# Patient Record
Sex: Male | Born: 1935
Health system: Southern US, Community
[De-identification: ages and names within clinical notes are randomized; demographics above are authoritative.]

## PROBLEM LIST (undated history)

## (undated) DIAGNOSIS — L309 Dermatitis, unspecified: Secondary | ICD-10-CM

## (undated) DIAGNOSIS — Z9289 Personal history of other medical treatment: Secondary | ICD-10-CM

## (undated) DIAGNOSIS — R32 Unspecified urinary incontinence: Secondary | ICD-10-CM

## (undated) DIAGNOSIS — C61 Malignant neoplasm of prostate: Secondary | ICD-10-CM

## (undated) DIAGNOSIS — C189 Malignant neoplasm of colon, unspecified: Secondary | ICD-10-CM

## (undated) DIAGNOSIS — K5792 Diverticulitis of intestine, part unspecified, without perforation or abscess without bleeding: Secondary | ICD-10-CM

## (undated) DIAGNOSIS — N183 Chronic kidney disease, stage 3 unspecified: Secondary | ICD-10-CM

## (undated) DIAGNOSIS — L719 Rosacea, unspecified: Secondary | ICD-10-CM

## (undated) DIAGNOSIS — R339 Retention of urine, unspecified: Secondary | ICD-10-CM

## (undated) DIAGNOSIS — M1990 Unspecified osteoarthritis, unspecified site: Secondary | ICD-10-CM

## (undated) DIAGNOSIS — M109 Gout, unspecified: Secondary | ICD-10-CM

## (undated) DIAGNOSIS — M5430 Sciatica, unspecified side: Secondary | ICD-10-CM

## (undated) DIAGNOSIS — I2699 Other pulmonary embolism without acute cor pulmonale: Secondary | ICD-10-CM

## (undated) DIAGNOSIS — I739 Peripheral vascular disease, unspecified: Secondary | ICD-10-CM

## (undated) DIAGNOSIS — Z972 Presence of dental prosthetic device (complete) (partial): Secondary | ICD-10-CM

## (undated) DIAGNOSIS — K649 Unspecified hemorrhoids: Secondary | ICD-10-CM

## (undated) DIAGNOSIS — I10 Essential (primary) hypertension: Secondary | ICD-10-CM

## (undated) DIAGNOSIS — Z973 Presence of spectacles and contact lenses: Secondary | ICD-10-CM

## (undated) HISTORY — DX: Other pulmonary embolism without acute cor pulmonale: I26.99

## (undated) HISTORY — DX: Sciatica, unspecified side: M54.30

## (undated) HISTORY — DX: Essential (primary) hypertension: I10

## (undated) HISTORY — DX: Unspecified urinary incontinence: R32

## (undated) HISTORY — DX: Dermatitis, unspecified: L30.9

## (undated) HISTORY — DX: Retention of urine, unspecified: R33.9

## (undated) HISTORY — PX: SHOULDER SURGERY: SHX246

## (undated) HISTORY — DX: Chronic kidney disease, stage 3 unspecified: N18.30

## (undated) HISTORY — DX: Malignant neoplasm of colon, unspecified: C18.9

## (undated) HISTORY — DX: Rosacea, unspecified: L71.9

## (undated) HISTORY — PX: ILEOSTOMY: SHX1783

## (undated) HISTORY — PX: OTHER SURGICAL HISTORY: SHX169

## (undated) HISTORY — PX: TONSILLECTOMY: SUR1361

## (undated) HISTORY — PX: COLON SURGERY: SHX602

## (undated) HISTORY — PX: EYE SURGERY: SHX253

---

## 1990-10-23 HISTORY — PX: OTHER SURGICAL HISTORY: SHX169

## 1997-10-23 HISTORY — PX: HERNIA REPAIR: SHX51

## 1999-07-12 ENCOUNTER — Other Ambulatory Visit: Admission: RE | Admit: 1999-07-12 | Discharge: 1999-07-12 | Payer: Self-pay

## 2000-12-31 ENCOUNTER — Encounter (INDEPENDENT_AMBULATORY_CARE_PROVIDER_SITE_OTHER): Payer: Self-pay | Admitting: *Deleted

## 2000-12-31 ENCOUNTER — Ambulatory Visit (HOSPITAL_COMMUNITY): Admission: RE | Admit: 2000-12-31 | Discharge: 2000-12-31 | Payer: Self-pay | Admitting: Gastroenterology

## 2001-08-15 ENCOUNTER — Ambulatory Visit: Admission: RE | Admit: 2001-08-15 | Discharge: 2001-11-13 | Payer: Self-pay | Admitting: Urology

## 2001-11-14 ENCOUNTER — Ambulatory Visit: Admission: RE | Admit: 2001-11-14 | Discharge: 2002-02-12 | Payer: Self-pay | Admitting: Radiation Oncology

## 2002-11-10 ENCOUNTER — Ambulatory Visit: Admission: RE | Admit: 2002-11-10 | Discharge: 2002-11-10 | Payer: Self-pay

## 2003-10-26 ENCOUNTER — Encounter: Admission: RE | Admit: 2003-10-26 | Discharge: 2003-10-26 | Payer: Self-pay

## 2003-11-09 ENCOUNTER — Encounter: Admission: RE | Admit: 2003-11-09 | Discharge: 2003-11-09 | Payer: Self-pay

## 2003-11-17 ENCOUNTER — Encounter (INDEPENDENT_AMBULATORY_CARE_PROVIDER_SITE_OTHER): Payer: Self-pay | Admitting: *Deleted

## 2003-11-17 ENCOUNTER — Ambulatory Visit (HOSPITAL_COMMUNITY): Admission: RE | Admit: 2003-11-17 | Discharge: 2003-11-17 | Payer: Self-pay | Admitting: Gastroenterology

## 2003-12-08 ENCOUNTER — Encounter: Admission: RE | Admit: 2003-12-08 | Discharge: 2003-12-08 | Payer: Self-pay

## 2006-08-13 ENCOUNTER — Ambulatory Visit: Payer: Self-pay | Admitting: Internal Medicine

## 2006-08-13 LAB — CONVERTED CEMR LAB
CO2: 30 meq/L (ref 19–32)
Chol/HDL Ratio, serum: 4.5
Cholesterol: 151 mg/dL (ref 0–200)
Glomerular Filtration Rate, Af Am: 59 mL/min/{1.73_m2}
Glucose, Bld: 97 mg/dL (ref 70–99)
Potassium: 3.8 meq/L (ref 3.5–5.1)
Triglyceride fasting, serum: 99 mg/dL (ref 0–149)

## 2006-11-16 ENCOUNTER — Ambulatory Visit: Payer: Self-pay | Admitting: Internal Medicine

## 2006-11-16 LAB — CONVERTED CEMR LAB
ALT: 26 units/L (ref 0–40)
AST: 23 units/L (ref 0–37)
BUN: 26 mg/dL — ABNORMAL HIGH (ref 6–23)
GFR calc Af Amer: 55 mL/min
LDL Cholesterol: 92 mg/dL (ref 0–99)
Potassium: 3.9 meq/L (ref 3.5–5.1)
Total CHOL/HDL Ratio: 4.2
Triglycerides: 107 mg/dL (ref 0–149)
VLDL: 21 mg/dL (ref 0–40)

## 2007-01-23 ENCOUNTER — Encounter: Payer: Self-pay | Admitting: Internal Medicine

## 2007-01-23 ENCOUNTER — Ambulatory Visit: Payer: Self-pay | Admitting: Internal Medicine

## 2007-01-23 DIAGNOSIS — K222 Esophageal obstruction: Secondary | ICD-10-CM

## 2007-01-23 DIAGNOSIS — Z8546 Personal history of malignant neoplasm of prostate: Secondary | ICD-10-CM

## 2007-04-17 ENCOUNTER — Ambulatory Visit: Payer: Self-pay | Admitting: Internal Medicine

## 2007-04-17 ENCOUNTER — Encounter: Admission: RE | Admit: 2007-04-17 | Discharge: 2007-04-17 | Payer: Self-pay | Admitting: Internal Medicine

## 2007-04-17 DIAGNOSIS — R05 Cough: Secondary | ICD-10-CM | POA: Insufficient documentation

## 2007-04-19 LAB — CONVERTED CEMR LAB
Chloride: 102 meq/L (ref 96–112)
GFR calc Af Amer: 55 mL/min
GFR calc non Af Amer: 45 mL/min
Glucose, Bld: 81 mg/dL (ref 70–99)
Sodium: 141 meq/L (ref 135–145)

## 2007-07-25 ENCOUNTER — Encounter: Payer: Self-pay | Admitting: Internal Medicine

## 2008-05-11 ENCOUNTER — Ambulatory Visit: Payer: Self-pay | Admitting: Internal Medicine

## 2008-05-11 DIAGNOSIS — N189 Chronic kidney disease, unspecified: Secondary | ICD-10-CM

## 2008-05-11 DIAGNOSIS — I10 Essential (primary) hypertension: Secondary | ICD-10-CM | POA: Insufficient documentation

## 2008-05-11 DIAGNOSIS — M109 Gout, unspecified: Secondary | ICD-10-CM

## 2008-05-12 ENCOUNTER — Ambulatory Visit: Payer: Self-pay | Admitting: Internal Medicine

## 2008-05-18 LAB — CONVERTED CEMR LAB
BUN: 19 mg/dL (ref 6–23)
Basophils Relative: 0.3 % (ref 0.0–3.0)
CO2: 28 meq/L (ref 19–32)
Chloride: 105 meq/L (ref 96–112)
Creatinine, Ser: 1.4 mg/dL (ref 0.4–1.5)
Eosinophils Absolute: 0.2 10*3/uL (ref 0.0–0.7)
Eosinophils Relative: 4.6 % (ref 0.0–5.0)
Glucose, Bld: 98 mg/dL (ref 70–99)
Lymphocytes Relative: 22.9 % (ref 12.0–46.0)
MCV: 98.9 fL (ref 78.0–100.0)
Monocytes Relative: 7 % (ref 3.0–12.0)
Neutrophils Relative %: 65.2 % (ref 43.0–77.0)
RBC: 4.42 M/uL (ref 4.22–5.81)
VLDL: 13 mg/dL (ref 0–40)
WBC: 5.4 10*3/uL (ref 4.5–10.5)

## 2008-06-25 ENCOUNTER — Ambulatory Visit: Payer: Self-pay | Admitting: Internal Medicine

## 2008-11-06 ENCOUNTER — Ambulatory Visit: Payer: Self-pay | Admitting: Family Medicine

## 2008-11-18 ENCOUNTER — Encounter: Payer: Self-pay | Admitting: Internal Medicine

## 2008-12-01 ENCOUNTER — Encounter: Payer: Self-pay | Admitting: Internal Medicine

## 2008-12-02 ENCOUNTER — Encounter: Payer: Self-pay | Admitting: Internal Medicine

## 2008-12-04 ENCOUNTER — Ambulatory Visit (HOSPITAL_COMMUNITY): Admission: RE | Admit: 2008-12-04 | Discharge: 2008-12-04 | Payer: Self-pay | Admitting: Urology

## 2008-12-04 ENCOUNTER — Encounter: Payer: Self-pay | Admitting: Internal Medicine

## 2008-12-21 ENCOUNTER — Ambulatory Visit (HOSPITAL_COMMUNITY): Admission: RE | Admit: 2008-12-21 | Discharge: 2008-12-21 | Payer: Self-pay | Admitting: Gastroenterology

## 2008-12-21 ENCOUNTER — Encounter (INDEPENDENT_AMBULATORY_CARE_PROVIDER_SITE_OTHER): Payer: Self-pay | Admitting: Gastroenterology

## 2009-01-20 ENCOUNTER — Telehealth (INDEPENDENT_AMBULATORY_CARE_PROVIDER_SITE_OTHER): Payer: Self-pay | Admitting: *Deleted

## 2009-03-30 ENCOUNTER — Emergency Department (HOSPITAL_COMMUNITY): Admission: EM | Admit: 2009-03-30 | Discharge: 2009-03-30 | Payer: Self-pay | Admitting: Emergency Medicine

## 2009-04-07 ENCOUNTER — Encounter: Admission: RE | Admit: 2009-04-07 | Discharge: 2009-04-07 | Payer: Self-pay | Admitting: Gastroenterology

## 2009-04-16 ENCOUNTER — Encounter: Payer: Self-pay | Admitting: Internal Medicine

## 2009-05-12 ENCOUNTER — Ambulatory Visit: Payer: Self-pay | Admitting: Internal Medicine

## 2009-05-12 DIAGNOSIS — M2550 Pain in unspecified joint: Secondary | ICD-10-CM | POA: Insufficient documentation

## 2009-05-12 DIAGNOSIS — L719 Rosacea, unspecified: Secondary | ICD-10-CM

## 2009-05-13 ENCOUNTER — Ambulatory Visit: Payer: Self-pay | Admitting: Internal Medicine

## 2009-05-14 LAB — CONVERTED CEMR LAB
Rhuematoid fact SerPl-aCnc: <20 intl units/mL (ref 0.0–20.0)
Sed Rate: 7 mm/hr (ref 0–22)
Uric Acid, Serum: 5.7 mg/dL (ref 4.0–7.8)

## 2009-05-17 ENCOUNTER — Encounter (INDEPENDENT_AMBULATORY_CARE_PROVIDER_SITE_OTHER): Payer: Self-pay | Admitting: *Deleted

## 2009-05-19 ENCOUNTER — Telehealth: Payer: Self-pay | Admitting: Internal Medicine

## 2009-06-21 ENCOUNTER — Ambulatory Visit: Payer: Self-pay | Admitting: Internal Medicine

## 2009-06-21 DIAGNOSIS — K5730 Diverticulosis of large intestine without perforation or abscess without bleeding: Secondary | ICD-10-CM | POA: Insufficient documentation

## 2009-06-22 ENCOUNTER — Ambulatory Visit: Payer: Self-pay | Admitting: Internal Medicine

## 2009-06-29 ENCOUNTER — Encounter: Payer: Self-pay | Admitting: Internal Medicine

## 2009-06-30 ENCOUNTER — Encounter (INDEPENDENT_AMBULATORY_CARE_PROVIDER_SITE_OTHER): Payer: Self-pay | Admitting: *Deleted

## 2009-06-30 LAB — CONVERTED CEMR LAB
ALT: 18 units/L (ref 0–53)
AST: 20 units/L (ref 0–37)
BUN: 20 mg/dL (ref 6–23)
Basophils Absolute: 0 10*3/uL (ref 0.0–0.1)
Basophils Relative: 0.1 % (ref 0.0–3.0)
CO2: 26 meq/L (ref 19–32)
Calcium: 9.7 mg/dL (ref 8.4–10.5)
Cholesterol: 123 mg/dL (ref 0–200)
Creatinine, Ser: 1.3 mg/dL (ref 0.4–1.5)
Eosinophils Absolute: 0.2 10*3/uL (ref 0.0–0.7)
HCT: 43.4 % (ref 39.0–52.0)
Hemoglobin: 14.3 g/dL (ref 13.0–17.0)
Lymphs Abs: 1.2 10*3/uL (ref 0.7–4.0)
MCHC: 33 g/dL (ref 30.0–36.0)
Neutro Abs: 6.2 10*3/uL (ref 1.4–7.7)
RBC: 4.45 M/uL (ref 4.22–5.81)
RDW: 13.7 % (ref 11.5–14.6)
Triglycerides: 74 mg/dL (ref 0.0–149.0)

## 2009-07-01 ENCOUNTER — Encounter (INDEPENDENT_AMBULATORY_CARE_PROVIDER_SITE_OTHER): Payer: Self-pay | Admitting: *Deleted

## 2009-07-13 ENCOUNTER — Encounter: Payer: Self-pay | Admitting: Internal Medicine

## 2009-08-06 ENCOUNTER — Encounter: Payer: Self-pay | Admitting: Internal Medicine

## 2009-08-23 ENCOUNTER — Encounter: Admission: RE | Admit: 2009-08-23 | Discharge: 2009-08-23 | Payer: Self-pay | Admitting: General Surgery

## 2009-09-03 ENCOUNTER — Encounter: Payer: Self-pay | Admitting: Internal Medicine

## 2009-09-24 ENCOUNTER — Encounter: Payer: Self-pay | Admitting: Internal Medicine

## 2009-09-29 ENCOUNTER — Encounter (HOSPITAL_COMMUNITY): Admission: RE | Admit: 2009-09-29 | Discharge: 2009-10-22 | Payer: Self-pay | Admitting: Urology

## 2009-10-11 ENCOUNTER — Encounter: Payer: Self-pay | Admitting: Internal Medicine

## 2009-10-12 ENCOUNTER — Telehealth (INDEPENDENT_AMBULATORY_CARE_PROVIDER_SITE_OTHER): Payer: Self-pay | Admitting: *Deleted

## 2009-10-23 DIAGNOSIS — I739 Peripheral vascular disease, unspecified: Secondary | ICD-10-CM

## 2009-10-23 HISTORY — DX: Peripheral vascular disease, unspecified: I73.9

## 2009-10-27 ENCOUNTER — Encounter: Payer: Self-pay | Admitting: Internal Medicine

## 2009-10-28 ENCOUNTER — Encounter: Admission: RE | Admit: 2009-10-28 | Discharge: 2009-10-28 | Payer: Self-pay | Admitting: General Surgery

## 2009-11-02 ENCOUNTER — Encounter (INDEPENDENT_AMBULATORY_CARE_PROVIDER_SITE_OTHER): Payer: Self-pay | Admitting: General Surgery

## 2009-11-02 ENCOUNTER — Inpatient Hospital Stay (HOSPITAL_COMMUNITY): Admission: RE | Admit: 2009-11-02 | Discharge: 2009-11-08 | Payer: Self-pay | Admitting: General Surgery

## 2009-11-18 ENCOUNTER — Encounter: Payer: Self-pay | Admitting: Internal Medicine

## 2009-12-06 ENCOUNTER — Encounter: Admission: RE | Admit: 2009-12-06 | Discharge: 2009-12-06 | Payer: Self-pay | Admitting: General Surgery

## 2009-12-20 ENCOUNTER — Encounter: Admission: RE | Admit: 2009-12-20 | Discharge: 2009-12-20 | Payer: Self-pay | Admitting: General Surgery

## 2009-12-21 ENCOUNTER — Encounter: Payer: Self-pay | Admitting: Internal Medicine

## 2010-01-26 ENCOUNTER — Encounter: Payer: Self-pay | Admitting: Internal Medicine

## 2010-02-04 ENCOUNTER — Encounter: Admission: RE | Admit: 2010-02-04 | Discharge: 2010-02-04 | Payer: Self-pay | Admitting: General Surgery

## 2010-02-11 ENCOUNTER — Encounter: Payer: Self-pay | Admitting: Internal Medicine

## 2010-03-24 ENCOUNTER — Encounter: Admission: RE | Admit: 2010-03-24 | Discharge: 2010-03-24 | Payer: Self-pay | Admitting: General Surgery

## 2010-04-07 ENCOUNTER — Encounter: Payer: Self-pay | Admitting: Internal Medicine

## 2010-04-13 ENCOUNTER — Ambulatory Visit (HOSPITAL_COMMUNITY): Admission: RE | Admit: 2010-04-13 | Discharge: 2010-04-13 | Payer: Self-pay | Admitting: General Surgery

## 2010-04-20 ENCOUNTER — Ambulatory Visit (HOSPITAL_COMMUNITY): Admission: RE | Admit: 2010-04-20 | Discharge: 2010-04-20 | Payer: Self-pay | Admitting: General Surgery

## 2010-05-10 ENCOUNTER — Telehealth (INDEPENDENT_AMBULATORY_CARE_PROVIDER_SITE_OTHER): Payer: Self-pay | Admitting: *Deleted

## 2010-05-10 ENCOUNTER — Encounter: Admission: RE | Admit: 2010-05-10 | Discharge: 2010-05-10 | Payer: Self-pay | Admitting: General Surgery

## 2010-05-18 ENCOUNTER — Ambulatory Visit (HOSPITAL_COMMUNITY): Admission: RE | Admit: 2010-05-18 | Discharge: 2010-05-18 | Payer: Self-pay | Admitting: General Surgery

## 2010-05-18 ENCOUNTER — Encounter: Payer: Self-pay | Admitting: Internal Medicine

## 2010-05-24 ENCOUNTER — Encounter: Payer: Self-pay | Admitting: Internal Medicine

## 2010-05-24 ENCOUNTER — Telehealth: Payer: Self-pay | Admitting: Internal Medicine

## 2010-05-25 ENCOUNTER — Encounter: Admission: RE | Admit: 2010-05-25 | Discharge: 2010-05-25 | Payer: Self-pay | Admitting: Internal Medicine

## 2010-05-26 ENCOUNTER — Encounter: Payer: Self-pay | Admitting: Internal Medicine

## 2010-05-26 LAB — CONVERTED CEMR LAB
ALT: 23 units/L
AST: 23 units/L
Alkaline Phosphatase: 80 units/L
BUN: 37 mg/dL
Creatinine, Ser: 2.07 mg/dL

## 2010-05-30 ENCOUNTER — Telehealth: Payer: Self-pay | Admitting: Internal Medicine

## 2010-06-03 ENCOUNTER — Ambulatory Visit: Payer: Self-pay | Admitting: Internal Medicine

## 2010-06-03 LAB — CONVERTED CEMR LAB
Basophils Absolute: 0 10*3/uL (ref 0.0–0.1)
Basophils Relative: 0 % (ref 0–1)
CO2: 25 meq/L (ref 19–32)
Chloride: 107 meq/L (ref 96–112)
Hemoglobin: 11.8 g/dL — ABNORMAL LOW (ref 13.0–17.0)
Lymphocytes Relative: 15 % (ref 12–46)
MCHC: 32.7 g/dL (ref 30.0–36.0)
Monocytes Absolute: 0.5 10*3/uL (ref 0.1–1.0)
Neutro Abs: 5.6 10*3/uL (ref 1.7–7.7)
Neutrophils Relative %: 74 % (ref 43–77)
Potassium: 3.9 meq/L (ref 3.5–5.3)
RDW: 14.9 % (ref 11.5–15.5)
Sodium: 138 meq/L (ref 135–145)

## 2010-06-06 ENCOUNTER — Telehealth: Payer: Self-pay | Admitting: Internal Medicine

## 2010-06-20 ENCOUNTER — Encounter: Payer: Self-pay | Admitting: Internal Medicine

## 2010-06-28 ENCOUNTER — Encounter (INDEPENDENT_AMBULATORY_CARE_PROVIDER_SITE_OTHER): Payer: Self-pay | Admitting: General Surgery

## 2010-06-28 ENCOUNTER — Inpatient Hospital Stay (HOSPITAL_COMMUNITY): Admission: RE | Admit: 2010-06-28 | Discharge: 2010-07-02 | Payer: Self-pay | Admitting: General Surgery

## 2010-07-12 ENCOUNTER — Encounter: Payer: Self-pay | Admitting: Internal Medicine

## 2010-08-25 ENCOUNTER — Encounter: Payer: Self-pay | Admitting: Internal Medicine

## 2010-09-09 ENCOUNTER — Encounter: Payer: Self-pay | Admitting: Internal Medicine

## 2010-09-30 ENCOUNTER — Ambulatory Visit: Payer: Self-pay | Admitting: Internal Medicine

## 2010-09-30 DIAGNOSIS — R229 Localized swelling, mass and lump, unspecified: Secondary | ICD-10-CM

## 2010-10-07 ENCOUNTER — Encounter: Payer: Self-pay | Admitting: Internal Medicine

## 2010-11-02 ENCOUNTER — Encounter: Payer: Self-pay | Admitting: Internal Medicine

## 2010-11-11 ENCOUNTER — Encounter
Admission: RE | Admit: 2010-11-11 | Discharge: 2010-11-11 | Payer: Self-pay | Source: Home / Self Care | Attending: General Surgery | Admitting: General Surgery

## 2010-11-13 ENCOUNTER — Encounter: Payer: Self-pay | Admitting: General Surgery

## 2010-11-24 NOTE — Progress Notes (Signed)
Summary: renal failure, needs workup  Phone Note Outgoing Call   Summary of Call: phone call fromDr. Derrell Lolling, surgery The patient is in need for reverse colostomy He obtained a BNP, his creatinine was 2.0 Compared to previous labs a year ago, the creatinine has increased from a baseline of 1.3. Plan: Renal ultrasound ASAP hold enalapril Schedule the patient to see me this week or next week we mainly to hold the elective reversion of his colostomy until we figure out his renal failure [name] E. Paz MD  May 24, 2010 9:27 AM     Follow-up for Phone Call        Pt is aware of all instructions. He is going to get his [place] and make an appt to see Dr. Drue Novel. will hold enalapril.    New/Updated Medications: ENALAPRIL MALEATE 10 MG  TABS (ENALAPRIL MALEATE) 1 by mouth once daily- HOLD

## 2010-11-24 NOTE — Progress Notes (Signed)
Summary: FOUR 90 DAY PRESCRIPTIONS  Phone Note Call from Patient Call back at Home Phone [phone]   Caller: Patient Summary of Call: PATIENT DROPPED OFF [facility] FORMS FOR 90 DAY PRESCRIPTIONS PLUS REFILLS FOR :  1) ALLOPURINOL 300 MG 2) ENALAPRIL MAL TABS 10MG  3) POT CHLOR ER TABS 20 MEQ 4) HYDROCHLOROTHIAZIDE TABS 50 MG  WILL TAKE PAPERWORK TO [facility] IN PLASTIC SLEEVE   Initial call taken by: Jerolyn Shin,  May 10, 2010 11:51 AM  Follow-up for Phone Call        Pt has not been seen since 05/2009. Army Fossa CMA  May 10, 2010 12:42 PM  okay to refill 90 days  x1. Needs a followup with me within 3 months Jose E. Paz MD  May 11, 2010 10:18 AM   Additional Follow-up for Phone Call Additional follow up Details #1::        lmtcb.Lavell Islam  May 11, 2010 10:36 AM    Additional Follow-up for Phone Call Additional follow up Details #2::    lmtcb. will mail letter.Lavell Islam  May 12, 2010 9:52 AM  Additional Follow-up for Phone Call Additional follow up Details #3:: Details for Additional Follow-up Action Taken: Spoke with patient, he is aware that rx's were sent in for the next 3 months and he made an appt on come in on 8.18.11.  Additional Follow-up by: Harold Barban,  May 12, 2010 11:04 AM  Prescriptions: ALLOPURINOL 300 MG TABS (ALLOPURINOL) Take 1 tablet by mouth once a day  #90 x 0   Entered by:   Army Fossa CMA   Authorized by:   Nolon Rod. Paz MD   Signed by:   Army Fossa CMA on 05/11/2010   Method used:   Faxed to ...       [facility] (mail-order)             , [place]         Ph: [phone]       Fax: [phone]   RxID:   [id number] KLOR-CON M20   TBCR (POTASSIUM CHLORIDE CRYS CR TBCR) 1 by mouth once daily  #90 x 0   Entered by:   Army Fossa CMA   Authorized by:   Nolon Rod. Paz MD   Signed by:   Army Fossa CMA on 05/11/2010   Method used:   Faxed to ...       [facility] (mail-order)             , [place]         Ph:  [phone]       Fax: [phone]   RxID:   [id number] HYDROCHLOROTHIAZIDE 50 MG TABS (HYDROCHLOROTHIAZIDE) 1 tablet by mouth once a day  #90 x 0   Entered by:   Army Fossa CMA   Authorized by:   Nolon Rod. Paz MD   Signed by:   Army Fossa CMA on 05/11/2010   Method used:   Faxed to ...       [facility] (mail-order)             , [place]         Ph: [phone]       Fax: [phone]   RxID:   [id number] ENALAPRIL MALEATE 10 MG  TABS (ENALAPRIL MALEATE) 1 by mouth qd  #90 x 0   Entered by:   Army Fossa CMA   Authorized by:   Nolon Rod. Paz MD   Signed by:   Duwayne Heck  Barmer CMA on 05/11/2010   Method used:   Faxed to ...       [facility] (mail-order)             , [place]         Ph: [phone]       Fax: [phone]   RxID:   [id number]

## 2010-11-24 NOTE — Consult Note (Signed)
Summary: ganglion cyst, aspiration, steroid injection- [facility]   Imported By: Lanelle Bal 10/20/2010 11:14:18  _____________________________________________________________________  External Attachment:    Type:   Image     Comment:   External Document

## 2010-11-24 NOTE — Progress Notes (Signed)
Summary: Results   Phone Note Call from Patient Call back at Home Phone [phone]   Caller: Patient Call For: Ahmeek E. Paz MD Reason for Call: Lab or Test Results Action Taken: Appt Scheduled Today Summary of Call: Please call with ultrasound results Initial call taken by: Barnie Mort,  May 30, 2010 8:14 AM  Follow-up for Phone Call        advice patient Ultrasound is okay He has an appointment with me in 10 days, we can move that appointment sooner ( like for Friday)  because he is in need to be cleared for surgery Follow-up by: [facility] E. Paz MD,  May 30, 2010 1:29 PM  Additional Follow-up for Phone Call Additional follow up Details #1::        left message for pt to call back. Army Fossa CMA  May 30, 2010 1:33 PM     Additional Follow-up for Phone Call Additional follow up Details #2::    spoke with pt, pt is coming in on friday. Army Fossa CMA  May 30, 2010 2:01 PM

## 2010-11-24 NOTE — Letter (Signed)
Summary: [facility]   Imported By: Lanelle Bal 12/01/2009 12:59:07  _____________________________________________________________________  External Attachment:    Type:   Image     Comment:   External Document

## 2010-11-24 NOTE — Letter (Signed)
Summary: prostate ca f/u----[facility]   Imported By: Sherian Rein 07/01/2010 07:20:20  _____________________________________________________________________  External Attachment:    Type:   Image     Comment:   External Document

## 2010-11-24 NOTE — Miscellaneous (Signed)
Summary: Labs  Clinical Lists Changes  Observations: Added new observation of ALK PHOS: 80 units/L (05/26/2010 10:20) Added new observation of SGPT (ALT): 23 units/L (05/26/2010 10:20) Added new observation of SGOT (AST): 23 units/L (05/26/2010 10:20) Added new observation of CALCIUM: 11.3 mg/dL (16/07/9603 54:09) Added new observation of CREATININE: 2.07 mg/dL (81/19/1478 29:56) Added new observation of BUN: 37 mg/dL (21/30/8657 84:69) Added new observation of POTASSIUM: 3.8 mmol/L (05/26/2010 10:20) Added new observation of SODIUM: 140 mmol/L (05/26/2010 10:20)  Done on July 26,2011 not 05/26/10. Army Fossa CMA  May 26, 2010 10:22 AM

## 2010-11-24 NOTE — Letter (Signed)
Summary: post-op f/u doing well----Surgery  [facility]   Imported By: Lanelle Bal 08/03/2010 10:58:57  _____________________________________________________________________  External Attachment:    Type:   Image     Comment:   External Document

## 2010-11-24 NOTE — Assessment & Plan Note (Signed)
Summary: follow up/drb   Vital Signs:  Patient profile:   77 year old male Height:      70.75 inches Weight:      190.13 pounds BMI:     26.80 Pulse rate:   99 / minute Pulse rhythm:   regular BP sitting:   126 / 82  (left arm) Cuff size:   large  Vitals Entered By: Army Fossa CMA (June 03, 2010 3:45 PM) CC: Follow up, Surgical Clearance   History of Present Illness: last office visit a year ago Here for surgical clearance several things have happened since the last office visit. Chart reviewed.  --due to recurrent  diverticulitis, was refered to  surgery for elective colectomy --on 11-02-09 he had the following surgeries: 1. Cystoscopy with dilatation of urethral stricture and insertion of     Foley catheter (Dr. Jethro Bolus). 2. Laparoscopic-assisted sigmoid colectomy, takedown of splenic     flexure, loop ileostomy.  -- postop-course was complicated by a contained leak at the anastomosis  --his creatinine on 05-2009 was 1.3,  at the time of her surgery on January, creatinine was 1.2. The creatinine was rechecked on July  , 2011  as preop for reversal of the colectomy and was found to be 2.0 Elective reversal of the colectomy was canceled Ultrasound done 05-25-10 showing no renal obstruction, possibly medical renal disease The patient was instructed to hold ACE inhibitors  Allergies: 1)  * Sulfa (Sulfonamides) Group  Past History:  Past Medical History: Reviewed history from 06/21/2009 and no changes required. Gout--sees Dr Phylliss Bob Osteoarthritis, mostly and the knees---sees Dr Phylliss Bob Anemia-NOS Diverticulitis, hx of HTN CHRONIC  KIDNEY DISEASE, CREAT 1.6 (2008) ESOPHAGEAL STRICTURE  ADENOCARCINOMA, PROSTATE, Dx 1992 Dr Page Spiro renal cysts (U/S and MRI  11-2008 per Dr Patsi Sears)  Review of Systems       in general the patient feels wells He desires to reverse the colectomy as  soon as possible denies fever, nausea, vomiting Is not taking  NSAIDs Denies dysuria or gross nocturia He keep himself hydrated very well reports that his BP was slightly low in the acute after surgery but then the BPs were normal. He is holding ACE inhibitor for several days, his BP today is within normal .  Physical Exam  General:  alert, well-developed, and well-nourished.   Lungs:  normal respiratory effort, no intercostal retractions, no accessory muscle use, and normal breath sounds.   Heart:  normal rate, regular rhythm, no murmur, and no gallop.   Abdomen:  soft, non-tender, normal bowel sounds, and no distention.  colectomy in place Extremities:  number lower extremity edema Neurologic:  alert & oriented X3.     Impression & Recommendations:  Problem # 1:  HYPERTENSION (ICD-401.9) BP well controlled, off ACE inhibitors for few days add a  low dose of carvedilol The following medications were removed from the medication list:    Enalapril Maleate 10 Mg Tabs (Enalapril maleate) .Marland Kitchen... 1 by mouth once daily- hold His updated medication list for this problem includes:    Hydrochlorothiazide 50 Mg Tabs (Hydrochlorothiazide) .Marland Kitchen... 1 tablet by mouth once a day    Carvedilol 6.25 Mg Tabs (Carvedilol) .Marland Kitchen... 1 by mouth two times a day  Problem # 2:  KIDNEY DISEASE, CHRONIC NOS (ICD-585.9) creatinine  previously  stable at around 1.2, found to be 2.0 a few days ago. since then, a renal ultrasound was negative, he is holding the ACE inhibitors Plan: BMP and CBC, his creatinine back to baseline,  he is clear for surgery. If renal insufficiency persists, will discuss with nephrology  Orders: Venipuncture (04540) Specimen Handling (98119) Specimen Handling (14782)  Problem # 3:  addendum labs results are back Creatinine is 1.6 which is close to his baseline Hemoglobin is a stable, slightly low, that is to be rechecked in a few months PATIENT CLEARED FOR SURGERY Will avoid ACE inhibitor- ARB is in the future avoid NSAIDs and iv  contrast Close followup  of  blood pressure during the peri-operative time called Dr Derrell Lolling, asked to  return my call (case informaly discussed w/ nephrology)  Complete Medication List: 1)  Hydrochlorothiazide 50 Mg Tabs (Hydrochlorothiazide) .Marland Kitchen.. 1 tablet by mouth once a day 2)  Klor-con M20 Tbcr (Potassium chloride crys cr tbcr) .Marland Kitchen.. 1 by mouth once daily 3)  Allopurinol 300 Mg Tabs (Allopurinol) .... Take 1 tablet by mouth once a day 4)  Sudafed Tabs (Pseudoephedrine hcl tabs) .... As needed bladder incontinence 5)  Multi-vitamin  6)  Carvedilol 6.25 Mg Tabs (Carvedilol) .Marland Kitchen.. 1 by mouth two times a day  Patient Instructions: 1)  Please schedule a follow-up appointment in 4 months .  Prescriptions: CARVEDILOL 6.25 MG TABS (CARVEDILOL) 1 by mouth two times a day  #60 x 1   Entered and Authorized by:   Elita Quick E. Nehal Shives MD   Signed by:   Nolon Rod. Venecia Mehl MD on 06/03/2010   Method used:   Electronically to        [id number] (retail)       [address]       [place], [place]  [address]       Ph: [phone] or [phone]       Fax: [phone]   RxID:   [id number]

## 2010-11-24 NOTE — Letter (Signed)
Summary: [facility]   Imported By: Lanelle Bal 01/05/2010 08:54:37  _____________________________________________________________________  External Attachment:    Type:   Image     Comment:   External Document

## 2010-11-24 NOTE — Letter (Signed)
Summary: planning surgery,[facility]   Imported By: Lanelle Bal 06/16/2010 13:54:44  _____________________________________________________________________  External Attachment:    Type:   Image     Comment:   External Document

## 2010-11-24 NOTE — Letter (Signed)
Summary: [facility]   Imported By: Lanelle Bal 02/10/2010 10:47:31  _____________________________________________________________________  External Attachment:    Type:   Image     Comment:   External Document

## 2010-11-24 NOTE — Miscellaneous (Signed)
Summary: Immunization Entry--Flu vacc   Immunization History:  Influenza Immunization History:    Influenza:  historical (09/08/2010) Patient received flu vacc at [facility] on 09-08-10. Lucious Groves CMA  September 09, 2010 9:10 AM

## 2010-11-24 NOTE — Letter (Signed)
Summary: s/p closure loop ileostomy--- [facility]   Imported By: Lanelle Bal 09/12/2010 11:25:32  _____________________________________________________________________  External Attachment:    Type:   Image     Comment:   External Document

## 2010-11-24 NOTE — Progress Notes (Signed)
Summary: cleared for surgery  Phone Note Outgoing Call   Summary of Call: notify patient -he is cleared for surgery -Followup with me in 2 months -Fax my note to surgery, attention Dr Spero Curb E. Paz MD  June 06, 2010 1:38 PM    Follow-up for Phone Call        Pt is aware. Army Fossa CMA  June 06, 2010 1:48 PM      Appended Document: cleared for surgery discussed with Dr. Derrell Lolling by phone

## 2010-11-24 NOTE — Assessment & Plan Note (Signed)
Summary: lump on shoulder/cbs   Vital Signs:  Patient profile:   77 year old male Weight:      193.25 pounds Pulse rate:   90 / minute Pulse rhythm:   regular BP sitting:   132 / 84  (left arm) Cuff size:   large  Vitals Entered By: Army Fossa CMA (September 30, 2010 9:23 AM) CC: Pt here c/o hard lump on shoulder. Comments Saw this am never noticied before [facility] [address] not fasting    History of Present Illness: woke up this morning with a lump in the left shoulder. The lump is not hurting Feels well, no other issues.  Review of systems No discharge or warmness in the area No fever   Current Medications (verified): 1)  Hydrochlorothiazide 50 Mg Tabs (Hydrochlorothiazide) .Marland Kitchen.. 1 Tablet By Mouth Once A Day 2)  Klor-Con M20   Tbcr (Potassium Chloride Crys Cr Tbcr) .Marland Kitchen.. 1 By Mouth Once Daily 3)  Allopurinol 300 Mg Tabs (Allopurinol) .... Take 1 Tablet By Mouth Once A Day 4)  Sudafed   Tabs (Pseudoephedrine Hcl Tabs) .... As Needed Bladder Incontinence 5)  Multi-Vitamin 6)  Carvedilol 6.25 Mg Tabs (Carvedilol) .Marland Kitchen.. 1 By Mouth Two Times A Day  Allergies (verified): 1)  * Sulfa (Sulfonamides) Group  Past History:  Past Medical History: Reviewed history from 06/21/2009 and no changes required. Gout--sees Dr Phylliss Bob Osteoarthritis, mostly and the knees---sees Dr Phylliss Bob Anemia-NOS Diverticulitis, hx of HTN CHRONIC  KIDNEY DISEASE, CREAT 1.6 (2008) ESOPHAGEAL STRICTURE  ADENOCARCINOMA, PROSTATE, Dx 1992 Dr Page Spiro renal cysts (U/S and MRI  11-2008 per Dr Patsi Sears)  Past Surgical History: Reviewed history from 05/11/2008 and no changes required. Hemorrhoidectomy   3 times Inguinal herniorrhaphy growth removed rt foot 10/2007  Social History: Reviewed history from 06/21/2009 and no changes required. Married 2 children, lost a son 2010 (Burkitt's lymphoma)  Tobacco-- quit 6 EOTH -- socially   Physical Exam  General:  alert, well-developed, and  well-nourished.   Neck:  no mass or lymphadenopathy in the neck or the supraclavicular area Msk:  he has a 3/4 inch mass on the left shoulder located over the a.c. joint. It seems  attached to the joint. No warm,no red, not tender, no fluctuant, has a soft consistency. Skin over the lump is  normal Extremities:  range of motion of the shoulders is normal Bicipital muscles seem intact B. No axillary lymphadenopathy on either side   Impression & Recommendations:  Problem # 1:  LOCALIZED SUPERFICIAL SWELLING MASS OR LUMP (ICD-782.2) lump in the L shoulder that apperared overnight ?ganglion cyst ?sebaceous cyst  I suspect this is a ganglion cyst given the location. I doubt there is an infectious process given the lack of fever, redness or warmness. Will refer him to orthopedic surgery for confirmation. see  instructions  Complete Medication List: 1)  Hydrochlorothiazide 50 Mg Tabs (Hydrochlorothiazide) .Marland Kitchen.. 1 tablet by mouth once a day 2)  Klor-con M20 Tbcr (Potassium chloride crys cr tbcr) .Marland Kitchen.. 1 by mouth once daily 3)  Allopurinol 300 Mg Tabs (Allopurinol) .... Take 1 tablet by mouth once a day 4)  Sudafed Tabs (Pseudoephedrine hcl tabs) .... As needed bladder incontinence 5)  Multi-vitamin  6)  Carvedilol 6.25 Mg Tabs (Carvedilol) .Marland Kitchen.. 1 by mouth two times a day 7)  Voltaren 1 % Gel (Diclofenac sodium) .... Apply every am.  Patient Instructions: 1)  will send you to see the orthopedic doctor 2)  call any time if the area  gets worse, red, warm or you have fever Prescriptions: VOLTAREN 1 % GEL (DICLOFENAC SODIUM) apply every am.  #1 tube x 1   Entered by:   Army Fossa CMA   Authorized by:   Nolon Rod. Paz MD   Signed by:   Army Fossa CMA on 09/30/2010   Method used:   Electronically to        [id number] (retail)       [address]       [place], [place]  [address]       Ph: [phone] or [phone]       Fax: [phone]    RxID:   [id number]    Orders Added: 1)  Est. Patient Level III [[address]]

## 2010-11-24 NOTE — Letter (Signed)
Summary: [facility]   Imported By: Lanelle Bal 05/02/2010 10:07:37  _____________________________________________________________________  External Attachment:    Type:   Image     Comment:   External Document

## 2010-11-24 NOTE — Letter (Signed)
Summary: Call a Nurse  Call a Nurse   Imported By: Lanelle Bal 02/21/2010 10:51:38  _____________________________________________________________________  External Attachment:    Type:   Image     Comment:   External Document

## 2010-11-25 NOTE — Letter (Signed)
Summary: [facility]   Imported By: Lanelle Bal 11/10/2009 13:03:44  _____________________________________________________________________  External Attachment:    Type:   Image     Comment:   External Document

## 2010-11-30 NOTE — Consult Note (Signed)
Summary: [facility]   Imported By: Lanelle Bal 11/21/2010 14:29:04  _____________________________________________________________________  External Attachment:    Type:   Image     Comment:   External Document

## 2010-12-08 ENCOUNTER — Telehealth (INDEPENDENT_AMBULATORY_CARE_PROVIDER_SITE_OTHER): Payer: Self-pay | Admitting: *Deleted

## 2010-12-09 ENCOUNTER — Ambulatory Visit: Payer: Self-pay | Admitting: Internal Medicine

## 2010-12-14 NOTE — Progress Notes (Signed)
Summary: New Primary Dr, Dr.Tisovec  Phone Note Call from Patient Call back at [facility] Phone [phone]   Caller: Patient Summary of Call: Patient called indicating he has a new primary Doctor, Dr.Tisovec and he no longer see's Dr.Paz and pending surgical clearance appointment for tomorrow at 11:00am needs to be canceled.  Noted and Canceled./Chrae [facility] CMA  December 08, 2010 4:26 PM      Appended Document: New Primary Dr, Dr.Tisovec Form from ortho was faxed to Dr. Wylene Simmer office.

## 2011-01-03 ENCOUNTER — Other Ambulatory Visit: Payer: Self-pay | Admitting: General Surgery

## 2011-01-03 ENCOUNTER — Encounter (HOSPITAL_COMMUNITY): Payer: Medicare Other

## 2011-01-03 ENCOUNTER — Ambulatory Visit (HOSPITAL_COMMUNITY)
Admission: RE | Admit: 2011-01-03 | Discharge: 2011-01-03 | Disposition: A | Discharge status: Home / Self Care | Payer: Medicare Other | Source: Ambulatory Visit | Attending: General Surgery | Admitting: General Surgery

## 2011-01-03 ENCOUNTER — Other Ambulatory Visit (HOSPITAL_COMMUNITY): Payer: Self-pay | Admitting: General Surgery

## 2011-01-03 DIAGNOSIS — M47814 Spondylosis without myelopathy or radiculopathy, thoracic region: Secondary | ICD-10-CM | POA: Insufficient documentation

## 2011-01-03 DIAGNOSIS — Z01818 Encounter for other preprocedural examination: Secondary | ICD-10-CM | POA: Insufficient documentation

## 2011-01-03 DIAGNOSIS — K439 Ventral hernia without obstruction or gangrene: Secondary | ICD-10-CM | POA: Insufficient documentation

## 2011-01-03 DIAGNOSIS — Z01812 Encounter for preprocedural laboratory examination: Secondary | ICD-10-CM | POA: Insufficient documentation

## 2011-01-03 LAB — URINALYSIS, ROUTINE W REFLEX MICROSCOPIC
Bilirubin Urine: NEGATIVE
Glucose, UA: NEGATIVE mg/dL
Hgb urine dipstick: NEGATIVE
Protein, ur: NEGATIVE mg/dL
Urobilinogen, UA: 1 mg/dL (ref 0.0–1.0)

## 2011-01-03 LAB — COMPREHENSIVE METABOLIC PANEL
ALT: 19 U/L (ref 0–53)
AST: 20 U/L (ref 0–37)
CO2: 30 mEq/L (ref 19–32)
Chloride: 104 mEq/L (ref 96–112)
Creatinine, Ser: 1.52 mg/dL — ABNORMAL HIGH (ref 0.4–1.5)
GFR calc Af Amer: 54 mL/min — ABNORMAL LOW (ref >60)
GFR calc non Af Amer: 45 mL/min — ABNORMAL LOW (ref >60)
Glucose, Bld: 98 mg/dL (ref 70–99)
Sodium: 140 mEq/L (ref 135–145)
Total Bilirubin: 0.7 mg/dL (ref 0.3–1.2)

## 2011-01-03 LAB — DIFFERENTIAL
Basophils Relative: 0 % (ref 0–1)
Eosinophils Absolute: 0.3 10*3/uL (ref 0.0–0.7)
Lymphs Abs: 1.1 10*3/uL (ref 0.7–4.0)
Monocytes Absolute: 0.4 10*3/uL (ref 0.1–1.0)
Monocytes Relative: 6 % (ref 3–12)
Neutrophils Relative %: 74 % (ref 43–77)

## 2011-01-03 LAB — CBC
HCT: 41.9 % (ref 39.0–52.0)
Hemoglobin: 13.6 g/dL (ref 13.0–17.0)
MCH: 30.5 pg (ref 26.0–34.0)
MCHC: 32.5 g/dL (ref 30.0–36.0)
MCV: 93.9 fL (ref 78.0–100.0)
RBC: 4.46 MIL/uL (ref 4.22–5.81)

## 2011-01-03 LAB — SURGICAL PCR SCREEN: Staphylococcus aureus: NEGATIVE

## 2011-01-05 LAB — BASIC METABOLIC PANEL
CO2: 29 mEq/L (ref 19–32)
CO2: 29 mEq/L (ref 19–32)
Calcium: 9.5 mg/dL (ref 8.4–10.5)
Calcium: 9.9 mg/dL (ref 8.4–10.5)
Chloride: 101 mEq/L (ref 96–112)
Chloride: 105 mEq/L (ref 96–112)
Creatinine, Ser: 1.55 mg/dL — ABNORMAL HIGH (ref 0.4–1.5)
GFR calc Af Amer: 53 mL/min — ABNORMAL LOW (ref >60)
GFR calc Af Amer: 59 mL/min — ABNORMAL LOW (ref >60)
GFR calc Af Amer: >60 mL/min (ref >60)
GFR calc non Af Amer: 49 mL/min — ABNORMAL LOW (ref >60)
Potassium: 4.1 mEq/L (ref 3.5–5.1)
Sodium: 137 mEq/L (ref 135–145)
Sodium: 138 mEq/L (ref 135–145)

## 2011-01-05 LAB — COMPREHENSIVE METABOLIC PANEL
Alkaline Phosphatase: 77 U/L (ref 39–117)
BUN: 20 mg/dL (ref 6–23)
Calcium: 10 mg/dL (ref 8.4–10.5)
Creatinine, Ser: 1.55 mg/dL — ABNORMAL HIGH (ref 0.4–1.5)
Glucose, Bld: 86 mg/dL (ref 70–99)
Total Protein: 6.7 g/dL (ref 6.0–8.3)

## 2011-01-05 LAB — CBC
HCT: 38.3 % — ABNORMAL LOW (ref 39.0–52.0)
Hemoglobin: 11.4 g/dL — ABNORMAL LOW (ref 13.0–17.0)
Hemoglobin: 12.5 g/dL — ABNORMAL LOW (ref 13.0–17.0)
MCH: 32.9 pg (ref 26.0–34.0)
MCH: 33 pg (ref 26.0–34.0)
MCHC: 33.5 g/dL (ref 30.0–36.0)
MCV: 97.3 fL (ref 78.0–100.0)
MCV: 98 fL (ref 78.0–100.0)
RBC: 3.47 MIL/uL — ABNORMAL LOW (ref 4.22–5.81)
RBC: 3.78 MIL/uL — ABNORMAL LOW (ref 4.22–5.81)
RDW: 15.6 % — ABNORMAL HIGH (ref 11.5–15.5)

## 2011-01-05 LAB — DIFFERENTIAL
Basophils Relative: 1 % (ref 0–1)
Lymphocytes Relative: 11 % — ABNORMAL LOW (ref 12–46)
Monocytes Relative: 6 % (ref 3–12)
Neutro Abs: 6.2 10*3/uL (ref 1.7–7.7)
Neutrophils Relative %: 79 % — ABNORMAL HIGH (ref 43–77)

## 2011-01-05 LAB — URINALYSIS, ROUTINE W REFLEX MICROSCOPIC
Glucose, UA: NEGATIVE mg/dL
Hgb urine dipstick: NEGATIVE
Ketones, ur: NEGATIVE mg/dL
Protein, ur: NEGATIVE mg/dL
Urobilinogen, UA: 0.2 mg/dL (ref 0.0–1.0)

## 2011-01-05 LAB — SURGICAL PCR SCREEN: MRSA, PCR: POSITIVE — AB

## 2011-01-07 LAB — URINALYSIS, ROUTINE W REFLEX MICROSCOPIC
Bilirubin Urine: NEGATIVE
Glucose, UA: NEGATIVE mg/dL
Ketones, ur: NEGATIVE mg/dL
Protein, ur: NEGATIVE mg/dL
pH: 5 (ref 5.0–8.0)

## 2011-01-07 LAB — CBC
HCT: 36.7 % — ABNORMAL LOW (ref 39.0–52.0)
Hemoglobin: 12.4 g/dL — ABNORMAL LOW (ref 13.0–17.0)
MCH: 32.6 pg (ref 26.0–34.0)
MCHC: 33.7 g/dL (ref 30.0–36.0)
MCV: 96.7 fL (ref 78.0–100.0)
RDW: 15.2 % (ref 11.5–15.5)

## 2011-01-07 LAB — COMPREHENSIVE METABOLIC PANEL
ALT: 23 U/L (ref 0–53)
BUN: 37 mg/dL — ABNORMAL HIGH (ref 6–23)
CO2: 25 mEq/L (ref 19–32)
Calcium: 11.3 mg/dL — ABNORMAL HIGH (ref 8.4–10.5)
Creatinine, Ser: 2.07 mg/dL — ABNORMAL HIGH (ref 0.4–1.5)
GFR calc non Af Amer: 31 mL/min — ABNORMAL LOW (ref >60)
Glucose, Bld: 125 mg/dL — ABNORMAL HIGH (ref 70–99)
Sodium: 140 mEq/L (ref 135–145)
Total Protein: 6.8 g/dL (ref 6.0–8.3)

## 2011-01-07 LAB — DIFFERENTIAL
Eosinophils Absolute: 0.3 10*3/uL (ref 0.0–0.7)
Lymphs Abs: 1.1 10*3/uL (ref 0.7–4.0)
Monocytes Relative: 5 % (ref 3–12)
Neutro Abs: 5.8 10*3/uL (ref 1.7–7.7)
Neutrophils Relative %: 77 % (ref 43–77)

## 2011-01-07 LAB — SURGICAL PCR SCREEN
MRSA, PCR: NEGATIVE
Staphylococcus aureus: NEGATIVE

## 2011-01-08 LAB — BASIC METABOLIC PANEL
CO2: 27 mEq/L (ref 19–32)
CO2: 27 mEq/L (ref 19–32)
Calcium: 8.5 mg/dL (ref 8.4–10.5)
Chloride: 104 mEq/L (ref 96–112)
Creatinine, Ser: 1.29 mg/dL (ref 0.4–1.5)
Creatinine, Ser: 1.47 mg/dL (ref 0.4–1.5)
GFR calc Af Amer: 56 mL/min — ABNORMAL LOW (ref >60)
GFR calc Af Amer: >60 mL/min (ref >60)
Glucose, Bld: 115 mg/dL — ABNORMAL HIGH (ref 70–99)

## 2011-01-08 LAB — URINALYSIS, ROUTINE W REFLEX MICROSCOPIC
Glucose, UA: NEGATIVE mg/dL
Hgb urine dipstick: NEGATIVE
Specific Gravity, Urine: 1.021 (ref 1.005–1.030)
pH: 6 (ref 5.0–8.0)

## 2011-01-08 LAB — COMPREHENSIVE METABOLIC PANEL
AST: 26 U/L (ref 0–37)
Albumin: 4.1 g/dL (ref 3.5–5.2)
BUN: 22 mg/dL (ref 6–23)
Creatinine, Ser: 1.31 mg/dL (ref 0.4–1.5)
GFR calc Af Amer: >60 mL/min (ref >60)
Potassium: 4 mEq/L (ref 3.5–5.1)
Total Protein: 6.8 g/dL (ref 6.0–8.3)

## 2011-01-08 LAB — DIFFERENTIAL
Lymphocytes Relative: 14 % (ref 12–46)
Monocytes Absolute: 0.5 10*3/uL (ref 0.1–1.0)
Monocytes Relative: 7 % (ref 3–12)
Neutro Abs: 5.3 10*3/uL (ref 1.7–7.7)

## 2011-01-08 LAB — HEMOGLOBIN AND HEMATOCRIT, BLOOD
HCT: 37.2 % — ABNORMAL LOW (ref 39.0–52.0)
HCT: 37.3 % — ABNORMAL LOW (ref 39.0–52.0)
Hemoglobin: 12.4 g/dL — ABNORMAL LOW (ref 13.0–17.0)
Hemoglobin: 12.4 g/dL — ABNORMAL LOW (ref 13.0–17.0)

## 2011-01-08 LAB — CBC
HCT: 43.9 % (ref 39.0–52.0)
MCHC: 33 g/dL (ref 30.0–36.0)
MCHC: 33.2 g/dL (ref 30.0–36.0)
MCV: 97.5 fL (ref 78.0–100.0)
MCV: 98.1 fL (ref 78.0–100.0)
Platelets: 138 10*3/uL — ABNORMAL LOW (ref 150–400)
Platelets: 173 10*3/uL (ref 150–400)
RBC: 3.37 MIL/uL — ABNORMAL LOW (ref 4.22–5.81)
RBC: 3.63 MIL/uL — ABNORMAL LOW (ref 4.22–5.81)
RDW: 14.6 % (ref 11.5–15.5)
RDW: 14.8 % (ref 11.5–15.5)

## 2011-01-08 LAB — BUN: BUN: 28 mg/dL — ABNORMAL HIGH (ref 6–23)

## 2011-01-08 LAB — CREATININE, SERUM: GFR calc Af Amer: 46 mL/min — ABNORMAL LOW (ref >60)

## 2011-01-08 LAB — TYPE AND SCREEN: ABO/RH(D): O POS

## 2011-01-08 LAB — POTASSIUM: Potassium: 4 mEq/L (ref 3.5–5.1)

## 2011-01-13 ENCOUNTER — Ambulatory Visit (HOSPITAL_COMMUNITY)
Admission: RE | Admit: 2011-01-13 | Discharge: 2011-01-14 | Disposition: A | Discharge status: Home / Self Care | Payer: Medicare Other | Source: Ambulatory Visit | Attending: General Surgery | Admitting: General Surgery

## 2011-01-13 DIAGNOSIS — K432 Incisional hernia without obstruction or gangrene: Secondary | ICD-10-CM | POA: Insufficient documentation

## 2011-01-13 DIAGNOSIS — N189 Chronic kidney disease, unspecified: Secondary | ICD-10-CM | POA: Insufficient documentation

## 2011-01-13 DIAGNOSIS — I129 Hypertensive chronic kidney disease with stage 1 through stage 4 chronic kidney disease, or unspecified chronic kidney disease: Secondary | ICD-10-CM | POA: Insufficient documentation

## 2011-01-15 NOTE — Op Note (Signed)
NAMEJAYMIE, [name]              ACCOUNT NO.:  [id number]  MEDICAL RECORD NO.:  [id number]           PATIENT TYPE:  I  LOCATION:  1523                         FACILITY[facility]  [facility]  PHYSICIAN:  Angelia Mould. Derrell Lolling, M.D.DATE OF BIRTH:  12/08/1930  DATE OF PROCEDURE:  01/13/2011 DATE OF DISCHARGE:                              OPERATIVE REPORT   PREOPERATIVE DIAGNOSIS:  Ventral incisional hernia.  POSTOPERATIVE DIAGNOSIS:  Ventral incisional hernia.  OPERATION PERFORMED:  Laparoscopic lysis of adhesions, laparoscopic repair of ventral incisional hernia with Physiomesh (15 x 20 cm dimension).  SURGEON:  Angelia Mould. Derrell Lolling, M.D.  ASSISTANT:  Velora Heckler, MD  OPERATIVE INDICATIONS:  This is a 77 year old Caucasian man who has a history of an open prostatectomy in the past.  He also has a history of extended left colectomy with primary anastomosis and temporary diverting loop ileostomy for diverticulitis.  He subsequently had the loop ileostomy taken down.  He has developed a hernia in the right abdominal incision where his loop ileostomy was.  This is reducible, but it is causing some pain.  He has been evaluated and counseled as an outpatient.  He wants to have this repaired.  He is brought to the operating room electively.  OPERATIVE FINDINGS:  There was a defect in the right abdominal wall just to the right and slightly inferior to the umbilicus.  It was probably 7 cm transversely by about 4 or 5 cm vertically.  There were moderate chronic adhesions of omentum to the anterior abdominal wall and also a couple of loops of small bowel tethered to the rim of the hernia, but these adhesions were taken down under direct vision without any problem or injury to the bowel.  OPERATIVE TECHNIQUE:  Following induction of general endotracheal anesthesia, the patient's abdomen was prepped and draped in a sterile fashion.  Intravenous antibiotics were given.  Surgical time-out was held  identifying correct patient, correct procedure, and correct site. A 0.5% Marcaine with epinephrine was used as a local infiltration anesthetic.  A 5 mm optical port was placed in the right subcostal region.  Entry was under direct vision and it was uneventful.  Pneumoperitoneum was created.  Video camera was inserted with findings as described above.  A 5 mm trocar was placed in the left lower quadrant and an 11 mm trocar placed in the left upper quadrant.  We spent some time taking all of the adhesions down.  It probably took about 25-30 minutes to get all of the omental adhesions off the intra-abdominal wall.  We then very carefully took down the small-bowel adhesions from the rim of hernia, but I had a good dissection plane between the parietal peritoneum and the small bowel.  Inspection following taking down all the adhesions revealed the small bowel to be healthy without any sign of injury.  I mapped out the hernia with a spinal needle and then measured 4-5 cm rim all the way around this and if I used a 15 x 20 cm piece of mesh, I actually had about 5 cm overlap in all directions.  This mesh was brought to the operative  field and placed on the abdominal wall.  Using a marking pen, I marked template where I could place 8 equidistant fixation sutures.  I then took 0 Novafil and placed the 8 equidistant sutures around the rim of the mesh and tied them down with 3 or 4 knots. This was then inserted into the abdominal cavity in position.  At the 8 suture fixation sites, I made a puncture wound with the knife and using Endoclose suture passer, I drew the Novafil sutures up through the abdominal wall, being careful to take about a 1 cm bite of tissue for good fixation.  After I placed all 8 sutures, I lifted the fixation sutures up and the mesh deployed nicely without any redundancy or overlapping or deformity.  All these knots were tied.  I used the Covidien absorbable screw tacker and  placed about 50 of the screw tacks in the mesh in a double-crown technique.  I inspected both the inner and the outer ring on several occasions and there did not appear to be any gaps or defects.  There was no bleeding.  Everything looked good.  I checked the small bowel and the omentum and they looked fine.  The pneumoperitoneum was released.  The trocars were removed.  The skin incisions were closed with subcuticular sutures of 4-0 Monocryl and Dermabond.  Velcro binder was placed.  The patient tolerated the well and was taken to recovery room in stable condition.  Estimated blood loss was less than or equal to 30 cc.  Complications none.  Sponge, needle, instrument counts were correct.Angelia Mould. Derrell Lolling, M.D.     HMI/MEDQ  D:  01/13/2011  T:  01/13/2011  Job:  [id number]  cc:   [name] Ora, MD [phone] [name] [place], [place] [address]  [name], M.D. Fax: [phone]  Electronically Signed by [name] M.D. on 01/15/2011 05:00:02 PM

## 2011-01-29 ENCOUNTER — Inpatient Hospital Stay (HOSPITAL_COMMUNITY)
Admission: EM | Admit: 2011-01-29 | Discharge: 2011-02-02 | DRG: 176 | Disposition: A | Discharge status: Home / Self Care | Payer: Medicare Other | Attending: Internal Medicine | Admitting: Internal Medicine

## 2011-01-29 ENCOUNTER — Emergency Department (HOSPITAL_COMMUNITY): Payer: Medicare Other

## 2011-01-29 DIAGNOSIS — IMO0002 Reserved for concepts with insufficient information to code with codable children: Secondary | ICD-10-CM | POA: Diagnosis present

## 2011-01-29 DIAGNOSIS — Z8546 Personal history of malignant neoplasm of prostate: Secondary | ICD-10-CM

## 2011-01-29 DIAGNOSIS — I824Y9 Acute embolism and thrombosis of unspecified deep veins of unspecified proximal lower extremity: Secondary | ICD-10-CM | POA: Diagnosis not present

## 2011-01-29 DIAGNOSIS — Y838 Other surgical procedures as the cause of abnormal reaction of the patient, or of later complication, without mention of misadventure at the time of the procedure: Secondary | ICD-10-CM | POA: Diagnosis present

## 2011-01-29 DIAGNOSIS — I10 Essential (primary) hypertension: Secondary | ICD-10-CM | POA: Diagnosis present

## 2011-01-29 DIAGNOSIS — L408 Other psoriasis: Secondary | ICD-10-CM | POA: Diagnosis present

## 2011-01-29 DIAGNOSIS — I2699 Other pulmonary embolism without acute cor pulmonale: Principal | ICD-10-CM | POA: Diagnosis present

## 2011-01-29 LAB — COMPREHENSIVE METABOLIC PANEL
CO2: 28 mEq/L (ref 19–32)
Calcium: 9.7 mg/dL (ref 8.4–10.5)
Creatinine, Ser: 1.46 mg/dL (ref 0.4–1.5)
GFR calc Af Amer: 57 mL/min — ABNORMAL LOW (ref >60)
GFR calc non Af Amer: 47 mL/min — ABNORMAL LOW (ref >60)
Glucose, Bld: 104 mg/dL — ABNORMAL HIGH (ref 70–99)

## 2011-01-29 LAB — URINALYSIS, ROUTINE W REFLEX MICROSCOPIC
Bilirubin Urine: NEGATIVE
Hgb urine dipstick: NEGATIVE
Nitrite: NEGATIVE
Protein, ur: NEGATIVE mg/dL
Urobilinogen, UA: 0.2 mg/dL (ref 0.0–1.0)

## 2011-01-29 LAB — PROTIME-INR: INR: 1.02 (ref 0.00–1.49)

## 2011-01-29 LAB — DIFFERENTIAL
Basophils Relative: 1 % (ref 0–1)
Eosinophils Absolute: 0.9 10*3/uL — ABNORMAL HIGH (ref 0.0–0.7)
Eosinophils Relative: 10 % — ABNORMAL HIGH (ref 0–5)
Monocytes Absolute: 0.6 10*3/uL (ref 0.1–1.0)
Monocytes Relative: 6 % (ref 3–12)

## 2011-01-29 LAB — CBC
MCH: 31.4 pg (ref 26.0–34.0)
MCHC: 33.7 g/dL (ref 30.0–36.0)
Platelets: 266 10*3/uL (ref 150–400)

## 2011-01-29 LAB — LIPASE, BLOOD: Lipase: 43 U/L (ref 11–59)

## 2011-01-29 LAB — APTT: aPTT: 38 seconds — ABNORMAL HIGH (ref 24–37)

## 2011-01-29 MED ORDER — IOHEXOL 300 MG/ML  SOLN
125.0000 mL | Freq: Once | INTRAMUSCULAR | Status: AC | PRN
  Administered 2011-01-29: 125 mL via INTRAVENOUS

## 2011-01-30 DIAGNOSIS — I2699 Other pulmonary embolism without acute cor pulmonale: Secondary | ICD-10-CM

## 2011-01-30 LAB — COMPREHENSIVE METABOLIC PANEL
Alkaline Phosphatase: 73 U/L (ref 39–117)
BUN: 15 mg/dL (ref 6–23)
CO2: 26 mEq/L (ref 19–32)
Chloride: 108 mEq/L (ref 96–112)
GFR calc non Af Amer: 58 mL/min — ABNORMAL LOW (ref >60)
Glucose, Bld: 109 mg/dL — ABNORMAL HIGH (ref 70–99)
Potassium: 4.5 mEq/L (ref 3.5–5.1)
Total Bilirubin: 1 mg/dL (ref 0.3–1.2)

## 2011-01-30 LAB — DIFFERENTIAL
Basophils Absolute: 0 10*3/uL (ref 0.0–0.1)
Basophils Relative: 1 % (ref 0–1)
Eosinophils Absolute: 0.2 10*3/uL (ref 0.0–0.7)
Neutro Abs: 4.6 10*3/uL (ref 1.7–7.7)
Neutrophils Relative %: 73 % (ref 43–77)

## 2011-01-30 LAB — URINE CULTURE: Culture: NO GROWTH

## 2011-01-30 LAB — CBC
HCT: 37.6 % — ABNORMAL LOW (ref 39.0–52.0)
HCT: 42.2 % (ref 39.0–52.0)
Hemoglobin: 14.4 g/dL (ref 13.0–17.0)
MCH: 30.2 pg (ref 26.0–34.0)
MCHC: 33 g/dL (ref 30.0–36.0)
MCV: 91.5 fL (ref 78.0–100.0)
RBC: 4.41 MIL/uL (ref 4.22–5.81)
RDW: 14.1 % (ref 11.5–15.5)
RDW: 14.2 % (ref 11.5–15.5)

## 2011-01-30 LAB — BASIC METABOLIC PANEL
BUN: 20 mg/dL (ref 6–23)
CO2: 27 mEq/L (ref 19–32)
Chloride: 104 mEq/L (ref 96–112)
Creatinine, Ser: 1.32 mg/dL (ref 0.4–1.5)

## 2011-01-30 LAB — URINALYSIS, ROUTINE W REFLEX MICROSCOPIC
Hgb urine dipstick: NEGATIVE
Protein, ur: NEGATIVE mg/dL
Urobilinogen, UA: 0.2 mg/dL (ref 0.0–1.0)

## 2011-01-31 DIAGNOSIS — I2609 Other pulmonary embolism with acute cor pulmonale: Secondary | ICD-10-CM

## 2011-01-31 LAB — COMPREHENSIVE METABOLIC PANEL
Albumin: 3.2 g/dL — ABNORMAL LOW (ref 3.5–5.2)
Alkaline Phosphatase: 79 U/L (ref 39–117)
BUN: 20 mg/dL (ref 6–23)
Chloride: 107 mEq/L (ref 96–112)
Creatinine, Ser: 1.47 mg/dL (ref 0.4–1.5)
Glucose, Bld: 102 mg/dL — ABNORMAL HIGH (ref 70–99)
Total Bilirubin: 0.7 mg/dL (ref 0.3–1.2)
Total Protein: 6 g/dL (ref 6.0–8.3)

## 2011-01-31 LAB — CBC
Platelets: 231 10*3/uL (ref 150–400)
RBC: 4.06 MIL/uL — ABNORMAL LOW (ref 4.22–5.81)
RDW: 14.1 % (ref 11.5–15.5)
WBC: 8 10*3/uL (ref 4.0–10.5)

## 2011-01-31 LAB — APTT: aPTT: 77 seconds — ABNORMAL HIGH (ref 24–37)

## 2011-01-31 LAB — HEPARIN LEVEL (UNFRACTIONATED): Heparin Unfractionated: 0.32 IU/mL (ref 0.30–0.70)

## 2011-01-31 LAB — PROTIME-INR: INR: 1.11 (ref 0.00–1.49)

## 2011-02-01 LAB — CBC
Hemoglobin: 11.8 g/dL — ABNORMAL LOW (ref 13.0–17.0)
MCH: 29.2 pg (ref 26.0–34.0)
Platelets: 237 10*3/uL (ref 150–400)
RBC: 4.04 MIL/uL — ABNORMAL LOW (ref 4.22–5.81)
WBC: 7.6 10*3/uL (ref 4.0–10.5)

## 2011-02-01 LAB — HEPARIN LEVEL (UNFRACTIONATED): Heparin Unfractionated: 0.4 IU/mL (ref 0.30–0.70)

## 2011-02-01 LAB — PROTIME-INR: Prothrombin Time: 18.2 seconds — ABNORMAL HIGH (ref 11.6–15.2)

## 2011-02-02 LAB — BASIC METABOLIC PANEL
BUN: 18 mg/dL (ref 6–23)
CO2: 26 mEq/L (ref 19–32)
Calcium: 9.6 mg/dL (ref 8.4–10.5)
Creatinine, Ser: 1.38 mg/dL (ref 0.4–1.5)
GFR calc Af Amer: >60 mL/min (ref >60)
Glucose, Bld: 104 mg/dL — ABNORMAL HIGH (ref 70–99)

## 2011-02-02 LAB — CBC
MCH: 30.1 pg (ref 26.0–34.0)
Platelets: 246 10*3/uL (ref 150–400)
RBC: 4.25 MIL/uL (ref 4.22–5.81)

## 2011-02-02 LAB — HEPARIN LEVEL (UNFRACTIONATED): Heparin Unfractionated: 0.35 IU/mL (ref 0.30–0.70)

## 2011-02-02 NOTE — H&P (Signed)
[name], [name] NO.:  [id number]  MEDICAL RECORD NO.:  [id number]           PATIENT TYPE:  I  LOCATION:  1445                         FACILITY:  [facility]  PHYSICIAN:  Gwen Pounds, MD       DATE OF BIRTH:  06-10-1931  DATE OF ADMISSION:  01/29/2011 DATE OF DISCHARGE:                             HISTORY & PHYSICAL   PRIMARY CARE PROVIDER:  Gaspar Garbe, MD  SURGEON:  Angelia Mould. Derrell Lolling, MD  CHIEF COMPLAINT:  Bilateral PE incidentally found on CT scan without any hemodynamic compromise.  HISTORY OF PRESENT ILLNESS:  This is a 77 year old male status post recent extensive surgery on January 13, 2011, for hernia repair, recovered nicely.  He had increasing pain yesterday, took an ice pack, took pain medications including Vicodin and the pain continued to increase.  He says that he felt the swelling along his abdominal wall.  He came to the ED for eval and treatment.  CT scan was done and showed a fluid collection with questionable abscess versus seroma and inadvertently found bilateral lower lobe PEs.  He reports being woken up about 2 or 3 times since the surgery with cramps in his toes and his feet, but never had any lower extremity edema and never had any calf pain.  No recent chest pain or shortness of breath.  In the emergency room, he was given heparin drip, IV fluids, Zofran, and morphine and I was called for inpatient admission.  My question when I got call for inpatient admission was whether he truly needs to be in the hospital because if it was just the bilateral PEs, we would put him on Lovenox in this situation and work this up as an outpatient, and get him on Coumadin. With the seroma and the recent surgeries, it seems reasonable to put him on heparin, put him in the hospital, and find out what Surgery wants to do and if we are given permission to do Lovenox, we will be more than happy to send him home.  PAST MEDICAL HISTORY: 1. Ileostomy  in January 2011 secondary to diverticulitis. 2. Reversal of ostomy in September 2011. 3. Hernia repair in March 2012. 4. History of gout. 5. History of hypertension. 6. History of prostate cancer, status post resection. 7. History of hernia surgery x3. 8. History of fissure surgery x1. 9. Inguinal hernia repair. 10.Psoriasis.  ALLERGIES:  SULFA.  MEDICATIONS: 1. Vicodin as directed. 2. Multivitamin daily. 3. Cream for rash and psoriasis. 4. Hydrochlorothiazide 50 mg daily. 5. Klor-Con as directed. 6. Allopurinol 300 daily. 7. Sudafed p.r.n. 8. Carvedilol 6.25 mg p.o. b.i.d. 9. Probiotic as directed.  SOCIAL HISTORY:  He is married, 2 stepchildren, retired.  No tobacco. No alcohol.  FAMILY HISTORY:  No DVT or pulmonary emboli.  Father died of old age at 31 years old with history of stroke and prostate cancer.  Mother died at the age of 6.  REVIEW OF SYSTEMS:  Please see HPI.  No melena.  No bleeding anywhere. Biggest issues lately have been the abdominal pains status post surgery, and he reports no other positive review  of system.  All other organ systems reviewed.  PHYSICAL EXAMINATION:  VITAL SIGNS:  Temperature 97.3, blood pressure 130 to 160 over 92 to 110, heart rate is 78 to 94, respiratory rate is 16 to 22, saturating 99% on room air. GENERAL:  Alert and oriented. HEENT:  Oropharynx is moist. PULMONARY:  Clear to auscultation bilaterally. CARDIAC:  Regular. ABDOMEN:  Soft, distended, obese.  Surgical scar is noted, it is clean, dry, and intact. EXTREMITIES:  No clubbing.  No cyanosis.  No edema.  ANCILLARY:  White count 9, hemoglobin 13.5, and platelet count 266. Sodium 136, potassium 3.7, chloride 103, bicarbonate 28, BUN 22, creatinine 1.46, glucose 104.  Urinalysis negative.  Lipase 43.  CT shows bilateral PE, fluid collection along abdominal wall with a question seroma versus small abscess, bilateral renal cyst, no bowel obstruction, status post  anastomosis, which was unremarkable.  EKG showed normal sinus rhythm, left anterior fascicular block, LVH.  ASSESSMENT:  This is a man being admitted with bilateral PE, status post surgery with a seroma versus abscess.  PLAN: 1. He has no hemodynamic issues and is not hypoxic, he looks great.     It will be very reasonable in any other circumstance to discharge     him home on Lovenox and Coumadin for up to 3-6 months and titrate his     Coumadin as an outpatient.  For now, we will admit on heparin drip     until Surgery decides what to do with the fluid collection and     whether we can go ahead and give him Lovenox injections into the     belly after the recent surgeries.  We may be stuck with the heparin     drip for now. 2. Inpatient bed telemetry. 3. 2D echocardiogram, lower extremity Dopplers, and D-dimer have been     ordered.  Labs have been ordered.  Home medications have been     ordered and pain control has been ordered. 4. I doubt whether a V/Q scan will add anything at this current time.     Gwen Pounds, MD     JMR/MEDQ  D:  01/29/2011  T:  01/30/2011  Job:  [id number]  cc:   Gaspar Garbe, M.D. Fax: [phone]  Angelia Mould. Derrell Lolling, M.D. [address], [address] [place] [address]  Electronically Signed by Creola Corn MD on 02/02/2011 09:33:27 PM

## 2011-02-03 NOTE — Discharge Summary (Signed)
NAMELOUIE, [name]              ACCOUNT NO.:  [id number]  MEDICAL RECORD NO.:  [id number]           PATIENT TYPE:  I  LOCATION:  1445                         FACILITY:  [facility]  PHYSICIAN:  Gaspar Garbe, M.D.DATE OF BIRTH:  Aug 26, 1931  DATE OF ADMISSION:  01/29/2011 DATE OF DISCHARGE:  02/02/2011                              DISCHARGE SUMMARY   DISCHARGE MEDICATIONS: 1. Coumadin 5 mg p.o. daily in the evening. 2. New medication allopurinol 300 mg p.o. q.h.s. 3. Carvedilol 6.25 mg 1 tablet p.o. b.i.d. 4. Clobetasol cream for rash behind the ears twice daily. 5. Hydrochlorothiazide 50 mg p.o. q.p.m. 6. Vicodin 1 to 2 tablets p.o. q.4 h and as needed for pain. 7. Klor-Con 20 mEq 1 tablet by mouth every evening. 8. Multivitamin 1 tablet by mouth every morning, not to change     formulation or dose at home. 9. Sulfacetamide 10% cream for psoriasis on scalp b.i.d. 10.Probiotic 1 capsule every morning. 11.Sudafed as needed for sinus congestion.  FINAL DIAGNOSES: 1. Bilateral pulmonary embolus, seen on abdominal CT. 2. Abdominal seroma status post ventral hernia repair, benign. 3. Hypertension. 4. Psoriasis. 5. Gout, controlled. 6. History of prostate cancer status post resection. 7. History of ileostomy, January 2011, with reversal in September     2011.  ALLERGIES:  SULFA.  DISCHARGE PHYSICAL EXAMINATION:  VITAL SIGNS:  Temperature 97.7, pulse 77, respiratory rate 20, blood pressure 146/80, satting 99% on room air. GENERAL:  No acute distress. HEENT:  Normocephalic, atraumatic.  PERRLA.  EOMI.  ENT is within normal limits. NECK:  Supple.  No lymphadenopathy, JVD or bruit. HEART:  Regular rate and rhythm. LUNGS:  Clear to auscultation bilaterally. ABDOMEN:  Mild tenderness near area of ventral hernia repair with no erythema or exudate noted. EXTREMITIES:  No clubbing, cyanosis or edema.  RESULTS:  The patient underwent CAT scan of his abdomen on arrival  which showed a seroma of 5 x 4.1 x 0.8 cm.  The patient was seen by Surgery and this was judged as being benign and not amenable to intervention.  Bilateral renal cysts, pulmonary embolus seen within the segmental branch of the right lower lobe and subsegmental branch of the left lower lobe.  The patient had lower extremity Doppler showing an area of acute DVT involving the right lower extremity in the right popliteal area and acute thrombosis.  LABORATORY TESTING:  On date of discharge, his INR was 1.91, this was drawn at 5:11 a.m. and the patient will be held until late morning for his INR affectively rate of rise to be 2.0.  He remained on heparin during the time of his hospitalization.  His heparin anti-Xa level was 0.35 on date of discharge.  BMET shows sodium 141, potassium 3.5, BUN and creatinine 18 and 1.3, glucose normal at 1.4.  Urine culture done on admission shows no growth.  Initial D-dimer was elevated at 3.56. Lipase done in the emergency room was normal at 43.  SUMMARY OF HOSPITAL COURSE:  Mr. Roop came to the emergency room because of abdominal pain in his upper abdomen subsequently underwent a CAT scan through the emergency room  which showed evidence of bilateral pulmonary embolus.  Medicine was asked to admit the patient and my partner admitted him over the weekend.  The patient was put on IV heparin and initiated on Coumadin.  The next day, a right lower extremity Doppler was positive for evidence of DVT.  Note, the patient had had hernia repair within the past several weeks.  Surgical consultation was undertaken by Dr. Derrell Lolling regarding the seroma and it was thought to be benign and not amenable to incision and drainage with any sort of incision or drainage opening him up to infection and would most likely be self-limited.  The patient was continued to be watched on heparin and had no difficulties with breathing.  His Coumadin dose gradually rose and he was  given initially 2 doses of 10 mg before a significant rise was seen and continued on 5 mg daily.  His current protime done early this morning was 1.91 giving him effective INR of 2.0 by late morning which will be the time of his discharge.  FOLLOWUP:  The patient is to follow up at Dr. Deneen Harts office at 9:10 a.m. to see Su, our nurse practitioner, for a protime check on Monday the 16th.  He was given educational information about Coumadin including don'ts and was asked to call the office if he has any spontaneous bleeding for considerable bruising.  The patient was well aware of this. I also has spoken to the patient's wife several days before discharge indicating what our plan of action was and she was behind this as well. Of note, the patient's wife, [name], has been my patient for years now.  CONDITION ON DISCHARGE:  Stable.     Gaspar Garbe, M.D.     RWT/MEDQ  D:  02/02/2011  T:  02/02/2011  Job:  [id number]  Electronically Signed by Guerry Bruin M.D. on 02/03/2011 08:39:52 AM

## 2011-02-12 ENCOUNTER — Emergency Department (HOSPITAL_COMMUNITY)
Admission: EM | Admit: 2011-02-12 | Discharge: 2011-02-13 | Disposition: A | Discharge status: Home / Self Care | Payer: Medicare Other | Attending: Emergency Medicine | Admitting: Emergency Medicine

## 2011-02-12 DIAGNOSIS — K625 Hemorrhage of anus and rectum: Secondary | ICD-10-CM | POA: Insufficient documentation

## 2011-02-12 DIAGNOSIS — I10 Essential (primary) hypertension: Secondary | ICD-10-CM | POA: Insufficient documentation

## 2011-02-12 DIAGNOSIS — Z7901 Long term (current) use of anticoagulants: Secondary | ICD-10-CM | POA: Insufficient documentation

## 2011-02-12 LAB — DIFFERENTIAL
Basophils Absolute: 0 10*3/uL (ref 0.0–0.1)
Basophils Relative: 1 % (ref 0–1)
Eosinophils Absolute: 0.9 10*3/uL — ABNORMAL HIGH (ref 0.0–0.7)
Eosinophils Relative: 11 % — ABNORMAL HIGH (ref 0–5)
Monocytes Absolute: 0.5 10*3/uL (ref 0.1–1.0)
Neutro Abs: 5.4 10*3/uL (ref 1.7–7.7)

## 2011-02-12 LAB — COMPREHENSIVE METABOLIC PANEL
Alkaline Phosphatase: 91 U/L (ref 39–117)
BUN: 23 mg/dL (ref 6–23)
CO2: 28 mEq/L (ref 19–32)
Calcium: 10.1 mg/dL (ref 8.4–10.5)
GFR calc non Af Amer: 41 mL/min — ABNORMAL LOW (ref >60)
Glucose, Bld: 120 mg/dL — ABNORMAL HIGH (ref 70–99)
Total Protein: 6.8 g/dL (ref 6.0–8.3)

## 2011-02-12 LAB — PROTIME-INR
INR: 2.9 — ABNORMAL HIGH (ref 0.00–1.49)
Prothrombin Time: 30.4 seconds — ABNORMAL HIGH (ref 11.6–15.2)

## 2011-02-12 LAB — CBC
MCHC: 33.3 g/dL (ref 30.0–36.0)
RDW: 14.4 % (ref 11.5–15.5)

## 2011-02-12 LAB — TYPE AND SCREEN
ABO/RH(D): O POS
Antibody Screen: NEGATIVE

## 2011-03-03 ENCOUNTER — Encounter (INDEPENDENT_AMBULATORY_CARE_PROVIDER_SITE_OTHER): Payer: Self-pay | Admitting: General Surgery

## 2011-03-07 NOTE — Op Note (Signed)
[name], [name]              ACCOUNT NO.:  [id number]   MEDICAL RECORD NO.:  [id number]          PATIENT TYPE:  AMB   LOCATION:  ENDO                         FACILITY:  [facility]   PHYSICIAN:  Petra Kuba, M.D.    DATE OF BIRTH:  1931-03-01   DATE OF PROCEDURE:  12/21/2008  DATE OF DISCHARGE:                               OPERATIVE REPORT   PROCEDURE:  Colonoscopy with biopsy and ERBE argon tissue plasma  coagulator.   INDICATIONS:  The patient with radiation proctitis as well as history of  colon polyps due for repeat screening and some bright red blood per  rectum.  Consent was signed after risks, benefits, methods, and options  were thoroughly discussed multiple times in the past.   MEDICINES USED:  Fentanyl 75 mcg and Versed 7.5 mg.   PROCEDURE IN DETAIL:  Rectal inspection was pertinent for external  hemorrhoids small.  Digital exam was negative.  The video colonoscope  was inserted and with some difficulty due to a sigmoid filled with  diverticula.  Once through this area, I was easily able to advance to  the cecum.  This did not require any abdominal pressure or any position  changes.  No signs of bleeding was seen on insertion.  The cecum was  identified by the appendiceal orifice and the ileocecal valve.  The  proctoscope was inserted short ways in the terminal ileum which was  normal.  No GI AVMs were seen.  However, in the cecum and the proximal  ascending colon about 4-5 small AVMs were seen.  Photo documentation was  obtained.  The scope was slowly withdrawn.  The prep was fairly  adequate.  There was some stool balls which could be washed into  different areas.  The majority of this stool was easily washed and  suctioned.  There was a rare mid transverse and descending diverticula,  primarily the diverticula were all on the left side and in the sigmoid.  There was a tiny distal sigmoid polyp which was cold biopsied x2 but no  other polypoid lesions were seen.   Once back in the rectum, anorectal  pull-through and retroflexion confirmed some small hemorrhoids and  confirmed a very mild radiation proctitis.  Based on his bright red  blood and requesting the trial of the ERBE tissue plasma coagulator, it  was done in the customary fashion under straight and retroflex  visualization with good cautery of the telangiectasias.  The scope was  straightened, air was suctioned, and scope removed.  The patient  tolerated the procedure well.  There was no obvious immediate  complication.   ENDOSCOPIC DIAGNOSES:  1. Internal and external hemorrhoids.  2. Minimal distal radiation proctitis status post ERBE.  3. Primarily sigmoid diverticula with a rare descending and transverse      diverticula.  4. 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  5. Tiny distal sigmoid polyp, biopsied.  6. Otherwise within normal limits to the terminal ileum.   PLAN:  Await pathology but probably recheck colon screening in 5 years.  He is doing well mentally.  Happy  to see medically.  Happy to see back  p.r.n. and if he has multiple more bouts of diverticulitis, might  consider surgical options and see how the ERBE works and call me p.r.n.           ______________________________  Petra Kuba, M.D.     MEM/MEDQ  D:  12/21/2008  T:  12/21/2008  Job:  [id number]   cc:   [name], MD

## 2011-03-10 NOTE — Procedures (Signed)
[place]  Patient:    [name], [name]                     MRN: [id number] Proc. Date: 12/31/00 Adm. Date:  [id number] Attending:  Nelda Marseille CC:         Redmond Baseman, M.D.  Sigmund I. Patsi Sears, M.D.   Procedure Report  PROCEDURE PERFORMED:  Colonoscopy with polypectomy.  ENDOSCOPIST:  Petra Kuba, M.D.  INDICATIONS FOR PROCEDURE:  Patient due for colonic screening.  Consent was signed after risks, benefits, methods, and options were thoroughly discussed many times in the past.  MEDICATIONS USED:  Demerol 40 mg, Versed 2.5 mg.  DESCRIPTION OF PROCEDURE:  Rectal inspection was pertinent for external hemorrhoids.  Digital exam was negative.  The video pediatric colonoscope was inserted and easily advanced around the colon to the cecum.  This did require some minimal left lower quadrant pressure but no position changes.  The cecum was identified by the appendiceal orifice and the ileocecal valve.  On insertion in the approximate level of the splenic flexure, a small 2 mm polyp was seen and was hot biopsied x 1.  Also at the approximate level of the hepatic flexure another 1 to 2 mm polyp was seen and was hot biopsied x 1 as well.  The scope was slowly withdrawn.  The prep was adequate.  There was some liquid stool that required washing and suctioning.  The cecum and ascending were normal.  We saw both polypectomy sites which looked good without residual polyp seen on withdrawal.  In the midtransverse, an additional small polyp was seen and was hot biopsied x 1 as well.  Scope was further withdrawn.  Some occasional left-sided diverticula were seen but no other abnormalities. Once back in the rectum, the scope was retroflexed pertinent for some internal hemorrhoids.  Scope was straightened and air was withdrawn.  The scope removed.  The patient tolerated this part of the procedure well.  There was no obvious immediate  complication.  However, about two minutes after the procedure was over, he had a bout of hypotension and bradycardia, probably due to retained air.  He was asymptomatic and responded to IV fluids only.  This was short lived and no obvious residual sequelae.  ENDOSCOPIC DIAGNOSIS: 1. Internal and external hemorrhoids and scarring from previous surgery. 2. Occasion left-sided diverticula. 3. Three tiny to small transverse polyps all hot biopsied. 4. Otherwise within normal limits to the cecum.  PLAN:  Await pathology to determine future colonic screening.  GI follow-up p.r.n.  Otherwise yearly rectals and guaiacs per Dr. Modesto Charon.DD: 12/31/00 TD:  12/31/00 Job: [id number]

## 2011-03-10 NOTE — Op Note (Signed)
NAME:  [name], [name]                        ACCOUNT NO.:  [id number]   MEDICAL RECORD NO.:  [id number]                   PATIENT TYPE:  AMB   LOCATION:  ENDO                                 [facility]ACILITY[facility]  [facility]   PHYSICIAN:  Petra Kuba, M.D.                 DATE OF BIRTH:  1930-12-30   DATE OF PROCEDURE:  DATE OF DISCHARGE:                                 OPERATIVE REPORT   PROCEDURE:  Iron deficiency nondiagnostic colonoscopy, history of esophageal  strictures.   Consent was signed after risks, benefits, methods, and options were  thoroughly discussed in the office.   ADDITIONAL MEDICINES FOR THIS PROCEDURE:  2 of Versed only.   DESCRIPTION OF PROCEDURE:  The video endoscope was inserted by direct  vision. The procedure esophagus was normal.  In the mid esophagus was a  benign appearing moderately patent ring just above a moderate sized hiatal  hernia. There was some inflammation around it possibly a shallow ulceration.  This area was biopsied at the end of the procedure.  The hiatal hernia pouch  was normal, scope passed in the stomach, advanced through a normal antrum,  normal pylorus into a normal duodenal bulb. The C loop had some minimal  amount of inflammation and a little bit of scope trauma with passing around  it to a normal second and probably third part of the duodenum. One biopsy of  the duodenum was obtained to rule out any malabsorption, the scope was  slowly withdrawn.  Again a good look at the bulb and C loop ruled out any  obvious ulcerations, confirmed the above mentioned inflammation. The scope  was withdrawn back to the stomach and retroflexed. The angularis and fundus  were normal.  High in the cardia, the hiatal hernia was confirmed. The  lesser and greater curve were normal.  Straight visualization of the stomach  was normal. The scope was then slowly withdrawn. The esophageal biopsies of  the ring and the early ulcerated area were obtained, no other  abnormalities  were seen. The scope was removed.  The patient tolerated the procedure well.  There was no obvious or immediate complication.   ENDOSCOPIC DIAGNOSIS:  1. Moderate hiatal hernia.  2. Proximal ring with inflammation shallow ulcer status post biopsy.  3. C loop with minimal inflammation.  4. Otherwise normal to the second and third part of the duodenum status post     duodenal biopsy.   PLAN:  Await pathology, long-term pump inhibitors particularly as long as on  aspirin and nonsteroidals.  Add iron and see back in six weeks to recheck  guaiacs and CBC and leave to [employer]. Modesto Charon, M.D. consideration and Areatha Keas, M.D. consideration of changing aspirin to Plavix and minimizing  nonsteroidals particularly if iron deficiency or guaiac positivity  continues.  Petra Kuba, M.D.    MEM/MEDQ  D:  11/17/2003  T:  11/17/2003  Job:  [id number]   cc:   Thelma Barge P. Modesto Charon, M.D.  [address]  [place]  [place] [address]  Fax: [phone]   Areatha Keas, M.D.  [address]  [place] [address]  [place]  [place] [address]  Fax: [phone]

## 2011-03-10 NOTE — Op Note (Signed)
NAME:  [name], [name]                        ACCOUNT NO.:  [id number]   MEDICAL RECORD NO.:  [id number]                   PATIENT TYPE:  AMB   LOCATION:  ENDO                                 FACILITY[facility]   PHYSICIAN:  Petra Kuba, M.D.                 DATE OF BIRTH:  09/13/1931   DATE OF PROCEDURE:  11/17/2003  DATE OF DISCHARGE:                                 OPERATIVE REPORT   PROCEDURE:  Colonoscopy.   INDICATION:  Iron deficiency, bright red blood per rectum one time, history  of colon polyps.  Consent was signed after risks, benefits, methods, options  thoroughly discussed in the office on multiple occasions.   MEDICINES USED:  1. Demerol 60.  2. Versed 6.   DESCRIPTION OF PROCEDURE:  Rectal inspection was pertinent for external  hemorrhoids, small.  Digital exam was negative.  The video pediatric  colonoscope was inserted, fairly easily advanced around the colon to the  cecum.  This did require rolling him on his back and some abdominal  pressure.  On insertion, some left-sided diverticula were seen but no other  abnormalities.  Cecum was identified by the appendiceal orifice and the  ileocecal valve.  To advance to the cecum required rolling him on his back  and some abdominal pressure.  No blood seen on insertion.  In the cecum, a  small AVM was seen, and a much larger AVM was seen as well.  Both were  washed and watched and could not be made to bleed.  The scope was inserted a  short ways in the terminal ileum which was normal.  No blood was seen coming  from above.  The scope was slowly withdrawn.  The prep was fairly adequate,  did require lots of washing and suctioning for adequate visualization.  There were 2 tiny-to-small AVMs in the ascending colon.  Scope was slowly  withdrawn.  No polyps or other signs of abnormalities were seen other than  some left-sided diverticula which were moderate.  Anorectal pull-through and  retroflexion confirmed some small  hemorrhoids, confirmed the stricturing  from previous hemorrhoid operations and did reveal very minimal radiation  proctitis.  No other abnormalities were seen.  The scope was reinserted a  short ways up the left side of the colon; air was suctioned, scope removed.  The patient tolerated the procedure well.  There was no obvious immediate  complication.   ENDOSCOPIC DIAGNOSES:  1. Internal-external hemorrhoids with scarring.  2. Minimal radiation proctitis.  3. Significant left-sided diverticula.  4. Two ascending and 1 cecal small arterial venous malformations.  5. One large cecal arterial venous malformation.  6. Otherwise within normal limits to the terminal ileum without any blood     being seen.   PLAN:  1. Rescreen in 5 years.  2. Consider argon plasma coagulator of the arterial venous malformations     p.r.n.  3. Consideration  of stopping his as well as or possibly changing him to     Plavix or minimizing Naprosyn in conjunction with the aspirin and     continue work-up with an EGD just to be sure.                                               Petra Kuba, M.D.    MEM/MEDQ  D:  11/17/2003  T:  11/17/2003  Job:  [id number]

## 2011-03-10 NOTE — Assessment & Plan Note (Signed)
[facility]                          [place] OFFICE NOTE   NAME:[name] [name] RACKLEY                     MRN:          [id number]  DATE:08/13/2006                            DOB:          1930/11/02    CHIEF COMPLAINT:  Here to establish history of the present illness.   [name] [name] is 77 year old white male who came to the office for the first  time to get established. In general, he is doing well. He has no concerns,  except that he would like to check his cholesterol and his blood.   PAST MEDICAL HISTORY:  1. Diagnosed with prostate cancer in 1992. He elected to have surgery. His      PSA increased in 2002. He got x-ray therapy.  2. Gout.  3. Osteoarthritis, mostly of his knees.  4. Hypertension.  5. History of anemia, believed to be due to occasional hemorrhoidal bleed.  6. Rosacea.  7. Esophageal stricture.  8. The doctor's involved in his care include:      a.     Dr. Patsi Sears, his urologist.      b.     Dr. Phylliss Bob, his arthritis doctor.      c.     Dr. Ewing Schlein, his gastroenterologist.      d.     Dr. Yetta Barre, his dermatologist.  9. Hemorrhoidal surgery three times in the past.  10.Hernia repair.  11.He reports several EGDs and colonoscopies.   FAMILY HISTORY:  His old chart is pending and I will review his family  history then.   SOCIAL HISTORY:  He quit tobacco in 1981. He used to smoke cigars. He  occasionally drinks wine. He is married and has two stepkids.   REVIEW OF SYSTEMS:  He does watch his diet and is very active playing golf.  Denies any chest pain, shortness of breath, nausea.  He does have occasional  diarrhea and also occasional hemorrhoidal bleed.   MEDICATIONS:  1. Allopurinol 300 one p.o. q day.  2. Hydrochlorothiazide 50 one p.o. q day.  3. Potassium 20 mEq one p.o. q day.  4. Vasotec 50 one p.o. q day.  5. Sudafed p.r.n. for bladder incontinence.  6. Iron supplements.   ALLERGIES:  SULFA.   PHYSICAL  EXAMINATION:  The patient is alert, oriented and in no apparent  distress. He is 5 feet, 10-3/4 inches tall. He weighs 204 pounds. Blood  pressure: 130/88. Pulse: 74. Respiration: 14.  NECK: No thyromegaly.  EARS: Clear.  NOSE: Clear.  LUNGS:  Slightly decreased breath sounds, but clear.  CARDIOVASCULAR: Regular rate and rhythm without murmur.  ABDOMEN: Is not distended, soft, good bowel sounds. No organomegaly.  EXTREMITIES: No pretibial edema.   ASSESSMENT/PLAN:  1. Hypertension: This seems to be well controlled at present. Will check a      basic metabolic panel.  2. Will check a cholesterol level today.  3. History of gout: On allopurinol. Will check LFTs.  4. Anemia: Will get a hemoglobin level.  5. Got a flu shot today.  6. His records from his previous PCP are pending and will  review when they      come. Otherwise, he is to follow up here in three months.       [name] Ora, MD      JP/MedQ  DD:  08/13/2006  DT:  08/13/2006  Job #:  [id number]

## 2011-08-09 ENCOUNTER — Encounter: Payer: Medicare Other | Admitting: Vascular Surgery

## 2011-11-01 DIAGNOSIS — C61 Malignant neoplasm of prostate: Secondary | ICD-10-CM | POA: Diagnosis not present

## 2011-11-03 DIAGNOSIS — C61 Malignant neoplasm of prostate: Secondary | ICD-10-CM | POA: Diagnosis not present

## 2011-11-03 DIAGNOSIS — R32 Unspecified urinary incontinence: Secondary | ICD-10-CM | POA: Diagnosis not present

## 2011-11-08 DIAGNOSIS — I2699 Other pulmonary embolism without acute cor pulmonale: Secondary | ICD-10-CM | POA: Diagnosis not present

## 2011-11-08 DIAGNOSIS — Z7901 Long term (current) use of anticoagulants: Secondary | ICD-10-CM | POA: Diagnosis not present

## 2011-11-14 DIAGNOSIS — L570 Actinic keratosis: Secondary | ICD-10-CM | POA: Diagnosis not present

## 2011-11-14 DIAGNOSIS — L719 Rosacea, unspecified: Secondary | ICD-10-CM | POA: Diagnosis not present

## 2011-11-14 DIAGNOSIS — L219 Seborrheic dermatitis, unspecified: Secondary | ICD-10-CM | POA: Diagnosis not present

## 2011-11-17 DIAGNOSIS — H40019 Open angle with borderline findings, low risk, unspecified eye: Secondary | ICD-10-CM | POA: Diagnosis not present

## 2011-11-17 DIAGNOSIS — H259 Unspecified age-related cataract: Secondary | ICD-10-CM | POA: Diagnosis not present

## 2011-11-22 DIAGNOSIS — Z125 Encounter for screening for malignant neoplasm of prostate: Secondary | ICD-10-CM | POA: Diagnosis not present

## 2011-11-22 DIAGNOSIS — M109 Gout, unspecified: Secondary | ICD-10-CM | POA: Diagnosis not present

## 2011-11-22 DIAGNOSIS — I10 Essential (primary) hypertension: Secondary | ICD-10-CM | POA: Diagnosis not present

## 2011-11-29 DIAGNOSIS — M109 Gout, unspecified: Secondary | ICD-10-CM | POA: Diagnosis not present

## 2011-11-29 DIAGNOSIS — I10 Essential (primary) hypertension: Secondary | ICD-10-CM | POA: Diagnosis not present

## 2011-11-29 DIAGNOSIS — Z0000 Encounter for general adult medical examination without abnormal findings: Secondary | ICD-10-CM | POA: Diagnosis not present

## 2011-11-29 DIAGNOSIS — I2699 Other pulmonary embolism without acute cor pulmonale: Secondary | ICD-10-CM | POA: Diagnosis not present

## 2011-12-04 DIAGNOSIS — Z1212 Encounter for screening for malignant neoplasm of rectum: Secondary | ICD-10-CM | POA: Diagnosis not present

## 2011-12-06 DIAGNOSIS — Z7901 Long term (current) use of anticoagulants: Secondary | ICD-10-CM | POA: Diagnosis not present

## 2011-12-06 DIAGNOSIS — I2699 Other pulmonary embolism without acute cor pulmonale: Secondary | ICD-10-CM | POA: Diagnosis not present

## 2011-12-12 DIAGNOSIS — H52209 Unspecified astigmatism, unspecified eye: Secondary | ICD-10-CM | POA: Diagnosis not present

## 2011-12-12 DIAGNOSIS — H269 Unspecified cataract: Secondary | ICD-10-CM | POA: Diagnosis not present

## 2011-12-12 DIAGNOSIS — H2510 Age-related nuclear cataract, unspecified eye: Secondary | ICD-10-CM | POA: Diagnosis not present

## 2011-12-12 DIAGNOSIS — H40059 Ocular hypertension, unspecified eye: Secondary | ICD-10-CM | POA: Diagnosis not present

## 2011-12-20 DIAGNOSIS — I2699 Other pulmonary embolism without acute cor pulmonale: Secondary | ICD-10-CM | POA: Diagnosis not present

## 2011-12-20 DIAGNOSIS — Z7901 Long term (current) use of anticoagulants: Secondary | ICD-10-CM | POA: Diagnosis not present

## 2012-01-03 DIAGNOSIS — I2699 Other pulmonary embolism without acute cor pulmonale: Secondary | ICD-10-CM | POA: Diagnosis not present

## 2012-01-03 DIAGNOSIS — Z7901 Long term (current) use of anticoagulants: Secondary | ICD-10-CM | POA: Diagnosis not present

## 2012-01-10 DIAGNOSIS — L719 Rosacea, unspecified: Secondary | ICD-10-CM | POA: Diagnosis not present

## 2012-01-10 DIAGNOSIS — L820 Inflamed seborrheic keratosis: Secondary | ICD-10-CM | POA: Diagnosis not present

## 2012-01-10 DIAGNOSIS — L570 Actinic keratosis: Secondary | ICD-10-CM | POA: Diagnosis not present

## 2012-01-17 DIAGNOSIS — I2699 Other pulmonary embolism without acute cor pulmonale: Secondary | ICD-10-CM | POA: Diagnosis not present

## 2012-01-17 DIAGNOSIS — Z7901 Long term (current) use of anticoagulants: Secondary | ICD-10-CM | POA: Diagnosis not present

## 2012-01-22 DIAGNOSIS — Z7901 Long term (current) use of anticoagulants: Secondary | ICD-10-CM | POA: Diagnosis not present

## 2012-01-22 DIAGNOSIS — I2699 Other pulmonary embolism without acute cor pulmonale: Secondary | ICD-10-CM | POA: Diagnosis not present

## 2012-01-23 DIAGNOSIS — H2510 Age-related nuclear cataract, unspecified eye: Secondary | ICD-10-CM | POA: Diagnosis not present

## 2012-01-23 DIAGNOSIS — H25049 Posterior subcapsular polar age-related cataract, unspecified eye: Secondary | ICD-10-CM | POA: Diagnosis not present

## 2012-01-23 DIAGNOSIS — H40059 Ocular hypertension, unspecified eye: Secondary | ICD-10-CM | POA: Diagnosis not present

## 2012-01-23 DIAGNOSIS — H269 Unspecified cataract: Secondary | ICD-10-CM | POA: Diagnosis not present

## 2012-01-23 DIAGNOSIS — H25039 Anterior subcapsular polar age-related cataract, unspecified eye: Secondary | ICD-10-CM | POA: Diagnosis not present

## 2012-02-07 DIAGNOSIS — I2699 Other pulmonary embolism without acute cor pulmonale: Secondary | ICD-10-CM | POA: Diagnosis not present

## 2012-02-07 DIAGNOSIS — Z7901 Long term (current) use of anticoagulants: Secondary | ICD-10-CM | POA: Diagnosis not present

## 2012-02-11 IMAGING — CT CT ABDOMEN W/ CM
2 of 10 series · 13 of 46 positions shown, 18 images · IV contrast (omnipaque)
Comparison: Prior barium enema performed 03/24/2010.

CLINICAL DATA: Anastomotic leak in the sigmoid colon.  Now for
follow-up.

CT ABDOMEN AND PELVIS WITHOUT AND WITH CONTRAST
TECHNIQUE: Multidetector CT imaging of the abdomen and pelvis was
performed following the standard protocol before and following the
bolus administration of intravenous contrast.
Contrast: 100 ml Omnipaque 300 IV.

[Series 3: rtn a/p with · axial · 0.74mm/px · z∈[-417,-17]mm · 10 of 97 slices shown, 15 images]
[im 9/97  soft-tissue]
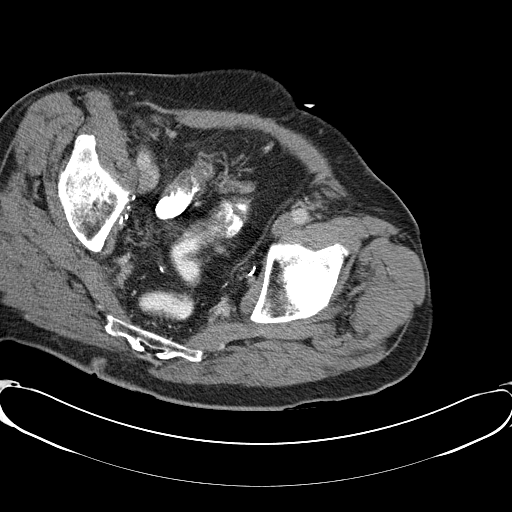
[im 9/97  bone]
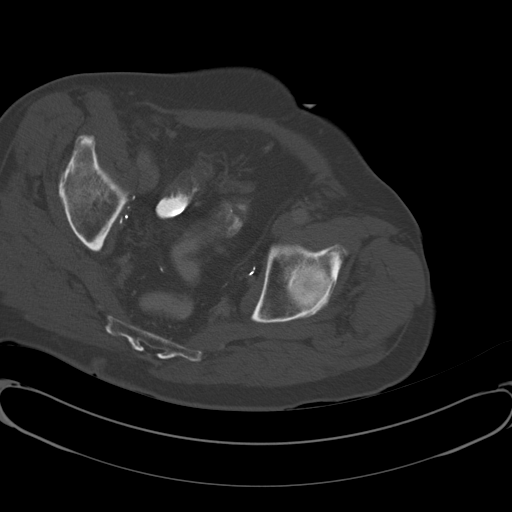
[im 17/97  soft-tissue]
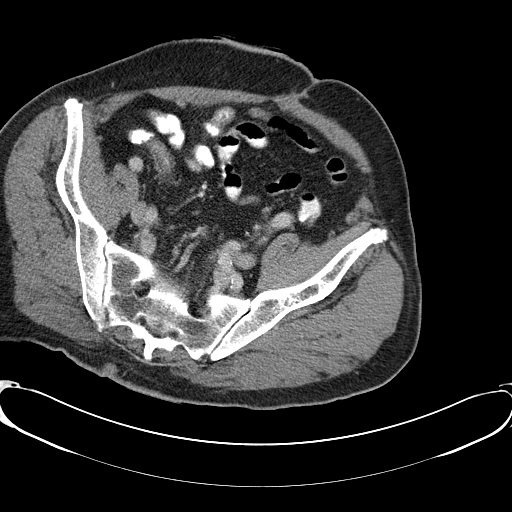
[im 33/97  soft-tissue]
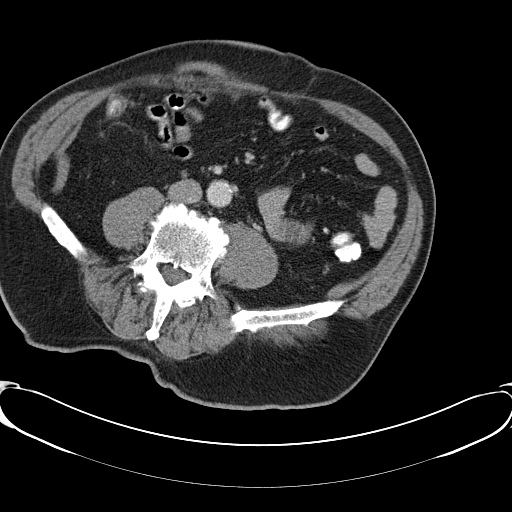
[im 41/97  soft-tissue]
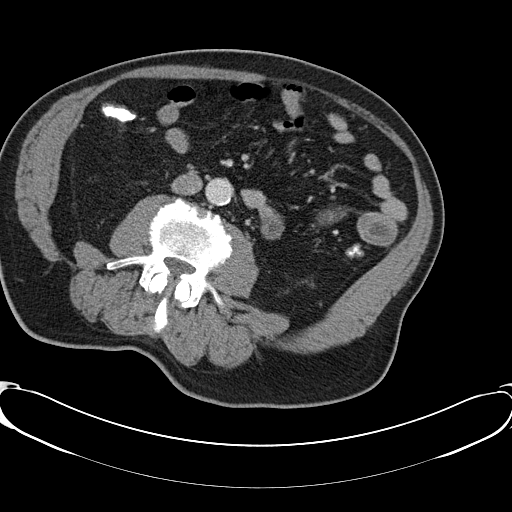
[im 49/97  soft-tissue]
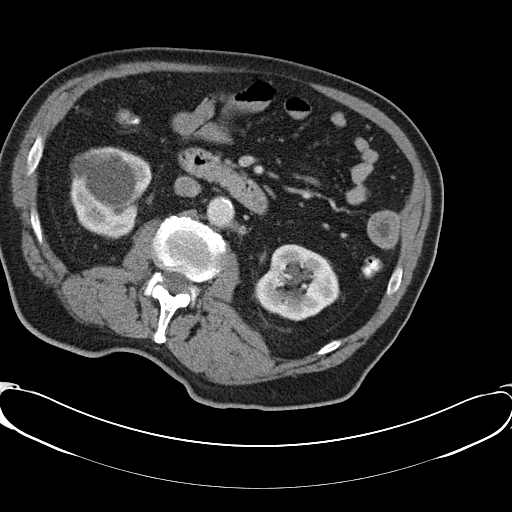
[im 57/97  soft-tissue]
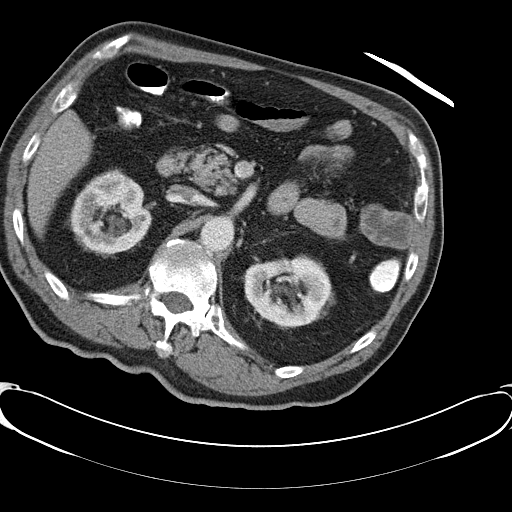
[im 65/97  soft-tissue]
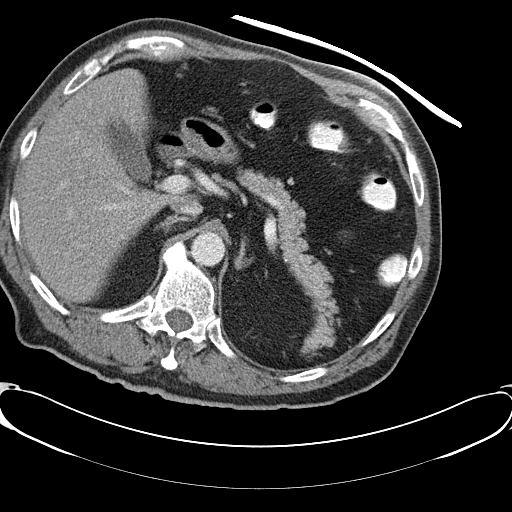
[im 65/97  lung]
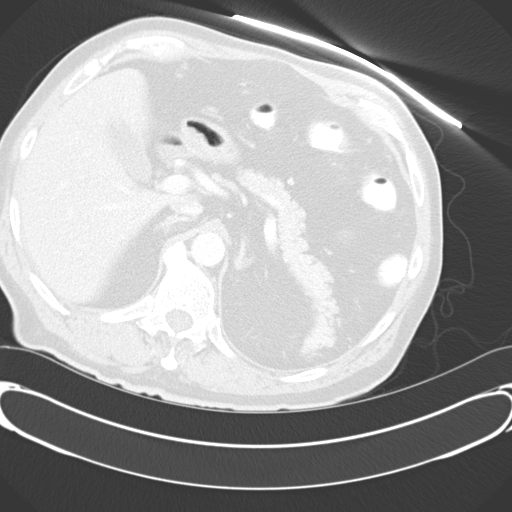
[im 73/97  lung]
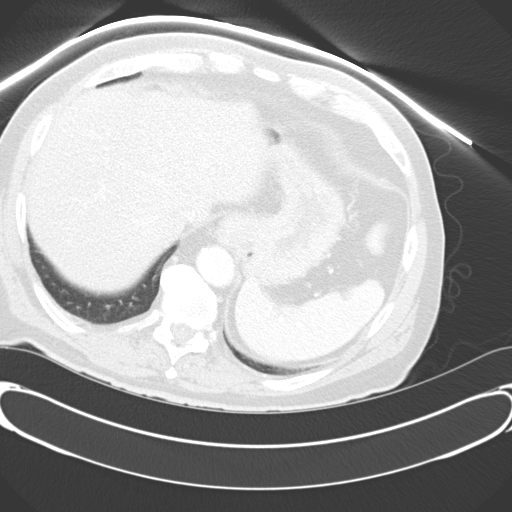
[im 81/97  soft-tissue]
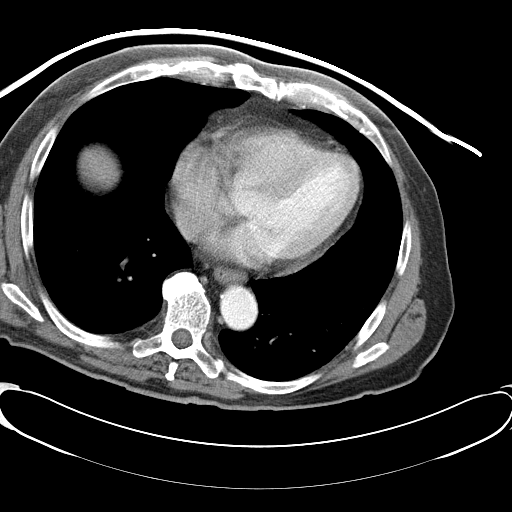
[im 81/97  lung]
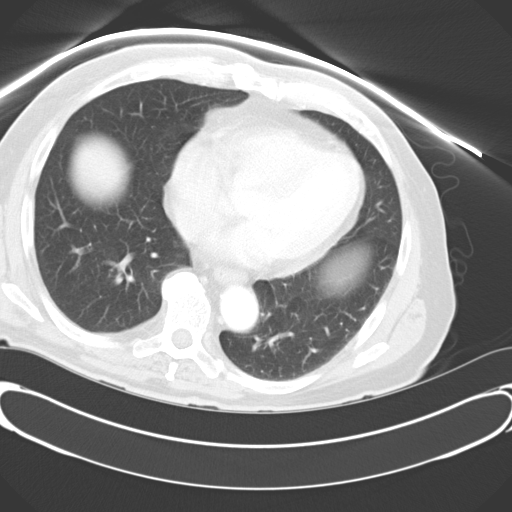
[im 89/97  soft-tissue]
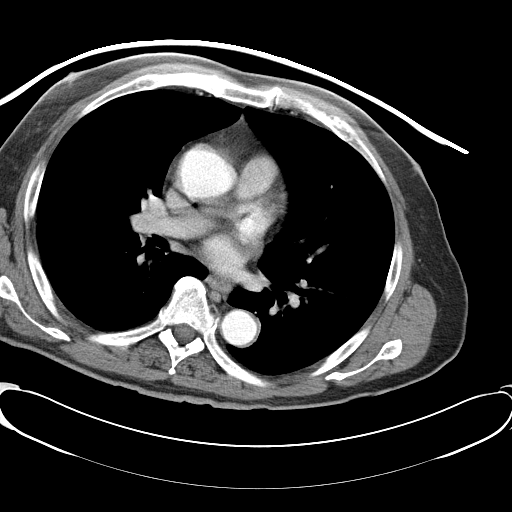
[im 89/97  lung]
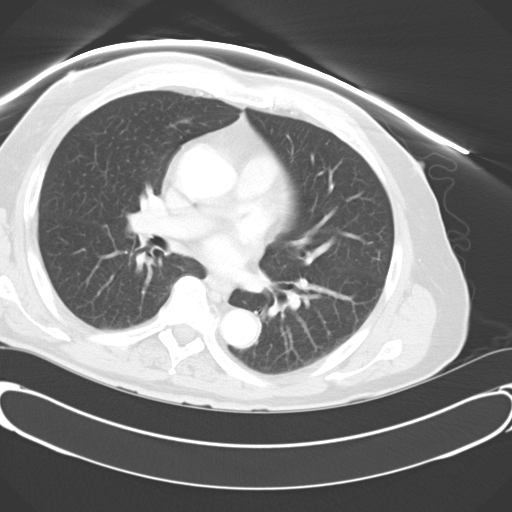
[im 89/97  bone]
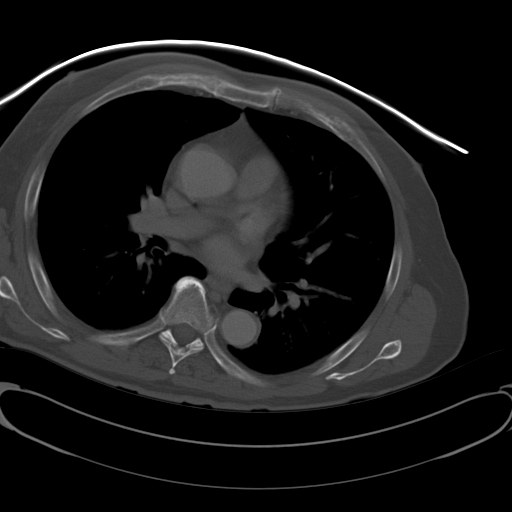

[Series 602: <mpr thick range> · coronal · 0.74mm/px · 3 of 84 slices shown]
[im 21/84  soft-tissue]
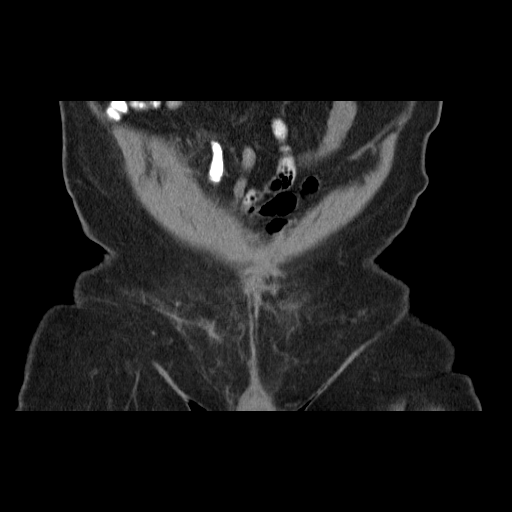
[im 42/84  soft-tissue]
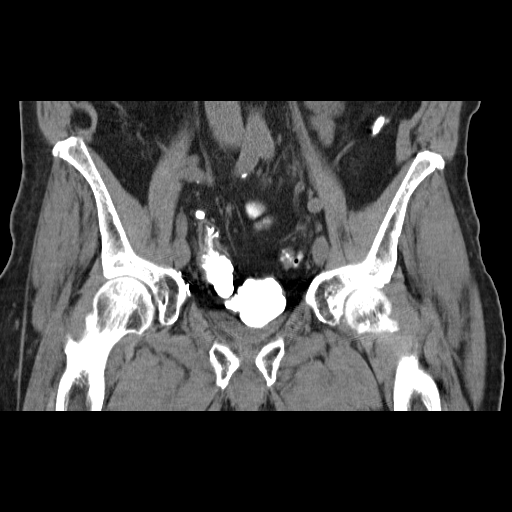
[im 63/84  soft-tissue]
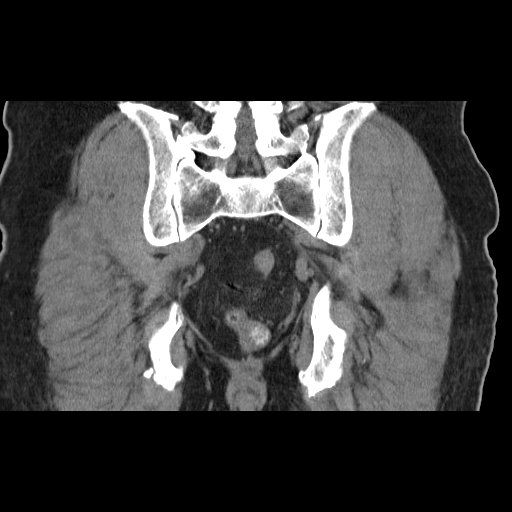

[13 of 46 positions shown; findings below may reference images not displayed]

FINDINGS: Imaging was performed through the pelvis prior to the
administration of IV and rectal contrast.  Oral contrast had
already been administered.  On these noncontrast images, there is
retained contrast is seen within the colon.  This is most dense in
the region of the cecum as seen on prior studies.  Small amount of
contrast seen in the remainder of the colon including numerous left-
sided diverticula.

Following IV and rectal contrast administration, imaging was
performed through the abdomen and pelvis.  I see no evidence of
residual or recurrent leak in the sigmoid colon.  There is marked
irregularity in the sigmoid colon, compatible with diverticulosis.

Small bowel is decompressed and grossly unremarkable.  Right lower
quadrant ileostomy is noted.

Liver, gallbladder, spleen, pancreas, adrenals are unremarkable.
Bilateral renal cysts, the largest being a right parapelvic cyst
measuring 5.1 cm.  Numerous parapelvic cysts on the left.  No free
fluid, free air or adenopathy.

Extensive degenerative changes in the lower lumbar spine and lower
thoracic spine. Lung bases are clear.  No effusions.  Heart is
normal size.
IMPRESSION: There is residual barium within the colon, particularly the cecum
where the contrast is quite dense and unchanged since prior study.

After administration of rectal contrast, I see no evidence of
anastomotic leak in the sigmoid colon.  There is extensive
diverticulosis and irregularity persisting in the sigmoid colon.

Bilateral renal parapelvic cysts.

## 2012-03-07 DIAGNOSIS — I2699 Other pulmonary embolism without acute cor pulmonale: Secondary | ICD-10-CM | POA: Diagnosis not present

## 2012-03-07 DIAGNOSIS — Z7901 Long term (current) use of anticoagulants: Secondary | ICD-10-CM | POA: Diagnosis not present

## 2012-03-08 DIAGNOSIS — L03319 Cellulitis of trunk, unspecified: Secondary | ICD-10-CM | POA: Diagnosis not present

## 2012-03-08 DIAGNOSIS — L02219 Cutaneous abscess of trunk, unspecified: Secondary | ICD-10-CM | POA: Diagnosis not present

## 2012-03-17 IMAGING — US US RENAL
1 series · 14 of 25 positions shown · non-contrast
Comparison: CT abdomen pelvis of 04/20/2010

CLINICAL DATA: Elevated creatinine, hypertension

RENAL/URINARY TRACT ULTRASOUND COMPLETE

[Series 1: us renal · 0.26mm/px · 14 of 45 slices shown]
[im 1/45]
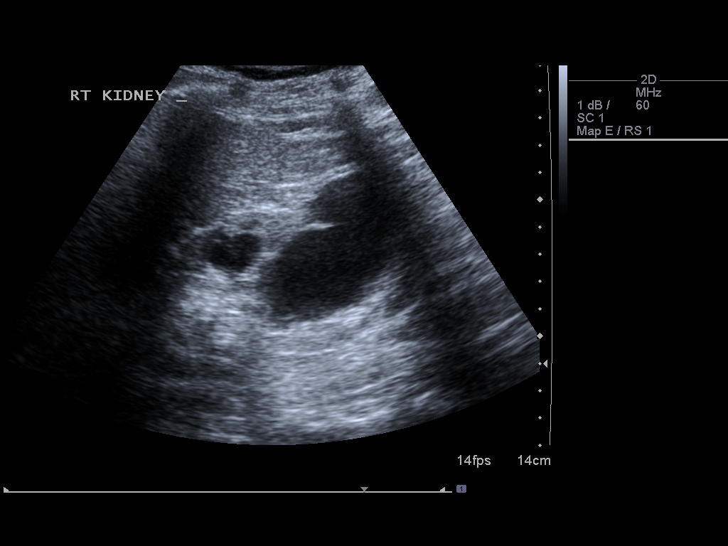
[im 4/45]
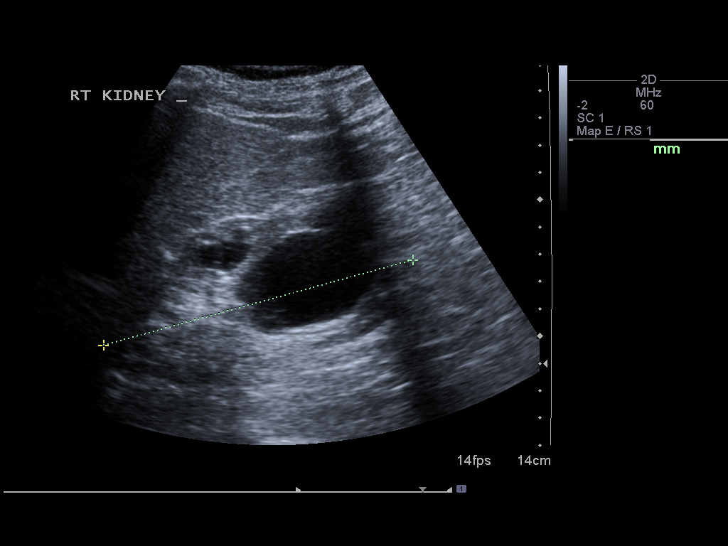
[im 8/45]
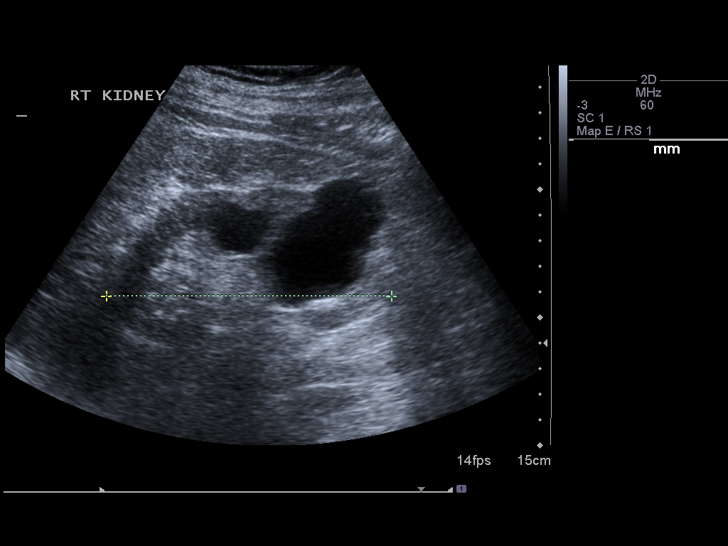
[im 12/45]
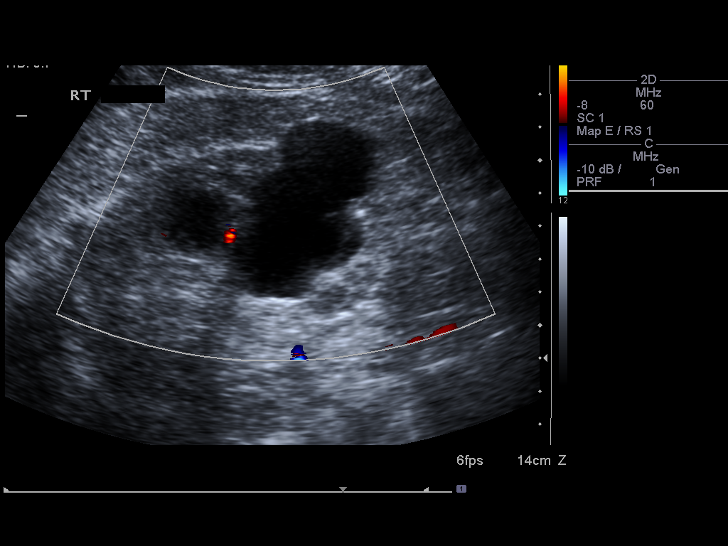
[im 15/45]
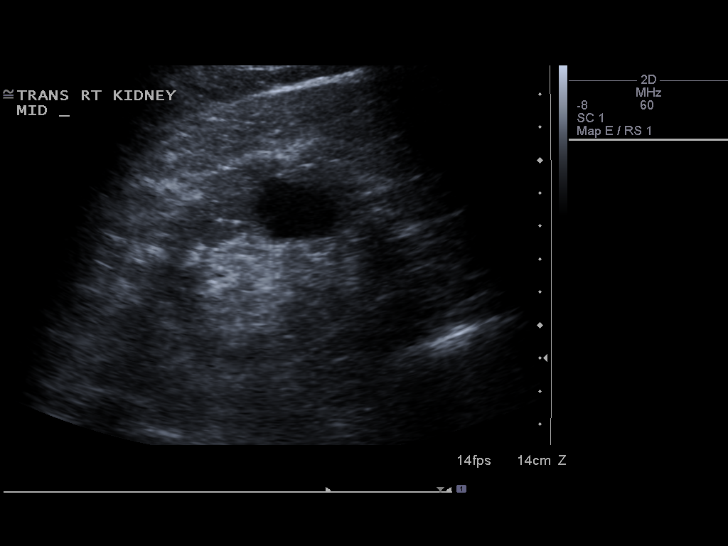
[im 17/45]
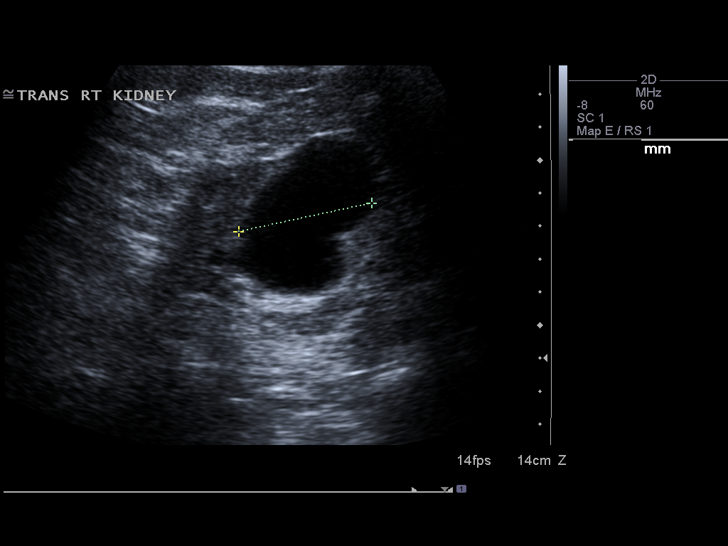
[im 21/45]
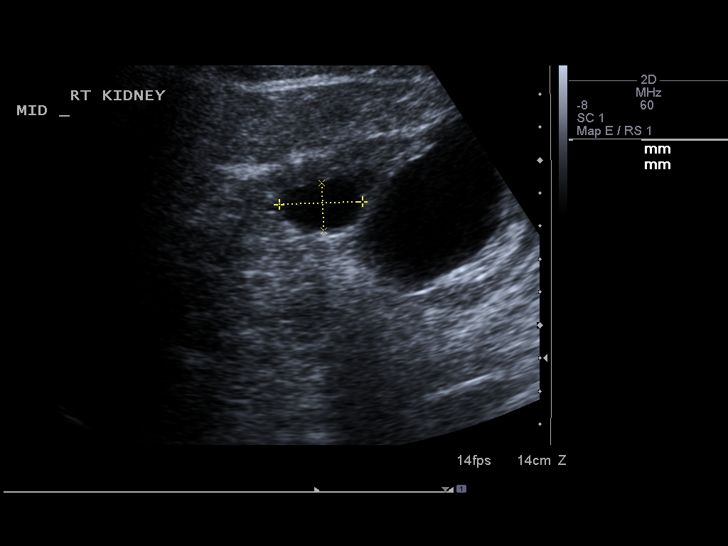
[im 24/45]
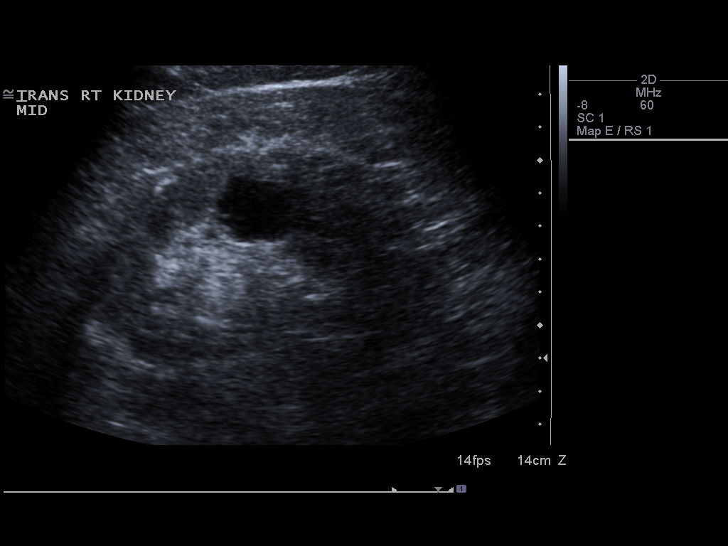
[im 28/45]
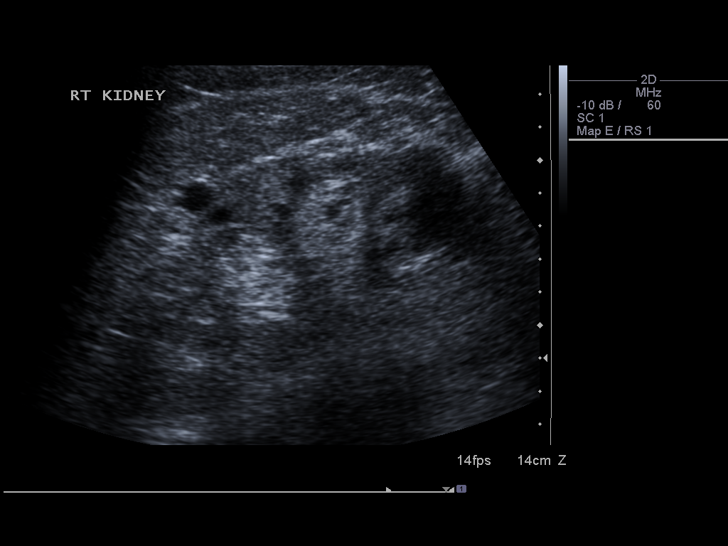
[im 30/45]
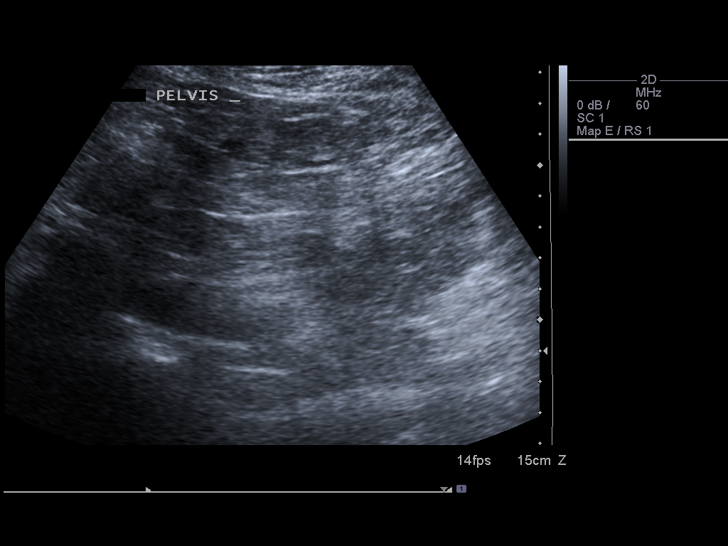
[im 34/45]
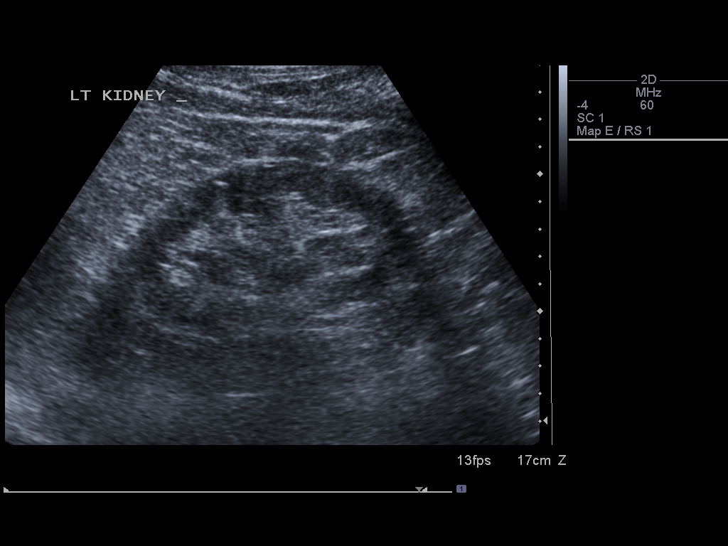
[im 37/45]
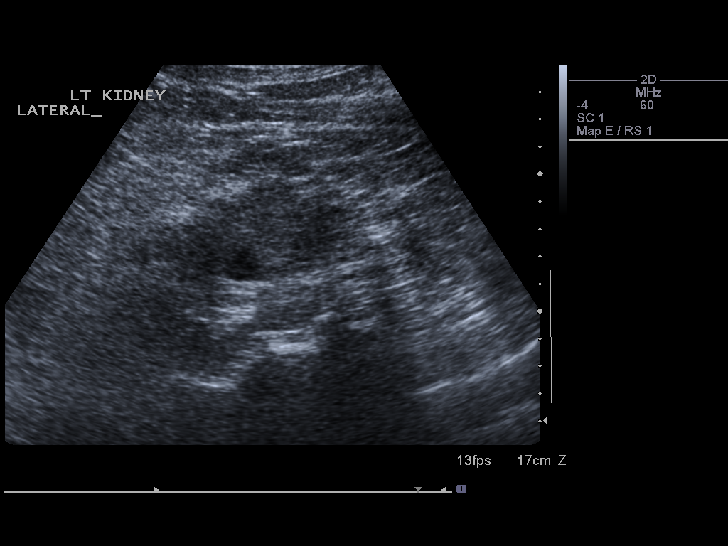
[im 41/45]
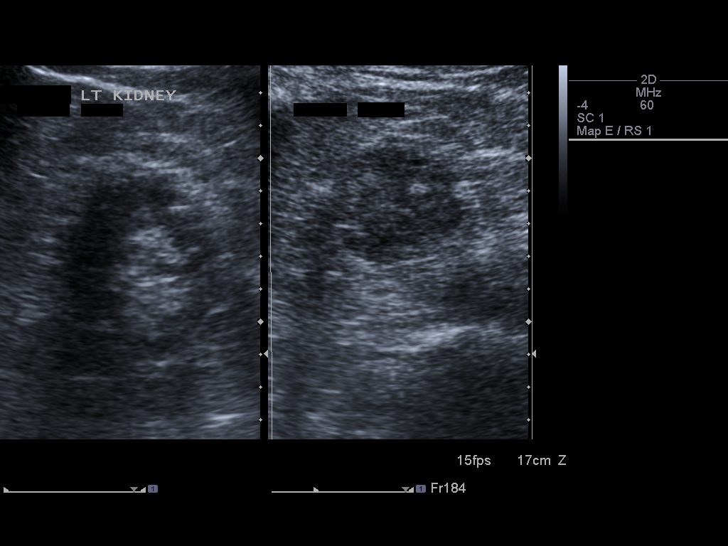
[im 45/45]
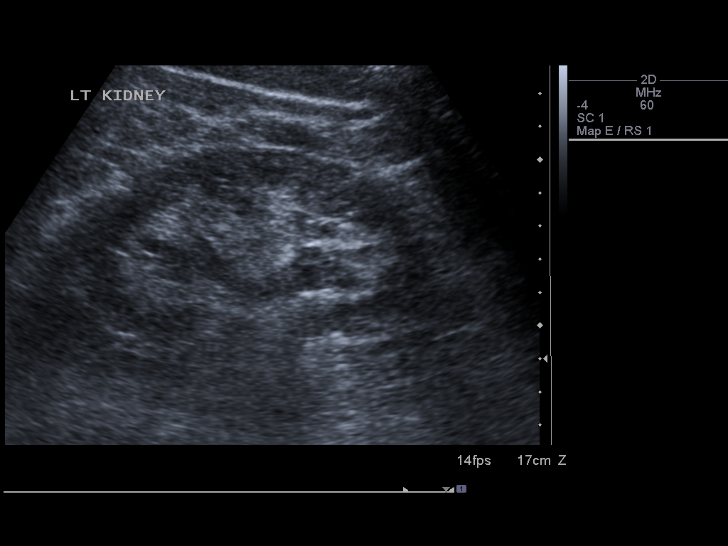

[14 of 25 positions shown; findings below may reference images not displayed]

FINDINGS: Right Kidney:  No hydronephrosis is seen.  The right kidney
measures 11.8 cm sagittally.  Multiple cysts are present, largest
in the lower pole of 5.9 x 2.6 x 4.1 cm.  Some cortical thinning is
present. The renal parenchyma may be slightly echogenic which may
indicate chronic renal medical disease.

Left Kidney:  No hydronephrosis and the left kidney measures
cm.  Some cortical thinning is present and the parenchyma may be
slightly echogenic suggesting chronic renal medical disease.

Bladder:  The urinary bladder is not well distended.
IMPRESSION: 1.  No hydronephrosis.
2.  Slightly echogenic renal parenchyma may indicate chronic renal
medical disease.
3.  Renal cysts bilaterally.

## 2012-03-20 DIAGNOSIS — L0292 Furuncle, unspecified: Secondary | ICD-10-CM | POA: Diagnosis not present

## 2012-04-10 DIAGNOSIS — L408 Other psoriasis: Secondary | ICD-10-CM | POA: Diagnosis not present

## 2012-04-10 DIAGNOSIS — L708 Other acne: Secondary | ICD-10-CM | POA: Diagnosis not present

## 2012-04-10 DIAGNOSIS — L570 Actinic keratosis: Secondary | ICD-10-CM | POA: Diagnosis not present

## 2012-04-10 DIAGNOSIS — Z85828 Personal history of other malignant neoplasm of skin: Secondary | ICD-10-CM | POA: Diagnosis not present

## 2012-04-11 DIAGNOSIS — I2699 Other pulmonary embolism without acute cor pulmonale: Secondary | ICD-10-CM | POA: Diagnosis not present

## 2012-04-11 DIAGNOSIS — Z7901 Long term (current) use of anticoagulants: Secondary | ICD-10-CM | POA: Diagnosis not present

## 2012-04-24 ENCOUNTER — Encounter (HOSPITAL_COMMUNITY): Payer: Self-pay | Admitting: *Deleted

## 2012-04-24 ENCOUNTER — Emergency Department (HOSPITAL_COMMUNITY)
Admission: EM | Admit: 2012-04-24 | Discharge: 2012-04-24 | Disposition: A | Discharge status: Home / Self Care | Payer: Medicare Other | Attending: Emergency Medicine | Admitting: Emergency Medicine

## 2012-04-24 DIAGNOSIS — L0390 Cellulitis, unspecified: Secondary | ICD-10-CM

## 2012-04-24 DIAGNOSIS — L905 Scar conditions and fibrosis of skin: Secondary | ICD-10-CM | POA: Insufficient documentation

## 2012-04-24 DIAGNOSIS — R109 Unspecified abdominal pain: Secondary | ICD-10-CM | POA: Insufficient documentation

## 2012-04-24 DIAGNOSIS — L03319 Cellulitis of trunk, unspecified: Secondary | ICD-10-CM | POA: Diagnosis not present

## 2012-04-24 DIAGNOSIS — L02219 Cutaneous abscess of trunk, unspecified: Secondary | ICD-10-CM | POA: Diagnosis not present

## 2012-04-24 DIAGNOSIS — I10 Essential (primary) hypertension: Secondary | ICD-10-CM | POA: Insufficient documentation

## 2012-04-24 HISTORY — DX: Diverticulitis of intestine, part unspecified, without perforation or abscess without bleeding: K57.92

## 2012-04-24 MED ORDER — CLINDAMYCIN HCL 150 MG PO CAPS: 150.0000 mg | ORAL_CAPSULE | Freq: Four times a day (QID) | ORAL | Status: AC

## 2012-04-24 NOTE — ED Provider Notes (Signed)
History     CSN: [id number]  Arrival date & time 04/24/12  4098   First MD Initiated Contact with Patient 04/24/12 0405      Chief Complaint  Patient presents with  . Abdominal Pain    (Consider location/radiation/quality/duration/timing/severity/associated sxs/prior treatment) Patient is a 78 y.o. male presenting with abdominal pain. The history is provided by the patient.  Abdominal Pain The primary symptoms of the illness include abdominal pain.   patient here with right-sided abdominal pain which started 2 days ago. History of similar symptoms associated with scar tissue formation from a prior ileostomy. No fever or, vomiting, diarrhea, urinary symptoms. He has some redness at the scar site. No drainage from the scar site. No medications taken for this prior to arrival. Pain is dull and described as constant.  Past Medical History  Diagnosis Date  . Hypertension   . Incontinence   . Diverticulitis     Past Surgical History  Procedure Date  . Hernia repair 1999  . Prostate removed 1992  . Colon surgery     No family history on file.  History  Substance Use Topics  . Smoking status: Never Smoker   . Smokeless tobacco: Not on file  . Alcohol Use: No      Review of Systems  Gastrointestinal: Positive for abdominal pain.  All other systems reviewed and are negative.    Allergies  Sulfonamide derivatives  Home Medications   Current Outpatient Rx  Name Route Sig Dispense Refill  . ALLOPURINOL 300 MG PO TABS Oral Take 300 mg by mouth daily.      Marland Kitchen CARVEDILOL 6.25 MG PO TABS Oral Take 6.25 mg by mouth 2 (two) times daily with a meal.      . CENTRUM SILVER PO Oral Take by mouth.        BP 149/103  Pulse 70  Temp 97.4 F (36.3 C) (Oral)  Resp 18  SpO2 98%  Physical Exam  Nursing note and vitals reviewed. Constitutional: He is oriented to person, place, and time. He appears well-developed and well-nourished.  Non-toxic appearance. No distress.  HENT:    Head: Normocephalic and atraumatic.  Eyes: Conjunctivae, EOM and lids are normal. Pupils are equal, round, and reactive to light.  Neck: Normal range of motion. Neck supple. No tracheal deviation present. No mass present.  Cardiovascular: Normal rate, regular rhythm and normal heart sounds.  Exam reveals no gallop.   No murmur heard. Pulmonary/Chest: Effort normal and breath sounds normal. No stridor. No respiratory distress. He has no decreased breath sounds. He has no wheezes. He has no rhonchi. He has no rales.  Abdominal: Soft. Normal appearance and bowel sounds are normal. He exhibits no distension. There is no tenderness. There is no rebound and no CVA tenderness.       dime sized area of erythema at the right lower quadrant without fluctuance. No obvious drainage. Scar tissue noted.  Musculoskeletal: Normal range of motion. He exhibits no edema and no tenderness.  Neurological: He is alert and oriented to person, place, and time. He has normal strength. No cranial nerve deficit or sensory deficit. GCS eye subscore is 4. GCS verbal subscore is 5. GCS motor subscore is 6.  Skin: Skin is warm and dry. No abrasion and no rash noted.  Psychiatric: He has a normal mood and affect. His speech is normal and behavior is normal.    ED Course  Procedures (including critical care time)  Labs Reviewed - No data to  display No results found.   No diagnosis found.    MDM  Patient without surgical abdomen at this time. Wound could possibly be early cellulitis and we'll treat as such. Patient given return instructions        Toy Baker, MD 04/24/12 [phone]

## 2012-04-24 NOTE — ED Notes (Signed)
Pt had ileostomy with reversal in 2011.  States for 2 days had soreness lower right abd

## 2012-05-01 DIAGNOSIS — I10 Essential (primary) hypertension: Secondary | ICD-10-CM | POA: Diagnosis not present

## 2012-05-01 DIAGNOSIS — L03319 Cellulitis of trunk, unspecified: Secondary | ICD-10-CM | POA: Diagnosis not present

## 2012-05-01 DIAGNOSIS — C61 Malignant neoplasm of prostate: Secondary | ICD-10-CM | POA: Diagnosis not present

## 2012-05-01 DIAGNOSIS — I2699 Other pulmonary embolism without acute cor pulmonale: Secondary | ICD-10-CM | POA: Diagnosis not present

## 2012-05-01 DIAGNOSIS — Z7901 Long term (current) use of anticoagulants: Secondary | ICD-10-CM | POA: Diagnosis not present

## 2012-05-06 DIAGNOSIS — I2699 Other pulmonary embolism without acute cor pulmonale: Secondary | ICD-10-CM | POA: Diagnosis not present

## 2012-05-06 DIAGNOSIS — Z7901 Long term (current) use of anticoagulants: Secondary | ICD-10-CM | POA: Diagnosis not present

## 2012-05-10 DIAGNOSIS — R197 Diarrhea, unspecified: Secondary | ICD-10-CM | POA: Diagnosis not present

## 2012-05-10 DIAGNOSIS — L02219 Cutaneous abscess of trunk, unspecified: Secondary | ICD-10-CM | POA: Diagnosis not present

## 2012-05-16 DIAGNOSIS — C61 Malignant neoplasm of prostate: Secondary | ICD-10-CM | POA: Diagnosis not present

## 2012-05-16 DIAGNOSIS — R32 Unspecified urinary incontinence: Secondary | ICD-10-CM | POA: Diagnosis not present

## 2012-05-22 DIAGNOSIS — L570 Actinic keratosis: Secondary | ICD-10-CM | POA: Diagnosis not present

## 2012-05-22 DIAGNOSIS — L408 Other psoriasis: Secondary | ICD-10-CM | POA: Diagnosis not present

## 2012-05-22 DIAGNOSIS — L738 Other specified follicular disorders: Secondary | ICD-10-CM | POA: Diagnosis not present

## 2012-05-30 DIAGNOSIS — M109 Gout, unspecified: Secondary | ICD-10-CM | POA: Diagnosis not present

## 2012-05-30 DIAGNOSIS — I2699 Other pulmonary embolism without acute cor pulmonale: Secondary | ICD-10-CM | POA: Diagnosis not present

## 2012-05-30 DIAGNOSIS — I10 Essential (primary) hypertension: Secondary | ICD-10-CM | POA: Diagnosis not present

## 2012-05-30 DIAGNOSIS — Z7901 Long term (current) use of anticoagulants: Secondary | ICD-10-CM | POA: Diagnosis not present

## 2012-06-27 DIAGNOSIS — Z7901 Long term (current) use of anticoagulants: Secondary | ICD-10-CM | POA: Diagnosis not present

## 2012-06-27 DIAGNOSIS — I2699 Other pulmonary embolism without acute cor pulmonale: Secondary | ICD-10-CM | POA: Diagnosis not present

## 2012-07-25 DIAGNOSIS — I2699 Other pulmonary embolism without acute cor pulmonale: Secondary | ICD-10-CM | POA: Diagnosis not present

## 2012-07-25 DIAGNOSIS — Z23 Encounter for immunization: Secondary | ICD-10-CM | POA: Diagnosis not present

## 2012-07-25 DIAGNOSIS — Z7901 Long term (current) use of anticoagulants: Secondary | ICD-10-CM | POA: Diagnosis not present

## 2012-08-06 DIAGNOSIS — L708 Other acne: Secondary | ICD-10-CM | POA: Diagnosis not present

## 2012-08-06 DIAGNOSIS — C44319 Basal cell carcinoma of skin of other parts of face: Secondary | ICD-10-CM | POA: Diagnosis not present

## 2012-08-20 ENCOUNTER — Ambulatory Visit (HOSPITAL_COMMUNITY)
Admission: RE | Admit: 2012-08-20 | Discharge: 2012-08-20 | Disposition: A | Discharge status: Home / Self Care | Payer: Medicare Other | Source: Ambulatory Visit | Attending: Internal Medicine | Admitting: Internal Medicine

## 2012-08-20 DIAGNOSIS — Z8546 Personal history of malignant neoplasm of prostate: Secondary | ICD-10-CM | POA: Diagnosis not present

## 2012-08-20 DIAGNOSIS — R609 Edema, unspecified: Secondary | ICD-10-CM | POA: Insufficient documentation

## 2012-08-20 DIAGNOSIS — M2550 Pain in unspecified joint: Secondary | ICD-10-CM

## 2012-08-20 DIAGNOSIS — R600 Localized edema: Secondary | ICD-10-CM

## 2012-08-20 DIAGNOSIS — R229 Localized swelling, mass and lump, unspecified: Secondary | ICD-10-CM

## 2012-08-20 DIAGNOSIS — Z7901 Long term (current) use of anticoagulants: Secondary | ICD-10-CM | POA: Diagnosis not present

## 2012-08-20 NOTE — Progress Notes (Signed)
Left:  No evidence of DVT or superficial thrombosis.  There appears to be a ruptured Baker's cyst in the left pop fossa.  Right:  Negative for DVT in the common femoral vein.

## 2012-08-27 DIAGNOSIS — L570 Actinic keratosis: Secondary | ICD-10-CM | POA: Diagnosis not present

## 2012-08-28 DIAGNOSIS — Z7901 Long term (current) use of anticoagulants: Secondary | ICD-10-CM | POA: Diagnosis not present

## 2012-08-28 DIAGNOSIS — I2699 Other pulmonary embolism without acute cor pulmonale: Secondary | ICD-10-CM | POA: Diagnosis not present

## 2012-09-17 DIAGNOSIS — L570 Actinic keratosis: Secondary | ICD-10-CM | POA: Diagnosis not present

## 2012-09-17 DIAGNOSIS — L820 Inflamed seborrheic keratosis: Secondary | ICD-10-CM | POA: Diagnosis not present

## 2012-09-17 DIAGNOSIS — L738 Other specified follicular disorders: Secondary | ICD-10-CM | POA: Diagnosis not present

## 2012-09-17 DIAGNOSIS — C44319 Basal cell carcinoma of skin of other parts of face: Secondary | ICD-10-CM | POA: Diagnosis not present

## 2012-10-03 DIAGNOSIS — I2699 Other pulmonary embolism without acute cor pulmonale: Secondary | ICD-10-CM | POA: Diagnosis not present

## 2012-10-03 DIAGNOSIS — Z7901 Long term (current) use of anticoagulants: Secondary | ICD-10-CM | POA: Diagnosis not present

## 2012-10-24 DIAGNOSIS — Z7901 Long term (current) use of anticoagulants: Secondary | ICD-10-CM | POA: Diagnosis not present

## 2012-10-24 DIAGNOSIS — L0293 Carbuncle, unspecified: Secondary | ICD-10-CM | POA: Diagnosis not present

## 2012-10-24 DIAGNOSIS — R197 Diarrhea, unspecified: Secondary | ICD-10-CM | POA: Diagnosis not present

## 2012-10-26 IMAGING — CR DG CHEST 2V
2 series · 2 of 2 positions shown · non-contrast
Comparison: 10/29/2009

CLINICAL DATA: Preoperative evaluation for ventral hernia repair.
Hypertension.  No current chest complaints

CHEST - 2 VIEW

[w chest pa]
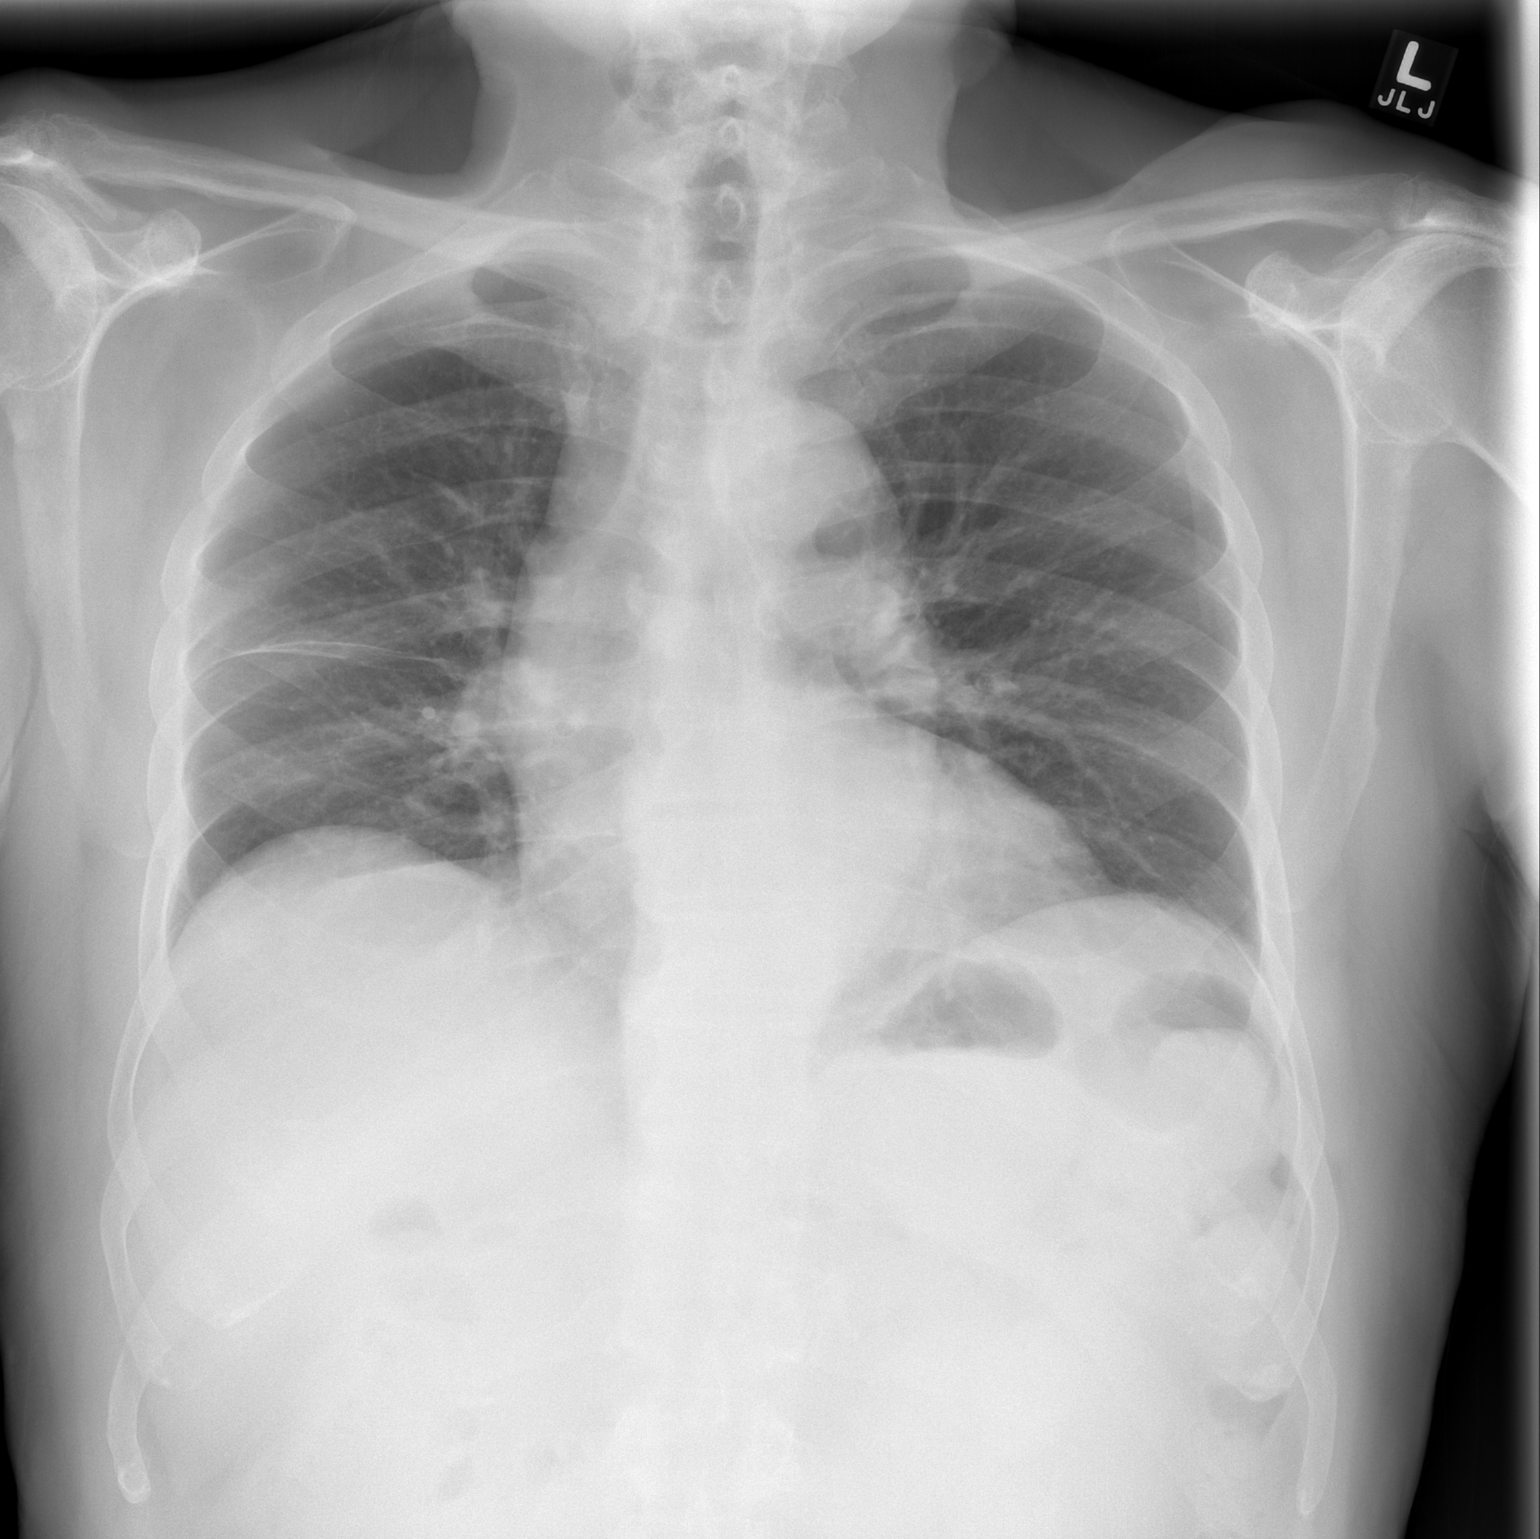

[w chest lat]
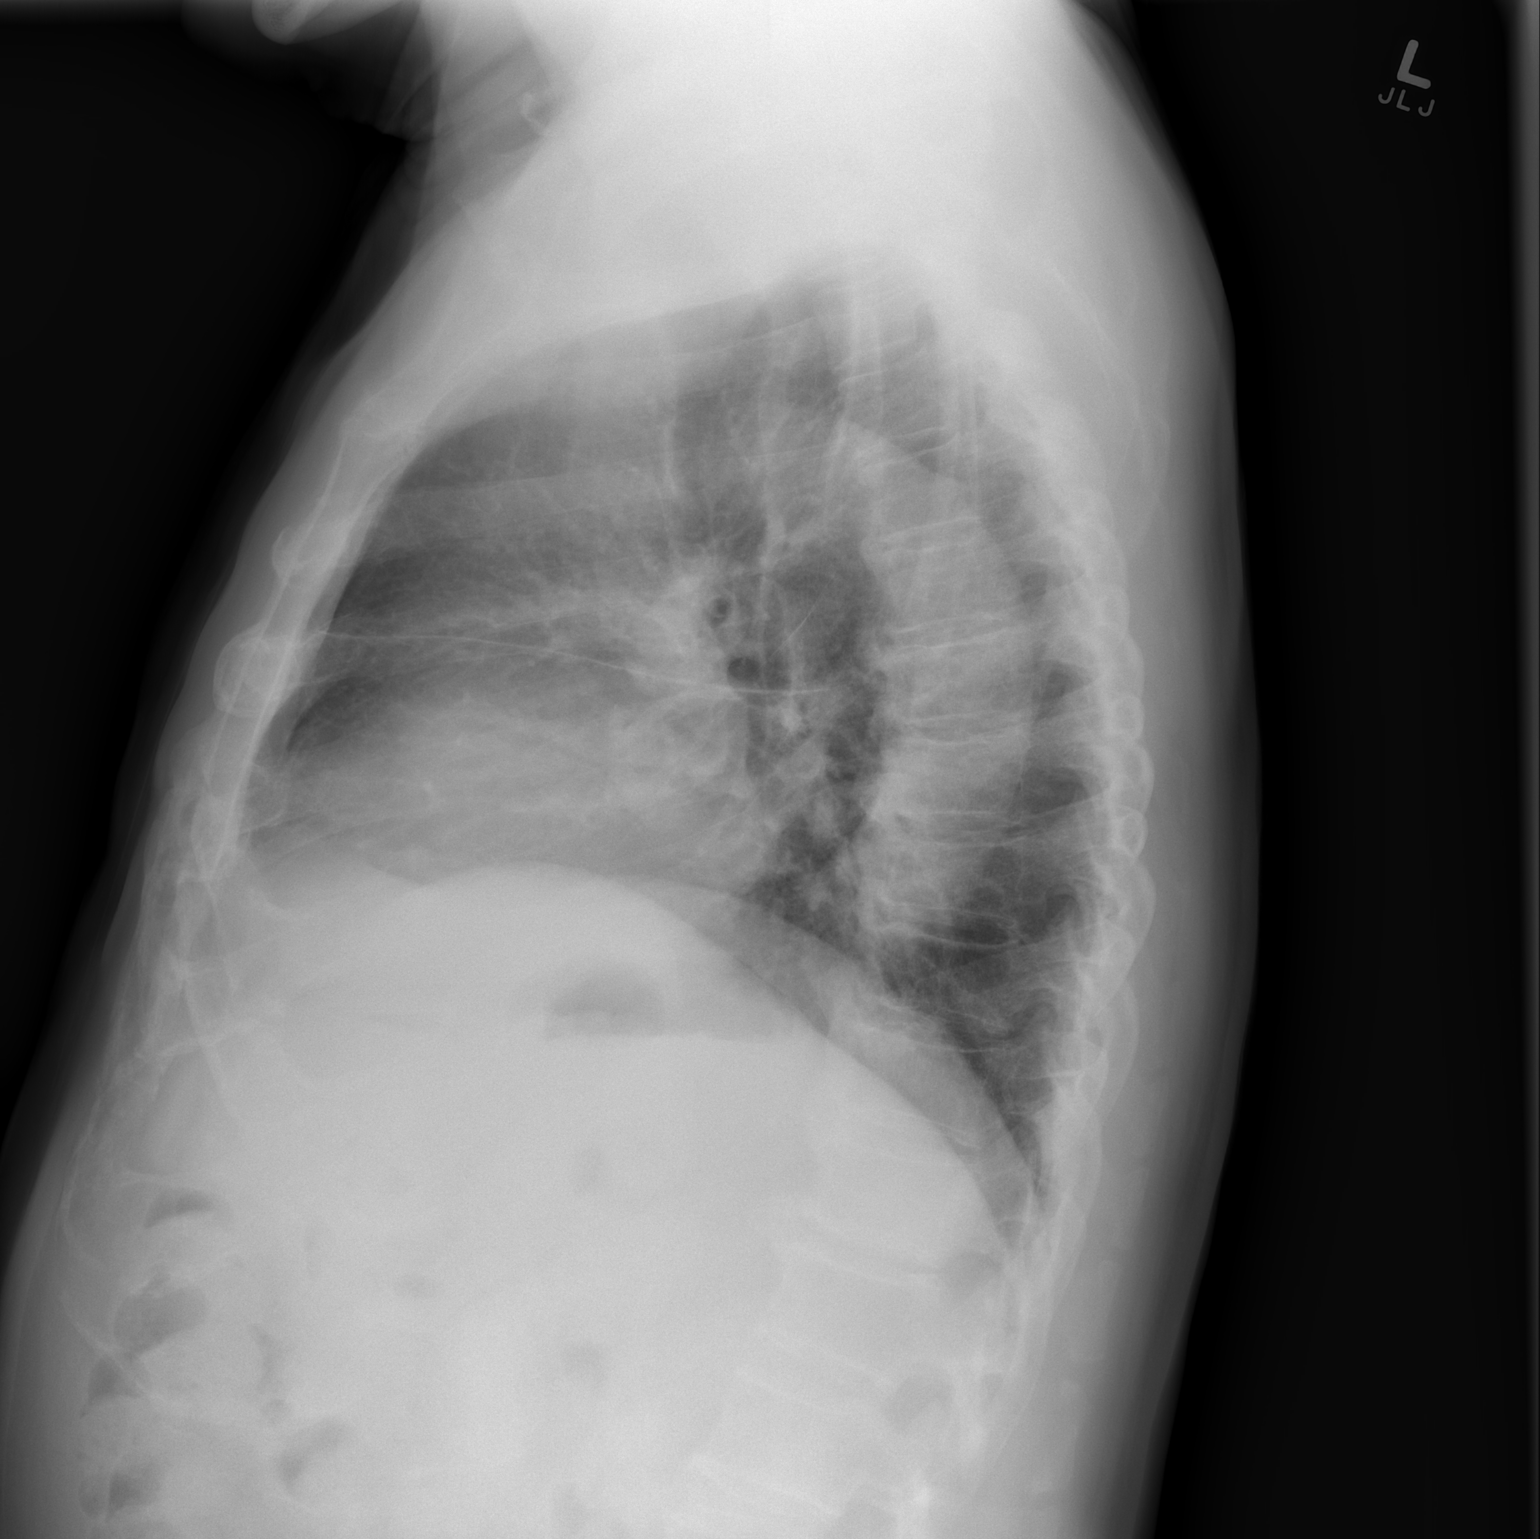

[2 of 2 positions shown; findings below may reference images not displayed]

FINDINGS: Low lung volumes are present and taking this into
consideration heart size is within normal limits and stable.
Ectasia of the thoracic aorta is noted and compatible with the
patient's history of hypertension.

The lung fields are clear with no signs of focal infiltrate or
congestive failure.  Some mild stable peribronchial cuffing is
seen.  Prominence of the minor fissure has increased since the
prior exam with no evidence for associated pleural fluid or
interstitial septal lines to suggest early interstitial edema,
suggesting that this new finding may be due to scarring or post
infectious in nature.

Bony structures demonstrate diffuse stable degenerative changes of
the thoracic spine.
IMPRESSION: New prominence of the minor fissure is noted with no findings to
suggest current pleural fluid or developing interstitial fluid.
This is felt most likely to be related to post infectious or other
scarring change.  Otherwise stable with no worrisome focal or acute
abnormality suggested

## 2012-10-29 DIAGNOSIS — C44319 Basal cell carcinoma of skin of other parts of face: Secondary | ICD-10-CM | POA: Diagnosis not present

## 2012-10-29 DIAGNOSIS — L259 Unspecified contact dermatitis, unspecified cause: Secondary | ICD-10-CM | POA: Diagnosis not present

## 2012-10-29 DIAGNOSIS — D235 Other benign neoplasm of skin of trunk: Secondary | ICD-10-CM | POA: Diagnosis not present

## 2012-10-29 DIAGNOSIS — L570 Actinic keratosis: Secondary | ICD-10-CM | POA: Diagnosis not present

## 2012-11-06 DIAGNOSIS — Z7901 Long term (current) use of anticoagulants: Secondary | ICD-10-CM | POA: Diagnosis not present

## 2012-11-06 DIAGNOSIS — I2699 Other pulmonary embolism without acute cor pulmonale: Secondary | ICD-10-CM | POA: Diagnosis not present

## 2012-11-26 DIAGNOSIS — Z125 Encounter for screening for malignant neoplasm of prostate: Secondary | ICD-10-CM | POA: Diagnosis not present

## 2012-11-26 DIAGNOSIS — M109 Gout, unspecified: Secondary | ICD-10-CM | POA: Diagnosis not present

## 2012-11-26 DIAGNOSIS — Z79899 Other long term (current) drug therapy: Secondary | ICD-10-CM | POA: Diagnosis not present

## 2012-11-26 DIAGNOSIS — I10 Essential (primary) hypertension: Secondary | ICD-10-CM | POA: Diagnosis not present

## 2012-11-27 DIAGNOSIS — H40059 Ocular hypertension, unspecified eye: Secondary | ICD-10-CM | POA: Diagnosis not present

## 2012-11-27 DIAGNOSIS — Z961 Presence of intraocular lens: Secondary | ICD-10-CM | POA: Diagnosis not present

## 2012-12-03 DIAGNOSIS — Z0000 Encounter for general adult medical examination without abnormal findings: Secondary | ICD-10-CM | POA: Diagnosis not present

## 2012-12-03 DIAGNOSIS — Z7901 Long term (current) use of anticoagulants: Secondary | ICD-10-CM | POA: Diagnosis not present

## 2012-12-03 DIAGNOSIS — L0292 Furuncle, unspecified: Secondary | ICD-10-CM | POA: Diagnosis not present

## 2012-12-03 DIAGNOSIS — L0293 Carbuncle, unspecified: Secondary | ICD-10-CM | POA: Diagnosis not present

## 2012-12-03 DIAGNOSIS — I10 Essential (primary) hypertension: Secondary | ICD-10-CM | POA: Diagnosis not present

## 2012-12-03 DIAGNOSIS — M109 Gout, unspecified: Secondary | ICD-10-CM | POA: Diagnosis not present

## 2012-12-03 DIAGNOSIS — Z125 Encounter for screening for malignant neoplasm of prostate: Secondary | ICD-10-CM | POA: Diagnosis not present

## 2012-12-04 DIAGNOSIS — L570 Actinic keratosis: Secondary | ICD-10-CM | POA: Diagnosis not present

## 2012-12-04 DIAGNOSIS — Z85828 Personal history of other malignant neoplasm of skin: Secondary | ICD-10-CM | POA: Diagnosis not present

## 2012-12-04 DIAGNOSIS — L738 Other specified follicular disorders: Secondary | ICD-10-CM | POA: Diagnosis not present

## 2012-12-04 DIAGNOSIS — C44211 Basal cell carcinoma of skin of unspecified ear and external auricular canal: Secondary | ICD-10-CM | POA: Diagnosis not present

## 2012-12-09 DIAGNOSIS — Z1212 Encounter for screening for malignant neoplasm of rectum: Secondary | ICD-10-CM | POA: Diagnosis not present

## 2013-01-01 DIAGNOSIS — L570 Actinic keratosis: Secondary | ICD-10-CM | POA: Diagnosis not present

## 2013-01-01 DIAGNOSIS — C44319 Basal cell carcinoma of skin of other parts of face: Secondary | ICD-10-CM | POA: Diagnosis not present

## 2013-01-01 DIAGNOSIS — L719 Rosacea, unspecified: Secondary | ICD-10-CM | POA: Diagnosis not present

## 2013-01-01 DIAGNOSIS — Z85828 Personal history of other malignant neoplasm of skin: Secondary | ICD-10-CM | POA: Diagnosis not present

## 2013-01-15 DIAGNOSIS — I2699 Other pulmonary embolism without acute cor pulmonale: Secondary | ICD-10-CM | POA: Diagnosis not present

## 2013-01-15 DIAGNOSIS — Z7901 Long term (current) use of anticoagulants: Secondary | ICD-10-CM | POA: Diagnosis not present

## 2013-02-12 DIAGNOSIS — L570 Actinic keratosis: Secondary | ICD-10-CM | POA: Diagnosis not present

## 2013-02-12 DIAGNOSIS — C44319 Basal cell carcinoma of skin of other parts of face: Secondary | ICD-10-CM | POA: Diagnosis not present

## 2013-02-12 DIAGNOSIS — L719 Rosacea, unspecified: Secondary | ICD-10-CM | POA: Diagnosis not present

## 2013-02-12 DIAGNOSIS — L219 Seborrheic dermatitis, unspecified: Secondary | ICD-10-CM | POA: Diagnosis not present

## 2013-02-18 DIAGNOSIS — I2699 Other pulmonary embolism without acute cor pulmonale: Secondary | ICD-10-CM | POA: Diagnosis not present

## 2013-02-18 DIAGNOSIS — Z7901 Long term (current) use of anticoagulants: Secondary | ICD-10-CM | POA: Diagnosis not present

## 2013-03-19 DIAGNOSIS — L570 Actinic keratosis: Secondary | ICD-10-CM | POA: Diagnosis not present

## 2013-03-26 DIAGNOSIS — Z7901 Long term (current) use of anticoagulants: Secondary | ICD-10-CM | POA: Diagnosis not present

## 2013-03-26 DIAGNOSIS — I2699 Other pulmonary embolism without acute cor pulmonale: Secondary | ICD-10-CM | POA: Diagnosis not present

## 2013-04-29 DIAGNOSIS — Z7901 Long term (current) use of anticoagulants: Secondary | ICD-10-CM | POA: Diagnosis not present

## 2013-04-29 DIAGNOSIS — I2699 Other pulmonary embolism without acute cor pulmonale: Secondary | ICD-10-CM | POA: Diagnosis not present

## 2013-05-14 DIAGNOSIS — R82998 Other abnormal findings in urine: Secondary | ICD-10-CM | POA: Diagnosis not present

## 2013-05-14 DIAGNOSIS — R300 Dysuria: Secondary | ICD-10-CM | POA: Diagnosis not present

## 2013-06-04 DIAGNOSIS — I10 Essential (primary) hypertension: Secondary | ICD-10-CM | POA: Diagnosis not present

## 2013-06-04 DIAGNOSIS — Z7901 Long term (current) use of anticoagulants: Secondary | ICD-10-CM | POA: Diagnosis not present

## 2013-06-04 DIAGNOSIS — Z8719 Personal history of other diseases of the digestive system: Secondary | ICD-10-CM | POA: Diagnosis not present

## 2013-06-04 DIAGNOSIS — I2699 Other pulmonary embolism without acute cor pulmonale: Secondary | ICD-10-CM | POA: Diagnosis not present

## 2013-06-04 DIAGNOSIS — M1990 Unspecified osteoarthritis, unspecified site: Secondary | ICD-10-CM | POA: Diagnosis not present

## 2013-06-04 DIAGNOSIS — Z1331 Encounter for screening for depression: Secondary | ICD-10-CM | POA: Diagnosis not present

## 2013-06-04 DIAGNOSIS — M109 Gout, unspecified: Secondary | ICD-10-CM | POA: Diagnosis not present

## 2013-06-10 DIAGNOSIS — L738 Other specified follicular disorders: Secondary | ICD-10-CM | POA: Diagnosis not present

## 2013-06-10 DIAGNOSIS — L821 Other seborrheic keratosis: Secondary | ICD-10-CM | POA: Diagnosis not present

## 2013-06-10 DIAGNOSIS — L570 Actinic keratosis: Secondary | ICD-10-CM | POA: Diagnosis not present

## 2013-06-10 DIAGNOSIS — L719 Rosacea, unspecified: Secondary | ICD-10-CM | POA: Diagnosis not present

## 2013-06-30 DIAGNOSIS — Z23 Encounter for immunization: Secondary | ICD-10-CM | POA: Diagnosis not present

## 2013-06-30 DIAGNOSIS — I2699 Other pulmonary embolism without acute cor pulmonale: Secondary | ICD-10-CM | POA: Diagnosis not present

## 2013-06-30 DIAGNOSIS — Z7901 Long term (current) use of anticoagulants: Secondary | ICD-10-CM | POA: Diagnosis not present

## 2013-07-30 DIAGNOSIS — I2699 Other pulmonary embolism without acute cor pulmonale: Secondary | ICD-10-CM | POA: Diagnosis not present

## 2013-07-30 DIAGNOSIS — Z7901 Long term (current) use of anticoagulants: Secondary | ICD-10-CM | POA: Diagnosis not present

## 2013-07-31 DIAGNOSIS — Z7901 Long term (current) use of anticoagulants: Secondary | ICD-10-CM | POA: Diagnosis not present

## 2013-07-31 DIAGNOSIS — R809 Proteinuria, unspecified: Secondary | ICD-10-CM | POA: Diagnosis not present

## 2013-07-31 DIAGNOSIS — N390 Urinary tract infection, site not specified: Secondary | ICD-10-CM | POA: Diagnosis not present

## 2013-07-31 DIAGNOSIS — R300 Dysuria: Secondary | ICD-10-CM | POA: Diagnosis not present

## 2013-08-13 DIAGNOSIS — L708 Other acne: Secondary | ICD-10-CM | POA: Diagnosis not present

## 2013-08-13 DIAGNOSIS — L570 Actinic keratosis: Secondary | ICD-10-CM | POA: Diagnosis not present

## 2013-08-26 DIAGNOSIS — Z7901 Long term (current) use of anticoagulants: Secondary | ICD-10-CM | POA: Diagnosis not present

## 2013-08-26 DIAGNOSIS — I2699 Other pulmonary embolism without acute cor pulmonale: Secondary | ICD-10-CM | POA: Diagnosis not present

## 2013-09-22 DIAGNOSIS — L719 Rosacea, unspecified: Secondary | ICD-10-CM | POA: Diagnosis not present

## 2013-09-22 DIAGNOSIS — L708 Other acne: Secondary | ICD-10-CM | POA: Diagnosis not present

## 2013-09-24 DIAGNOSIS — Z7901 Long term (current) use of anticoagulants: Secondary | ICD-10-CM | POA: Diagnosis not present

## 2013-09-24 DIAGNOSIS — I2699 Other pulmonary embolism without acute cor pulmonale: Secondary | ICD-10-CM | POA: Diagnosis not present

## 2013-10-28 DIAGNOSIS — I2699 Other pulmonary embolism without acute cor pulmonale: Secondary | ICD-10-CM | POA: Diagnosis not present

## 2013-10-28 DIAGNOSIS — Z7901 Long term (current) use of anticoagulants: Secondary | ICD-10-CM | POA: Diagnosis not present

## 2013-11-10 DIAGNOSIS — L708 Other acne: Secondary | ICD-10-CM | POA: Diagnosis not present

## 2013-11-10 DIAGNOSIS — L738 Other specified follicular disorders: Secondary | ICD-10-CM | POA: Diagnosis not present

## 2013-11-14 ENCOUNTER — Encounter (HOSPITAL_COMMUNITY): Payer: Self-pay | Admitting: Emergency Medicine

## 2013-11-14 ENCOUNTER — Inpatient Hospital Stay (HOSPITAL_COMMUNITY)
Admission: EM | Admit: 2013-11-14 | Discharge: 2013-11-18 | DRG: 378 | Disposition: A | Discharge status: Home / Self Care | Payer: Medicare Other | Attending: Internal Medicine | Admitting: Internal Medicine

## 2013-11-14 DIAGNOSIS — Z9049 Acquired absence of other specified parts of digestive tract: Secondary | ICD-10-CM | POA: Diagnosis not present

## 2013-11-14 DIAGNOSIS — I129 Hypertensive chronic kidney disease with stage 1 through stage 4 chronic kidney disease, or unspecified chronic kidney disease: Secondary | ICD-10-CM | POA: Diagnosis present

## 2013-11-14 DIAGNOSIS — D649 Anemia, unspecified: Secondary | ICD-10-CM | POA: Diagnosis not present

## 2013-11-14 DIAGNOSIS — K921 Melena: Secondary | ICD-10-CM | POA: Diagnosis not present

## 2013-11-14 DIAGNOSIS — E876 Hypokalemia: Secondary | ICD-10-CM | POA: Diagnosis not present

## 2013-11-14 DIAGNOSIS — Z8546 Personal history of malignant neoplasm of prostate: Secondary | ICD-10-CM | POA: Diagnosis not present

## 2013-11-14 DIAGNOSIS — D62 Acute posthemorrhagic anemia: Secondary | ICD-10-CM | POA: Diagnosis present

## 2013-11-14 DIAGNOSIS — K6289 Other specified diseases of anus and rectum: Secondary | ICD-10-CM | POA: Diagnosis present

## 2013-11-14 DIAGNOSIS — Z7901 Long term (current) use of anticoagulants: Secondary | ICD-10-CM

## 2013-11-14 DIAGNOSIS — K5731 Diverticulosis of large intestine without perforation or abscess with bleeding: Principal | ICD-10-CM | POA: Diagnosis present

## 2013-11-14 DIAGNOSIS — N183 Chronic kidney disease, stage 3 unspecified: Secondary | ICD-10-CM | POA: Diagnosis present

## 2013-11-14 DIAGNOSIS — K5520 Angiodysplasia of colon without hemorrhage: Secondary | ICD-10-CM | POA: Diagnosis present

## 2013-11-14 DIAGNOSIS — Z9079 Acquired absence of other genital organ(s): Secondary | ICD-10-CM | POA: Diagnosis not present

## 2013-11-14 DIAGNOSIS — K922 Gastrointestinal hemorrhage, unspecified: Secondary | ICD-10-CM | POA: Diagnosis not present

## 2013-11-14 DIAGNOSIS — Z79899 Other long term (current) drug therapy: Secondary | ICD-10-CM | POA: Diagnosis not present

## 2013-11-14 DIAGNOSIS — Y842 Radiological procedure and radiotherapy as the cause of abnormal reaction of the patient, or of later complication, without mention of misadventure at the time of the procedure: Secondary | ICD-10-CM | POA: Diagnosis present

## 2013-11-14 DIAGNOSIS — I2782 Chronic pulmonary embolism: Secondary | ICD-10-CM | POA: Diagnosis present

## 2013-11-14 DIAGNOSIS — D126 Benign neoplasm of colon, unspecified: Secondary | ICD-10-CM | POA: Diagnosis not present

## 2013-11-14 DIAGNOSIS — D500 Iron deficiency anemia secondary to blood loss (chronic): Secondary | ICD-10-CM | POA: Diagnosis not present

## 2013-11-14 DIAGNOSIS — M109 Gout, unspecified: Secondary | ICD-10-CM | POA: Diagnosis present

## 2013-11-14 DIAGNOSIS — K5730 Diverticulosis of large intestine without perforation or abscess without bleeding: Secondary | ICD-10-CM | POA: Diagnosis present

## 2013-11-14 DIAGNOSIS — C189 Malignant neoplasm of colon, unspecified: Secondary | ICD-10-CM | POA: Diagnosis not present

## 2013-11-14 DIAGNOSIS — Z86711 Personal history of pulmonary embolism: Secondary | ICD-10-CM | POA: Diagnosis present

## 2013-11-14 DIAGNOSIS — I10 Essential (primary) hypertension: Secondary | ICD-10-CM | POA: Diagnosis not present

## 2013-11-14 DIAGNOSIS — L719 Rosacea, unspecified: Secondary | ICD-10-CM | POA: Diagnosis present

## 2013-11-14 LAB — COMPREHENSIVE METABOLIC PANEL
ALT: 16 U/L (ref 0–53)
AST: 23 U/L (ref 0–37)
Albumin: 3.5 g/dL (ref 3.5–5.2)
Alkaline Phosphatase: 86 U/L (ref 39–117)
BILIRUBIN TOTAL: 0.3 mg/dL (ref 0.3–1.2)
BUN: 34 mg/dL — ABNORMAL HIGH (ref 6–23)
CHLORIDE: 105 meq/L (ref 96–112)
CO2: 26 meq/L (ref 19–32)
CREATININE: 1.65 mg/dL — AB (ref 0.50–1.35)
Calcium: 9.4 mg/dL (ref 8.4–10.5)
GFR, EST AFRICAN AMERICAN: 44 mL/min — AB (ref >90)
GFR, EST NON AFRICAN AMERICAN: 38 mL/min — AB (ref >90)
GLUCOSE: 102 mg/dL — AB (ref 70–99)
Potassium: 4.1 mEq/L (ref 3.7–5.3)
Sodium: 141 mEq/L (ref 137–147)
Total Protein: 6.8 g/dL (ref 6.0–8.3)

## 2013-11-14 LAB — PROTIME-INR
INR: 2.2 — ABNORMAL HIGH (ref 0.00–1.49)
Prothrombin Time: 23.7 seconds — ABNORMAL HIGH (ref 11.6–15.2)

## 2013-11-14 LAB — CBC
HCT: 35.1 % — ABNORMAL LOW (ref 39.0–52.0)
Hemoglobin: 11.5 g/dL — ABNORMAL LOW (ref 13.0–17.0)
MCH: 30.5 pg (ref 26.0–34.0)
MCHC: 32.8 g/dL (ref 30.0–36.0)
MCV: 93.1 fL (ref 78.0–100.0)
PLATELETS: 188 10*3/uL (ref 150–400)
RBC: 3.77 MIL/uL — AB (ref 4.22–5.81)
RDW: 15.7 % — ABNORMAL HIGH (ref 11.5–15.5)
WBC: 6.9 10*3/uL (ref 4.0–10.5)

## 2013-11-14 LAB — OCCULT BLOOD, POC DEVICE: Fecal Occult Bld: POSITIVE — AB

## 2013-11-14 MED ORDER — ONDANSETRON HCL 4 MG/2ML IJ SOLN: 4.0000 mg | Freq: Four times a day (QID) | INTRAMUSCULAR | Status: DC | PRN

## 2013-11-14 MED ORDER — ONDANSETRON HCL 4 MG PO TABS: 4.0000 mg | ORAL_TABLET | Freq: Four times a day (QID) | ORAL | Status: DC | PRN

## 2013-11-14 MED ORDER — ACETAMINOPHEN 325 MG PO TABS: 650.0000 mg | ORAL_TABLET | Freq: Four times a day (QID) | ORAL | Status: DC | PRN

## 2013-11-14 MED ORDER — SODIUM CHLORIDE 0.9 % IV SOLN
INTRAVENOUS | Status: DC
  Administered 2013-11-15 – 2013-11-18 (×4): via INTRAVENOUS

## 2013-11-14 MED ORDER — ALLOPURINOL 300 MG PO TABS
300.0000 mg | ORAL_TABLET | Freq: Every day | ORAL | Status: DC
  Administered 2013-11-15 – 2013-11-17 (×3): 300 mg via ORAL
  Filled 2013-11-14 (×4): qty 1

## 2013-11-14 MED ORDER — ACETAMINOPHEN 650 MG RE SUPP: 650.0000 mg | Freq: Four times a day (QID) | RECTAL | Status: DC | PRN

## 2013-11-14 MED ORDER — ADULT MULTIVITAMIN W/MINERALS CH
1.0000 | ORAL_TABLET | Freq: Every day | ORAL | Status: DC
  Administered 2013-11-15 – 2013-11-17 (×3): 1 via ORAL
  Filled 2013-11-14 (×4): qty 1

## 2013-11-14 MED ORDER — CLINDAMYCIN PHOSPHATE 1 % EX LOTN: 1.0000 "application " | TOPICAL_LOTION | Freq: Two times a day (BID) | CUTANEOUS | Status: DC | PRN

## 2013-11-14 NOTE — ED Notes (Signed)
Pt c/o diarrhea onset today with progessive bright red blood in stool. Denies pain, denies n/v. Pt is on coumadin.

## 2013-11-14 NOTE — H&P (Signed)
PCP:   [name] Pao, MD   Chief Complaint:  Blood in stool  HPI: [name] is an 81 year old white male with a history of recurrent diverticulitis status post sigmoid colectomy (1/11), pulmonary embolism on chronic anticoagulation who presented to the emergency department with the complaint of blood in his stool. He states that he was in his usual state of health this morning other than an ingrowing hair in his groin area which she has been applying clindamycin gel to.  Around 10 AM he noted that he had a dark stool with some blood when he wiped. He subsequently had 2 additional bloody stools prompting him to come to the emergency department. Since arrival emergency department he said about 4 additional bright red bloody stools. He denies any abdominal pain or cramping. In the emergency department his INR is 2.2, hemoglobin 11.5. He is hemodynamically stable. He reports that his last colonoscopy was prior to his colectomy.  Review of Systems:  Review of Systems - All systems were reviewed with the patient and are negative as in history of present illness the following exceptions: Ingrown hair in the pubic area Past Medical History: Past Medical History  Diagnosis Date  . Hypertension   . Incontinence   . Diverticulitis    Diverticulitis Dermatitis - rashes, sees [name] Prostate CA s/p prostatectomy 1992, [place].Hernia near old ostomy site. Pulmonary Embolus s/p hernia surgery 2012 Cyst, L shoulder, s/p multiple aspirations, Beane CKD stage 3 (crcl 40)   Past Surgical History  Procedure Laterality Date  . Hernia repair  1999  . Prostate removed  1992  . Colon surgery      Hemorrhoids - 1961, 1971, [place] Prostate removed - 1992, in [place] ileoostomy - 2011, s/p diverticular rupture L bursa shoulder removal 2012   Medications: Prior to Admission medications   Medication Sig Start Date End Date Taking? Authorizing Provider  allopurinol (ZYLOPRIM) 300 MG  tablet Take 300 mg by mouth daily.     Yes Historical Provider, MD  carvedilol (COREG) 6.25 MG tablet Take 6.25 mg by mouth 2 (two) times daily with a meal.     Yes Historical Provider, MD  clindamycin (CLEOCIN T) 1 % lotion Apply 1 application topically 2 (two) times daily as needed (to bumps of groin area).   Yes Historical Provider, MD  hydrochlorothiazide (HYDRODIURIL) 50 MG tablet Take 50 mg by mouth daily.   Yes Historical Provider, MD  mesalamine (CANASA) 1000 MG suppository Place 1,000 mg rectally daily as needed (ulcerative collitis).   Yes Historical Provider, MD  Multiple Vitamins-Minerals (CENTRUM SILVER PO) Take by mouth.     Yes Historical Provider, MD  mupirocin ointment (BACTROBAN) 2 % Apply 1 application topically 2 (two) times daily as needed (bumps on the groin area).   Yes Historical Provider, MD  potassium chloride SA (K-DUR,KLOR-CON) 20 MEQ tablet Take 20 mEq by mouth daily.   Yes Historical Provider, MD  warfarin (COUMADIN) 5 MG tablet Take 2.5-5 mg by mouth daily. Take 1 (5mg )  tablet on Tuesday,thursday, & Saturday Take 0.5 (2.5mg ) on all other days   Yes Historical Provider, MD    Allergies:   Allergies  Allergen Reactions  . Sulfonamide Derivatives     REACTION: unspecified    Social History:  reports that he has never smoked. He does not have any smokeless tobacco history on file. He reports that he does not drink alcohol or use illicit drugs. Married to [employer] Ret[employer]  Family History: Father - deceased old age   prostate cancer, HTN Mother - deceased old age  Stroke, Arthritis  Physical Exam: Filed Vitals:   11/14/13 1930  BP: 111/78  Pulse: 78  Temp: 97.6 F (36.4 C)  Resp: 18  Height: 5\' 10"  (1.778 m)  Weight: 88.451 kg (195 lb)  SpO2: 97%   General appearance: alert and no distress Head: Normocephalic, without obvious abnormality, atraumatic Eyes: conjunctivae/corneas clear. PERRL, EOM's intact.  Nose: Nares  normal. Septum midline. Mucosa normal. No drainage or sinus tenderness. Throat: lips, mucosa, and tongue normal; teeth and gums normal Neck: no adenopathy, no carotid bruit, no JVD and thyroid not enlarged, symmetric, no tenderness/mass/nodules Resp: clear to auscultation bilaterally Cardio: regular rate and rhythm GI: soft, non-tender; bowel sounds normal; no masses,  no organomegaly; multiple abdominal scars Extremities: extremities normal, atraumatic, no cyanosis or edema Pulses: 2+ and symmetric Lymph nodes: no cervical lymphadenopathy Neurologic: Alert and oriented X 3, normal strength and tone. Normal symmetric reflexes.   Labs on Admission:   Recent Labs  11/14/13 1950  NA 141  K 4.1  CL 105  CO2 26  GLUCOSE 102*  BUN 34*  CREATININE 1.65*  CALCIUM 9.4    Recent Labs  11/14/13 1950  AST 23  ALT 16  ALKPHOS 86  BILITOT 0.3  PROT 6.8  ALBUMIN 3.5   No results found for this basename: LIPASE, AMYLASE,  in the last 72 hours  Recent Labs  11/14/13 1950  WBC 6.9  HGB 11.5*  HCT 35.1*  MCV 93.1  PLT 188   No results found for this basename: CKTOTAL, CKMB, CKMBINDEX, TROPONINI,  in the last 72 hours Lab Results  Component Value Date   INR 2.20* 11/14/2013   INR 2.90* 02/12/2011   INR 1.91* 02/02/2011   No results found for this basename: TSH, T4TOTAL, FREET3, T3FREE, THYROIDAB,  in the last 72 hours No results found for this basename: VITAMINB12, FOLATE, FERRITIN, TIBC, IRON, RETICCTPCT,  in the last 72 hours  Radiological Exams on Admission: No results found. Orders placed during the [name] encounter of 01/29/11  . EKG  . EKG  . EKG    Assessment/Plan Principal Problem: 1. Acute lower GI bleeding- his history is consistent with a lower GI bleed in the setting of chronic anticoagulation. Most likely diverticular given his history of diverticulosis and absence of abdominal pain. Will admit for close monitoring of hemodynamics and hemoglobin. We'll  transfuse if hemoglobin drops below 8.0. If he continues to have active bleeding we may need to reverse his INR with vitamin K and/or FFP.  Will hold off for now given his hemodynamic stability. GI has been consulted by the emergency department. Consider colonoscopy once bleeding stabilizes versus tagged red blood cell scan if persistent bleeding.  Active Problems: 2.  History of pulmonary embolism on  Warfarin anticoagulation- he had an isolated PE following inguinal hernia repair in 2012 but has been told he would need lifelong anticoagulation. Since this was an isolated event I feel comfortable discontinuing his anticoagulation in the setting of his acute bleed. I do not feel that an IVC filter is indicated at this point. Will use SCDs to try to prevent additional clots. Defer to primary care physician whether he needs to restart anticoagulation once his bleeding stabilizes. 3. Hypertension- we'll hold his Coreg and HCTZ overnight to make sure that his blood pressure remained stable. Resume if stable to 4. DVT prophylaxis- SCDs 5. Disposition- anticipate discharge to home  in 2-3 days if bleeding stabilizes.  Sydney Azure,W [name] 11/14/2013, 10:16 PM

## 2013-11-14 NOTE — ED Provider Notes (Signed)
CSN: [id number]     Arrival date & time 11/14/13  1848 History   First MD Initiated Contact with Patient 11/14/13 1946     Chief Complaint  Patient presents with  . Rectal Bleeding   (Consider location/radiation/quality/duration/timing/severity/associated sxs/prior Treatment) The history is provided by the patient.  [name] is a 80 y.o. male hx of HTN, diverticulitis here with GI bleed. He was recently placed on clinda cream to pelvic area 2 days ago. Today, had several episodes of diarrhea and now has bright red blood in stool. Denies abdominal pain or vomiting. Denies fevers. He is on coumadin for pulmonary embolus in the past.   Past Medical History  Diagnosis Date  . Hypertension   . Incontinence   . Diverticulitis    Past Surgical History  Procedure Laterality Date  . Hernia repair  1999  . Prostate removed  1992  . Colon surgery     No family history on file. History  Substance Use Topics  . Smoking status: Never Smoker   . Smokeless tobacco: Not on file  . Alcohol Use: No    Review of Systems  Gastrointestinal: Positive for blood in stool and hematochezia.  All other systems reviewed and are negative.    Allergies  Sulfonamide derivatives  Home Medications   Current Outpatient Rx  Name  Route  Sig  Dispense  Refill  . allopurinol (ZYLOPRIM) 300 MG tablet   Oral   Take 300 mg by mouth daily.           . carvedilol (COREG) 6.25 MG tablet   Oral   Take 6.25 mg by mouth 2 (two) times daily with a meal.           . clindamycin (CLEOCIN T) 1 % lotion   Topical   Apply 1 application topically 2 (two) times daily as needed (to bumps of groin area).         . hydrochlorothiazide (HYDRODIURIL) 50 MG tablet   Oral   Take 50 mg by mouth daily.         . mesalamine (CANASA) 1000 MG suppository   Rectal   Place 1,000 mg rectally daily as needed (ulcerative collitis).         . Multiple Vitamins-Minerals (CENTRUM SILVER PO)   Oral   Take by  mouth.           . mupirocin ointment (BACTROBAN) 2 %   Topical   Apply 1 application topically 2 (two) times daily as needed (bumps on the groin area).         . potassium chloride SA (K-DUR,KLOR-CON) 20 MEQ tablet   Oral   Take 20 mEq by mouth daily.         Marland Kitchen warfarin (COUMADIN) 5 MG tablet   Oral   Take 2.5-5 mg by mouth daily. Take 1 (5mg )  tablet on Tuesday,thursday, & Saturday Take 0.5 (2.5mg ) on all other days          BP 111/78  Pulse 78  Temp(Src) 97.6 F (36.4 C)  Resp 18  Ht 5\' 10"  (1.778 m)  Wt 195 lb (88.451 kg)  BMI 27.98 kg/m2  SpO2 97% Physical Exam  Nursing note and vitals reviewed. Constitutional: He is oriented to person, place, and time.  Chronically ill   HENT:  Head: Normocephalic.  Mouth/Throat: Oropharynx is clear and moist.  Eyes: Conjunctivae are normal. Pupils are equal, round, and reactive to light.  Neck: Normal range of motion.  Neck supple.  Cardiovascular: Normal rate, regular rhythm and normal heart sounds.   Pulmonary/Chest: Effort normal and breath sounds normal. No respiratory distress. He has no wheezes. He has no rales.  Abdominal: Soft. Bowel sounds are normal. He exhibits no distension. There is no tenderness. There is no rebound.  Genitourinary:  Rectal- dry blood, no hemorrhoids   Musculoskeletal: Normal range of motion.  Neurological: He is alert and oriented to person, place, and time. No cranial nerve deficit. Coordination normal.  Skin: Skin is warm and dry.  Psychiatric: He has a normal mood and affect. His behavior is normal. Judgment and thought content normal.    ED Course  Procedures (including critical care time) Labs Review Labs Reviewed  COMPREHENSIVE METABOLIC PANEL - Abnormal; Notable for the following:    Glucose, Bld 102 (*)    BUN 34 (*)    Creatinine, Ser 1.65 (*)    GFR calc non Af Amer 38 (*)    GFR calc Af Amer 44 (*)    All other components within normal limits  CBC - Abnormal; Notable for  the following:    RBC 3.77 (*)    Hemoglobin 11.5 (*)    HCT 35.1 (*)    RDW 15.7 (*)    All other components within normal limits  PROTIME-INR - Abnormal; Notable for the following:    Prothrombin Time 23.7 (*)    INR 2.20 (*)    All other components within normal limits  OCCULT BLOOD, POC DEVICE - Abnormal; Notable for the following:    Fecal Occult Bld POSITIVE (*)    All other components within normal limits   Imaging Review No results found.  EKG Interpretation   None       MDM  No diagnosis found. LETICIA MCDIARMID is a 80 y.o. male here with GI bleed. Occ positive. Hg 11.5, slightly lower than baseline. INR 2.2. BUN and Cr slightly elevated compared to baseline. Will admit for GI bleed. I called Dr. Amada Kingfisher, who will consult. Will admit to [facility].     Wandra Arthurs, MD 11/14/13 2209

## 2013-11-15 ENCOUNTER — Encounter (HOSPITAL_COMMUNITY): Payer: Self-pay | Admitting: Gastroenterology

## 2013-11-15 DIAGNOSIS — K921 Melena: Secondary | ICD-10-CM | POA: Diagnosis not present

## 2013-11-15 DIAGNOSIS — I10 Essential (primary) hypertension: Secondary | ICD-10-CM | POA: Diagnosis not present

## 2013-11-15 DIAGNOSIS — D649 Anemia, unspecified: Secondary | ICD-10-CM | POA: Diagnosis not present

## 2013-11-15 DIAGNOSIS — D500 Iron deficiency anemia secondary to blood loss (chronic): Secondary | ICD-10-CM | POA: Diagnosis not present

## 2013-11-15 DIAGNOSIS — K922 Gastrointestinal hemorrhage, unspecified: Secondary | ICD-10-CM | POA: Diagnosis not present

## 2013-11-15 LAB — BASIC METABOLIC PANEL
BUN: 33 mg/dL — ABNORMAL HIGH (ref 6–23)
CHLORIDE: 106 meq/L (ref 96–112)
CO2: 24 meq/L (ref 19–32)
Calcium: 8.6 mg/dL (ref 8.4–10.5)
Creatinine, Ser: 1.65 mg/dL — ABNORMAL HIGH (ref 0.50–1.35)
GFR calc Af Amer: 44 mL/min — ABNORMAL LOW (ref >90)
GFR calc non Af Amer: 38 mL/min — ABNORMAL LOW (ref >90)
Glucose, Bld: 114 mg/dL — ABNORMAL HIGH (ref 70–99)
Potassium: 3.8 mEq/L (ref 3.7–5.3)
SODIUM: 141 meq/L (ref 137–147)

## 2013-11-15 LAB — HEMOGLOBIN: HEMOGLOBIN: 9.7 g/dL — AB (ref 13.0–17.0)

## 2013-11-15 LAB — PROTIME-INR
INR: 2.58 — AB (ref 0.00–1.49)
Prothrombin Time: 26.8 seconds — ABNORMAL HIGH (ref 11.6–15.2)

## 2013-11-15 LAB — CBC
HEMATOCRIT: 29 % — AB (ref 39.0–52.0)
Hemoglobin: 9.6 g/dL — ABNORMAL LOW (ref 13.0–17.0)
MCH: 31.1 pg (ref 26.0–34.0)
MCHC: 33.1 g/dL (ref 30.0–36.0)
MCV: 93.9 fL (ref 78.0–100.0)
Platelets: 161 10*3/uL (ref 150–400)
RBC: 3.09 MIL/uL — AB (ref 4.22–5.81)
RDW: 15.7 % — ABNORMAL HIGH (ref 11.5–15.5)
WBC: 9.8 10*3/uL (ref 4.0–10.5)

## 2013-11-15 LAB — ABO/RH: ABO/RH(D): O POS

## 2013-11-15 LAB — MRSA PCR SCREENING: MRSA BY PCR: NEGATIVE

## 2013-11-15 LAB — HEMOGLOBIN AND HEMATOCRIT, BLOOD
HEMATOCRIT: 23.7 % — AB (ref 39.0–52.0)
Hemoglobin: 8 g/dL — ABNORMAL LOW (ref 13.0–17.0)

## 2013-11-15 LAB — PREPARE RBC (CROSSMATCH)

## 2013-11-15 MED ORDER — DIPHENHYDRAMINE HCL 25 MG PO CAPS
25.0000 mg | ORAL_CAPSULE | Freq: Once | ORAL | Status: AC
  Administered 2013-11-15: 25 mg via ORAL
  Filled 2013-11-15: qty 1

## 2013-11-15 MED ORDER — DEXTROSE 5 % IV SOLN
5.0000 mg | Freq: Once | INTRAVENOUS | Status: AC
  Administered 2013-11-15: 5 mg via INTRAVENOUS
  Filled 2013-11-15: qty 0.5

## 2013-11-15 MED ORDER — FUROSEMIDE 10 MG/ML IJ SOLN
20.0000 mg | Freq: Once | INTRAMUSCULAR | Status: AC
  Administered 2013-11-16: 20 mg via INTRAVENOUS
  Filled 2013-11-15: qty 2

## 2013-11-15 MED ORDER — ACETAMINOPHEN 325 MG PO TABS
650.0000 mg | ORAL_TABLET | Freq: Once | ORAL | Status: AC
  Administered 2013-11-15: 650 mg via ORAL
  Filled 2013-11-15: qty 2

## 2013-11-15 NOTE — Progress Notes (Signed)
Pt reports another large bloody stool. Serial hgb back at 8.0. MD notified and new orders to be placed by MD.

## 2013-11-15 NOTE — Progress Notes (Addendum)
Subjective: No complaints- last BM around 2:30 am.  No further bleeding since then.  No abdominal pain.  Objective: Vital signs in last 24 hours: Temp:  [97.6 F (36.4 C)-97.9 F (36.6 C)] 97.8 F (36.6 C) (01/24 0422) Pulse Rate:  [68-78] 73 (01/24 0422) Resp:  [16-18] 16 (01/24 0422) BP: (94-111)/(56-78) 102/64 mmHg (01/24 0500) SpO2:  [97 %-100 %] 100 % (01/24 0422) Weight:  [86.8 kg (191 lb 5.8 oz)-88.451 kg (195 lb)] 86.8 kg (191 lb 5.8 oz) (01/24 0005) Weight change:  Last BM Date: 11/15/13  CBG (last 3)  No results found for this basename: GLUCAP,  in the last 72 hours  Intake/Output from previous day: 01/23 0701 - 01/24 0700 In: 400 [P.O.:400] Out: -  Intake/Output this shift:    General appearance: alert and no distress Eyes: no scleral icterus Throat: oropharynx moist without erythema Resp: clear to auscultation bilaterally Cardio: regular rate and rhythm GI: soft, non-tender; bowel sounds normal; no masses,  no organomegaly Extremities: no clubbing, cyanosis or edema   Lab Results:  Recent Labs  11/14/13 1950 11/15/13 0412  NA 141 141  K 4.1 3.8  CL 105 106  CO2 26 24  GLUCOSE 102* 114*  BUN 34* 33*  CREATININE 1.65* 1.65*  CALCIUM 9.4 8.6    Recent Labs  11/14/13 1950  AST 23  ALT 16  ALKPHOS 86  BILITOT 0.3  PROT 6.8  ALBUMIN 3.5    Recent Labs  11/14/13 1950 11/15/13 0115 11/15/13 0412  WBC 6.9  --  9.8  HGB 11.5* 9.7* 9.6*  HCT 35.1*  --  29.0*  MCV 93.1  --  93.9  PLT 188  --  161   Lab Results  Component Value Date   INR 2.58* 11/15/2013   INR 2.20* 11/14/2013   INR 2.90* 02/12/2011   No results found for this basename: CKTOTAL, CKMB, CKMBINDEX, TROPONINI,  in the last 72 hours No results found for this basename: TSH, T4TOTAL, FREET3, T3FREE, THYROIDAB,  in the last 72 hours No results found for this basename: VITAMINB12, FOLATE, FERRITIN, TIBC, IRON, RETICCTPCT,  in the last 72 hours  Studies/Results: No results  found.   Medications: Scheduled: . allopurinol  300 mg Oral Daily  . multivitamin with minerals  1 tablet Oral Daily   Continuous: . sodium chloride 75 mL/hr at 11/15/13 1829    Assessment/Plan: Principal Problem: 1. Acute lower GI bleeding- discussed with Dr. Watt Climes- history of colonic AVM, radiation proctitis which along with diverticular disease are the most likely causes of lower GI bleed in the setting of anticoagulation.  Appears to have stopped. Anticipate colonoscopy on Monday to determine cause.  Active Problems: 2. Acute Blood Loss Anemia secondary to #1-  Hg dropped from 11.5-->9.5 but stable overnight.  Repeat CBC at 4:00pm and consider transfusion if Hg below 8.0.   3. History of pulmonary embolism on warfarin anticoagulation- INR trending up despite holding Coumadin.  Will hold off on FFP or Vitamin K unless he starts bleeding significantly. Defer to PCP whether he needs to resume Coumadin once bleeding resolves or whether he can discontinue since he had an isolated DVT/PE after surgery (2012). 4. Disposition- anticipate discharge after colonoscopy on Monday if Hg stable and no further bleeding.    LOS: 1 day   Roshard Rezabek,W [name] 11/15/2013, 12:20 PM   RN called that patient had another large bloody stool and Hg is now 8.0.  Will give FFP, Vitamin K and 1 unit PRBC.  Check post-transfusion INR and CBC.

## 2013-11-15 NOTE — Consult Note (Signed)
Reason for Consult: GI bleed Referring Physician: ER physician  [name] is an 80 y.o. male.  HPI: Patient well-known to me from years of GI care with last colonoscopy in 2010 showing left-sided diverticuli radiation proctitis and some cecal AVMs as well and his complicated surgical course with his diverticuli was discussed and his DVT and questionable pulmonary embolus was discussed with both the patient and the medical team and his been on Coumadin but no extra aspirin and rarely uses some Advil p.m. which we discussed and he has not had any bowel complaints and still the bleeding started yesterday but seems to have stopped as of this morning and he feels good currently and he has no upper tract symptoms  Past Medical History  Diagnosis Date  . Hypertension   . Incontinence   . Diverticulitis     Past Surgical History  Procedure Laterality Date  . Hernia repair  1999  . Prostate removed  1992  . Colon surgery      History reviewed. No pertinent family history.  Social History:  reports that he has never smoked. He does not have any smokeless tobacco history on file. He reports that he does not drink alcohol or use illicit drugs.  Allergies:  Allergies  Allergen Reactions  . Sulfonamide Derivatives     REACTION: unspecified    Medications: I have reviewed the patient's current medications.  Results for orders placed during the hospital encounter of 11/14/13 (from the past 48 hour(s))  COMPREHENSIVE METABOLIC PANEL     Status: Abnormal   Collection Time    11/14/13  7:50 PM      Result Value Range   Sodium 141  137 - 147 mEq/L   Potassium 4.1  3.7 - 5.3 mEq/L   Chloride 105  96 - 112 mEq/L   CO2 26  19 - 32 mEq/L   Glucose, Bld 102 (*) 70 - 99 mg/dL   BUN 34 (*) 6 - 23 mg/dL   Creatinine, Ser 1.65 (*) 0.50 - 1.35 mg/dL   Calcium 9.4  8.4 - 10.5 mg/dL   Total Protein 6.8  6.0 - 8.3 g/dL   Albumin 3.5  3.5 - 5.2 g/dL   AST 23  0 - 37 U/L   ALT 16  0 - 53 U/L   Alkaline Phosphatase 86  39 - 117 U/L   Total Bilirubin 0.3  0.3 - 1.2 mg/dL   GFR calc non Af Amer 38 (*) >90 mL/min   GFR calc Af Amer 44 (*) >90 mL/min   Comment: (NOTE)     The eGFR has been calculated using the CKD EPI equation.     This calculation has not been validated in all clinical situations.     eGFR's persistently <90 mL/min signify possible Chronic Kidney     Disease.  CBC     Status: Abnormal   Collection Time    11/14/13  7:50 PM      Result Value Range   WBC 6.9  4.0 - 10.5 K/uL   RBC 3.77 (*) 4.22 - 5.81 MIL/uL   Hemoglobin 11.5 (*) 13.0 - 17.0 g/dL   HCT 35.1 (*) 39.0 - 52.0 %   MCV 93.1  78.0 - 100.0 fL   MCH 30.5  26.0 - 34.0 pg   MCHC 32.8  30.0 - 36.0 g/dL   RDW 15.7 (*) 11.5 - 15.5 %   Platelets 188  150 - 400 K/uL  PROTIME-INR  Status: Abnormal   Collection Time    11/14/13  7:50 PM      Result Value Range   Prothrombin Time 23.7 (*) 11.6 - 15.2 seconds   INR 2.20 (*) 0.00 - 1.49  OCCULT BLOOD, POC DEVICE     Status: Abnormal   Collection Time    11/14/13  8:26 PM      Result Value Range   Fecal Occult Bld POSITIVE (*) NEGATIVE  MRSA PCR SCREENING     Status: None   Collection Time    11/15/13 12:51 AM      Result Value Range   MRSA by PCR NEGATIVE  NEGATIVE   Comment:            The GeneXpert MRSA Assay (FDA     approved for NASAL specimens     only), is one component of a     comprehensive MRSA colonization     surveillance program. It is not     intended to diagnose MRSA     infection nor to guide or     monitor treatment for     MRSA infections.  HEMOGLOBIN     Status: Abnormal   Collection Time    11/15/13  1:15 AM      Result Value Range   Hemoglobin 9.7 (*) 13.0 - 17.0 g/dL  BASIC METABOLIC PANEL     Status: Abnormal   Collection Time    11/15/13  4:12 AM      Result Value Range   Sodium 141  137 - 147 mEq/L   Potassium 3.8  3.7 - 5.3 mEq/L   Chloride 106  96 - 112 mEq/L   CO2 24  19 - 32 mEq/L   Glucose, Bld 114 (*) 70  - 99 mg/dL   BUN 33 (*) 6 - 23 mg/dL   Creatinine, Ser 1.65 (*) 0.50 - 1.35 mg/dL   Calcium 8.6  8.4 - 10.5 mg/dL   GFR calc non Af Amer 38 (*) >90 mL/min   GFR calc Af Amer 44 (*) >90 mL/min   Comment: (NOTE)     The eGFR has been calculated using the CKD EPI equation.     This calculation has not been validated in all clinical situations.     eGFR's persistently <90 mL/min signify possible Chronic Kidney     Disease.  CBC     Status: Abnormal   Collection Time    11/15/13  4:12 AM      Result Value Range   WBC 9.8  4.0 - 10.5 K/uL   RBC 3.09 (*) 4.22 - 5.81 MIL/uL   Hemoglobin 9.6 (*) 13.0 - 17.0 g/dL   HCT 29.0 (*) 39.0 - 52.0 %   MCV 93.9  78.0 - 100.0 fL   MCH 31.1  26.0 - 34.0 pg   MCHC 33.1  30.0 - 36.0 g/dL   RDW 15.7 (*) 11.5 - 15.5 %   Platelets 161  150 - 400 K/uL  PROTIME-INR     Status: Abnormal   Collection Time    11/15/13  4:12 AM      Result Value Range   Prothrombin Time 26.8 (*) 11.6 - 15.2 seconds   INR 2.58 (*) 0.00 - 1.49    No results found.  ROS negative except above Blood pressure 102/64, pulse 73, temperature 97.8 F (36.6 C), temperature source Oral, resp. rate 16, height '5\' 10"'  (1.778 m), weight 86.8 kg (191 lb 5.8 oz), SpO2 100.00%.  Physical Exam vital signs stable afebrile no acute distress abdomen is soft nontender labs slight decrease in hemoglobin BUN and creatinine stable INR a little increased  Assessment/Plan: Multiple medical problems including lower GI bleeding on Coumadin Plan: The medical team will evaluate long-term blood thinners needs and in the meantime we'll probably proceed with colonoscopy on Monday unless needed sooner when necessary and will keep him on clear liquids through the weekend to assist with the prep and will check on tomorrow and if bleeding picks up will need FFP  [name] E 11/15/2013, 12:32 PM

## 2013-11-16 DIAGNOSIS — I10 Essential (primary) hypertension: Secondary | ICD-10-CM | POA: Diagnosis not present

## 2013-11-16 DIAGNOSIS — K922 Gastrointestinal hemorrhage, unspecified: Secondary | ICD-10-CM | POA: Diagnosis not present

## 2013-11-16 DIAGNOSIS — D649 Anemia, unspecified: Secondary | ICD-10-CM | POA: Diagnosis not present

## 2013-11-16 DIAGNOSIS — K921 Melena: Secondary | ICD-10-CM | POA: Diagnosis not present

## 2013-11-16 DIAGNOSIS — D500 Iron deficiency anemia secondary to blood loss (chronic): Secondary | ICD-10-CM | POA: Diagnosis not present

## 2013-11-16 LAB — HEMOGLOBIN AND HEMATOCRIT, BLOOD
HEMATOCRIT: 23.5 % — AB (ref 39.0–52.0)
Hemoglobin: 8.1 g/dL — ABNORMAL LOW (ref 13.0–17.0)

## 2013-11-16 LAB — PROTIME-INR
INR: 1.27 (ref 0.00–1.49)
Prothrombin Time: 15.6 seconds — ABNORMAL HIGH (ref 11.6–15.2)

## 2013-11-16 LAB — BASIC METABOLIC PANEL
BUN: 28 mg/dL — ABNORMAL HIGH (ref 6–23)
CALCIUM: 8.5 mg/dL (ref 8.4–10.5)
CHLORIDE: 101 meq/L (ref 96–112)
CO2: 25 meq/L (ref 19–32)
Creatinine, Ser: 1.57 mg/dL — ABNORMAL HIGH (ref 0.50–1.35)
GFR calc Af Amer: 46 mL/min — ABNORMAL LOW (ref >90)
GFR calc non Af Amer: 40 mL/min — ABNORMAL LOW (ref >90)
Glucose, Bld: 100 mg/dL — ABNORMAL HIGH (ref 70–99)
Potassium: 3.2 mEq/L — ABNORMAL LOW (ref 3.7–5.3)
Sodium: 138 mEq/L (ref 137–147)

## 2013-11-16 LAB — CBC
HEMATOCRIT: 23.9 % — AB (ref 39.0–52.0)
Hemoglobin: 8.2 g/dL — ABNORMAL LOW (ref 13.0–17.0)
MCH: 31.8 pg (ref 26.0–34.0)
MCHC: 34.3 g/dL (ref 30.0–36.0)
MCV: 92.6 fL (ref 78.0–100.0)
PLATELETS: 141 10*3/uL — AB (ref 150–400)
RBC: 2.58 MIL/uL — ABNORMAL LOW (ref 4.22–5.81)
RDW: 15.1 % (ref 11.5–15.5)
WBC: 8.5 10*3/uL (ref 4.0–10.5)

## 2013-11-16 LAB — APTT: aPTT: 31 seconds (ref 24–37)

## 2013-11-16 MED ORDER — SODIUM CHLORIDE 0.9 % IV SOLN
INTRAVENOUS | Status: DC
  Administered 2013-11-16: 12:00:00 via INTRAVENOUS

## 2013-11-16 MED ORDER — POTASSIUM CHLORIDE CRYS ER 20 MEQ PO TBCR
40.0000 meq | EXTENDED_RELEASE_TABLET | Freq: Once | ORAL | Status: AC
  Administered 2013-11-16: 40 meq via ORAL
  Filled 2013-11-16: qty 2

## 2013-11-16 MED ORDER — POLYETHYLENE GLYCOL 3350 17 GM/SCOOP PO POWD
1.0000 | Freq: Once | ORAL | Status: AC
  Administered 2013-11-16: 1 via ORAL
  Filled 2013-11-16: qty 255

## 2013-11-16 NOTE — Progress Notes (Signed)
Ephriam Turman Deems 11:53 AM  Subjective: Patient without any new complaints in good spirits status post transfusion of both packed red cells and FFP with a little bright red blood earlier today  Objective: Vital signs stable afebrile no acute distress abdomen is soft nontender hemoglobin and labs stable  Assessment: Lower GI bleeding  Plan: The risks benefits methods of colonoscopy including possibly using our laser for  AVMs or radiation was discussed with he and his wife and will proceed tomorrow at 9 AM  [facility]

## 2013-11-16 NOTE — Progress Notes (Signed)
Subjective: Tolerated 1 unit PRBC and 2 units FFP yesterday.  Had another episode of BRBPR ~15 minutes ago.  No abdominal pain.    Objective: Vital signs in last 24 hours: Temp:  [97.5 F (36.4 C)-98.7 F (37.1 C)] 97.8 F (36.6 C) (01/25 0540) Pulse Rate:  [67-109] 92 (01/25 0540) Resp:  [16-20] 18 (01/25 0540) BP: (93-125)/(56-82) 118/81 mmHg (01/25 0540) SpO2:  [95 %-100 %] 100 % (01/25 0540) Weight change:  Last BM Date: 11/16/13  CBG (last 3)  No results found for this basename: GLUCAP,  in the last 72 hours  Intake/Output from previous day: 01/24 0701 - 01/25 0700 In: 4137.5 [P.O.:1180; I.V.:1678.8; Blood:1278.8] Out: 700 [Urine:700] Intake/Output this shift:    General appearance: alert and no distress Eyes: no scleral icterus Throat: oropharynx moist without erythema Resp: clear to auscultation bilaterally Cardio: regular rate and rhythm GI: soft, bowel sounds normal; no masses,  no organomegaly; minimal right lower quadrant tenderness Extremities: no clubbing, cyanosis or edema; SCDs in place   Lab Results:  Recent Labs  11/15/13 0412 11/16/13 0745  NA 141 138  K 3.8 3.2*  CL 106 101  CO2 24 25  GLUCOSE 114* 100*  BUN 33* 28*  CREATININE 1.65* 1.57*  CALCIUM 8.6 8.5    Recent Labs  11/14/13 1950  AST 23  ALT 16  ALKPHOS 86  BILITOT 0.3  PROT 6.8  ALBUMIN 3.5    Recent Labs  11/15/13 0412 11/15/13 1635 11/16/13 0745  WBC 9.8  --  8.5  HGB 9.6* 8.0* 8.2*  HCT 29.0* 23.7* 23.9*  MCV 93.9  --  92.6  PLT 161  --  141*   Lab Results  Component Value Date   INR 1.27 11/16/2013   INR 2.58* 11/15/2013   INR 2.20* 11/14/2013    Studies/Results: No results found.   Medications: Scheduled: . allopurinol  300 mg Oral Daily  . multivitamin with minerals  1 tablet Oral Daily  . polyethylene glycol powder  1 Container Oral Once   Continuous: . sodium chloride 75 mL/hr at 11/15/13 1305  . sodium chloride 10 mL/hr at 11/16/13 1201     Assessment/Plan: Principal Problem:  1. Acute lower GI bleeding- ongoing hematochezia.  Plan for colonoscopy tomorrow am to evaluate for ?AVM, radiation proctitis, diverticular source of bleeding.    Active Problems:  2. Acute Blood Loss Anemia secondary to #1- Hg 8.0-->8.2 after 1 unit PRBC.  Will recheck this afternoon and transfuse additional unit if below 8.0.   3. History of pulmonary embolism on warfarin anticoagulation- FFP and Vitamin K give due to ongoing bleeding.  INR 1.27 today. Defer to PCP whether he needs to resume Coumadin once bleeding resolves or whether he can discontinue since he had an isolated DVT/PE after surgery (2012).  4. Disposition- anticipate discharge in 1-2 days after colonoscopy if Hg and bleeding stabilize.   LOS: 2 days   [name],W [name] 11/16/2013, 12:25 PM

## 2013-11-17 ENCOUNTER — Encounter (HOSPITAL_COMMUNITY): Payer: Self-pay

## 2013-11-17 ENCOUNTER — Encounter (HOSPITAL_COMMUNITY)
Admission: EM | Disposition: A | Discharge status: Home / Self Care | Payer: Self-pay | Source: Home / Self Care | Attending: Internal Medicine

## 2013-11-17 DIAGNOSIS — I10 Essential (primary) hypertension: Secondary | ICD-10-CM | POA: Diagnosis not present

## 2013-11-17 DIAGNOSIS — C189 Malignant neoplasm of colon, unspecified: Secondary | ICD-10-CM | POA: Diagnosis not present

## 2013-11-17 DIAGNOSIS — D126 Benign neoplasm of colon, unspecified: Secondary | ICD-10-CM | POA: Diagnosis not present

## 2013-11-17 DIAGNOSIS — D649 Anemia, unspecified: Secondary | ICD-10-CM | POA: Diagnosis not present

## 2013-11-17 DIAGNOSIS — K922 Gastrointestinal hemorrhage, unspecified: Secondary | ICD-10-CM | POA: Diagnosis not present

## 2013-11-17 HISTORY — PX: COLONOSCOPY: SHX5424

## 2013-11-17 HISTORY — PX: HOT HEMOSTASIS: SHX5433

## 2013-11-17 LAB — CBC
HCT: 20.7 % — ABNORMAL LOW (ref 39.0–52.0)
HEMOGLOBIN: 7.1 g/dL — AB (ref 13.0–17.0)
MCH: 32 pg (ref 26.0–34.0)
MCHC: 34.3 g/dL (ref 30.0–36.0)
MCV: 93.2 fL (ref 78.0–100.0)
PLATELETS: 122 10*3/uL — AB (ref 150–400)
RBC: 2.22 MIL/uL — AB (ref 4.22–5.81)
RDW: 15.3 % (ref 11.5–15.5)
WBC: 6.2 10*3/uL (ref 4.0–10.5)

## 2013-11-17 LAB — BASIC METABOLIC PANEL
BUN: 18 mg/dL (ref 6–23)
CO2: 25 meq/L (ref 19–32)
Calcium: 8.2 mg/dL — ABNORMAL LOW (ref 8.4–10.5)
Chloride: 105 mEq/L (ref 96–112)
Creatinine, Ser: 1.41 mg/dL — ABNORMAL HIGH (ref 0.50–1.35)
GFR calc Af Amer: 53 mL/min — ABNORMAL LOW (ref >90)
GFR, EST NON AFRICAN AMERICAN: 45 mL/min — AB (ref >90)
Glucose, Bld: 95 mg/dL (ref 70–99)
POTASSIUM: 3.4 meq/L — AB (ref 3.7–5.3)
SODIUM: 138 meq/L (ref 137–147)

## 2013-11-17 LAB — PREPARE FRESH FROZEN PLASMA
UNIT DIVISION: 0
Unit division: 0

## 2013-11-17 LAB — PREPARE RBC (CROSSMATCH)

## 2013-11-17 LAB — HEMOGLOBIN AND HEMATOCRIT, BLOOD
HEMATOCRIT: 20.7 % — AB (ref 39.0–52.0)
HEMATOCRIT: 26.1 % — AB (ref 39.0–52.0)
HEMOGLOBIN: 7 g/dL — AB (ref 13.0–17.0)
Hemoglobin: 9 g/dL — ABNORMAL LOW (ref 13.0–17.0)

## 2013-11-17 LAB — PROTIME-INR
INR: 1.2 (ref 0.00–1.49)
PROTHROMBIN TIME: 14.9 s (ref 11.6–15.2)

## 2013-11-17 SURGERY — COLONOSCOPY
Anesthesia: Moderate Sedation

## 2013-11-17 MED ORDER — EPINEPHRINE HCL 0.1 MG/ML IJ SOSY
PREFILLED_SYRINGE | INTRAMUSCULAR | Status: AC
  Filled 2013-11-17: qty 10

## 2013-11-17 MED ORDER — MIDAZOLAM HCL 5 MG/5ML IJ SOLN
INTRAMUSCULAR | Status: DC | PRN
  Administered 2013-11-17: 1 mg via INTRAVENOUS
  Administered 2013-11-17: 2 mg via INTRAVENOUS

## 2013-11-17 MED ORDER — FENTANYL CITRATE 0.05 MG/ML IJ SOLN
INTRAMUSCULAR | Status: AC
  Filled 2013-11-17: qty 2

## 2013-11-17 MED ORDER — MIDAZOLAM HCL 10 MG/2ML IJ SOLN
INTRAMUSCULAR | Status: AC
  Filled 2013-11-17: qty 2

## 2013-11-17 MED ORDER — POTASSIUM CHLORIDE CRYS ER 20 MEQ PO TBCR
40.0000 meq | EXTENDED_RELEASE_TABLET | Freq: Once | ORAL | Status: AC
  Administered 2013-11-17: 40 meq via ORAL
  Filled 2013-11-17: qty 2

## 2013-11-17 MED ORDER — FENTANYL CITRATE 0.05 MG/ML IJ SOLN
INTRAMUSCULAR | Status: DC | PRN
  Administered 2013-11-17 (×2): 25 ug via INTRAVENOUS

## 2013-11-17 MED ORDER — SODIUM CHLORIDE 0.9 % IV SOLN
INTRAVENOUS | Status: DC
  Administered 2013-11-17: 09:00:00 via INTRAVENOUS

## 2013-11-17 MED ORDER — DIPHENHYDRAMINE HCL 25 MG PO CAPS
25.0000 mg | ORAL_CAPSULE | Freq: Once | ORAL | Status: AC
  Administered 2013-11-17: 25 mg via ORAL
  Filled 2013-11-17: qty 1

## 2013-11-17 MED ORDER — ACETAMINOPHEN 325 MG PO TABS
650.0000 mg | ORAL_TABLET | Freq: Once | ORAL | Status: AC
  Administered 2013-11-17: 650 mg via ORAL
  Filled 2013-11-17: qty 2

## 2013-11-17 MED ORDER — SODIUM CHLORIDE 0.9 % IJ SOLN
PREFILLED_SYRINGE | INTRAMUSCULAR | Status: DC | PRN
  Administered 2013-11-17: 10:00:00

## 2013-11-17 NOTE — Progress Notes (Signed)
Subjective: Feels ok, post prep, only seeing clear/gray output, Hgb down to 7.1 this AM.  Nurses state no issues with prior transfusions.  Feels ok this AM.  Wife to be here at 52 for his colonoscopy.  Objective: Vital signs in last 24 hours: Temp:  [97.7 F (36.5 C)-97.8 F (36.6 C)] 97.7 F (36.5 C) (01/26 0526) Pulse Rate:  [84-90] 89 (01/26 0526) Resp:  [18-20] 18 (01/26 0526) BP: (106-130)/(65-72) 106/65 mmHg (01/26 0526) SpO2:  [100 %] 100 % (01/26 0526) Weight change:  Last BM Date: 11/16/13  Intake/Output from previous day: 01/25 0701 - 01/26 0700 In: 2755 [P.O.:2170; I.V.:585] Out: -  Intake/Output this shift:   General appearance: alert and no distress  Eyes: no scleral icterus  Throat: oropharynx moist without erythema  Resp: clear to auscultation bilaterally  Cardio: regular rate and rhythm  GI: soft, bowel sounds normal; no masses, no organomegaly; minimal right lower quadrant tenderness  Extremities: no clubbing, cyanosis or edema; SCDs in place  Lab Results:  Recent Labs  11/16/13 0745 11/16/13 1537 11/17/13 0430  WBC 8.5  --  6.2  HGB 8.2* 8.1* 7.1*  HCT 23.9* 23.5* 20.7*  PLT 141*  --  122*   BMET  Recent Labs  11/16/13 0745 11/17/13 0430  NA 138 138  K 3.2* 3.4*  CL 101 105  CO2 25 25  GLUCOSE 100* 95  BUN 28* 18  CREATININE 1.57* 1.41*  CALCIUM 8.5 8.2*    Studies/Results: No results found.  Medications:  I have reviewed the patient's current medications. Scheduled: . acetaminophen  650 mg Oral Once  . allopurinol  300 mg Oral Daily  . diphenhydrAMINE  25 mg Oral Once  . multivitamin with minerals  1 tablet Oral Daily   Continuous: . sodium chloride 75 mL/hr at 11/15/13 1305  . sodium chloride 10 mL/hr at 11/16/13 1201   PZW:CHENIDPOEUMPN, acetaminophen, clindamycin, ondansetron (ZOFRAN) IV, ondansetron  Assessment/Plan: Principal Problem:  1. Acute lower GI bleeding- ongoing hematochezia. Plan for colonoscopythis am to  evaluate for ?AVM, radiation proctitis, diverticular source of bleeding.  History of multiple hemorrhoid issues in the past as well. Active Problems:  2. Acute Blood Loss Anemia secondary to #1- Hg 8.0-->8.2 after 1 unit PRBC. Now 7.1.  Will give 2 units today and recheck post transfusion. 3. History of pulmonary embolism on warfarin anticoagulation- FFP and Vitamin K give due to ongoing bleeding. Will transfuse another 2 units today.4. Disposition- anticipate discharge tomorrow after colonoscopy if Hg and bleeding stabilize. 4. Hypokalemia- Replace orally (3.4)   LOS: 3 days   Corliss Coggeshall W 11/17/2013, 7:26 AM

## 2013-11-17 NOTE — Op Note (Signed)
[facility] [place], [id number]   COLONOSCOPY PROCEDURE REPORT  PATIENT: [name], [name]  MR#: [id number] BIRTHDATE: 12/07/1930 , 82  yrs. old GENDER: Male ENDOSCOPIST: Clarene Essex, MD REFERRED TI:[id number] Tisovec, M.D. PROCEDURE DATE:  11/17/2013 PROCEDURE:   Colonoscopy with biopsy, Colonoscopy with tissue ablation, and Colonoscopy with control of bleeding ASA CLASS:   Class II INDICATIONS:hematochezia. MEDICATIONS: Fentanyl 50 mcg IV and Versed 3 mg IV  DESCRIPTION OF PROCEDURE:   After the risks benefits and alternatives of the procedure were thoroughly explained, informed consent was obtained.  The Pentax Colonoscope [id number]  endoscope was introduced through the anus and advanced to the terminal ileum which was intubated for a short distance , limited by No adverse events experienced.   The quality of the prep was adequate. .  The instrument was then slowly withdrawn as the colon was fully examined.there is no signs of bleeding on insertion and in the cecum was a small nodule with probable visible vessel and we went ahead and took 3 biopsies and because of increased bleeding we went ahead and injected with a total of 3cc of 1-10,000 epinephrineover 2  injections and then used are APC to coagulate the lesion with nice cessation of bleeding and the scope was then slowly withdrawn and left-sided diverticuli were seen both proximal and distal to his sigmoid anastomosis and the radiation proctitis which was minimal was confirmed and after retroflexion was done the scope was reinserted shortly to the left side of the colon air and water were suctioned scope removed patient tolerated the procedure well there was no obvious immediate complication        FINDINGS: 1 small cecal nodule status post biopsy and injection of epinephrine and aPC2. Left-sided diverticuli large and small 3. Sigmoid anastomosis 4. Few diverticuli distal to anastomosis  5. Mild radiation proctitis 6 otherwise within normal limits to the terminal ileum without signs of active bleeding COMPLICATIONS: none  IMPRESSION:  bleeding from diverticuli or cecal nodule  RECOMMENDATIONS: await biopsies slowly advance diet reevaluate Coumadin needs and try to hold off for at least a few daysand will follow with you and happy to see back when necessary in the future   _______________________________ eSigned:  Clarene Essex, MD 11/17/2013 10:11 AM   PJ:[id number] Tisovec, MD  PATIENT NAME:  [name], [name] MR#: [id number]

## 2013-11-17 NOTE — Progress Notes (Signed)
PT Cancellation Note  Patient Details Name: [name] MRN: [id number] DOB: Mar 13, 1931   Cancelled Treatment:    Reason Eval/Treat Not Completed: Medical issues which prohibited therapy Pt with colonoscopy this AM and low HGB, to receive PRBCs.  Will check back tomorrow for evaluation.   Jori Frerichs,KATHrine E 11/17/2013, 3:24 PM Carmelia Bake, PT, DPT 11/17/2013 Pager: [phone]

## 2013-11-18 ENCOUNTER — Encounter (HOSPITAL_COMMUNITY): Payer: Self-pay | Admitting: Gastroenterology

## 2013-11-18 DIAGNOSIS — K921 Melena: Secondary | ICD-10-CM | POA: Diagnosis not present

## 2013-11-18 DIAGNOSIS — D649 Anemia, unspecified: Secondary | ICD-10-CM | POA: Diagnosis not present

## 2013-11-18 DIAGNOSIS — I10 Essential (primary) hypertension: Secondary | ICD-10-CM | POA: Diagnosis not present

## 2013-11-18 DIAGNOSIS — D500 Iron deficiency anemia secondary to blood loss (chronic): Secondary | ICD-10-CM | POA: Diagnosis not present

## 2013-11-18 DIAGNOSIS — K922 Gastrointestinal hemorrhage, unspecified: Secondary | ICD-10-CM | POA: Diagnosis not present

## 2013-11-18 LAB — BASIC METABOLIC PANEL
BUN: 13 mg/dL (ref 6–23)
CO2: 24 mEq/L (ref 19–32)
CREATININE: 1.37 mg/dL — AB (ref 0.50–1.35)
Calcium: 8.6 mg/dL (ref 8.4–10.5)
Chloride: 109 mEq/L (ref 96–112)
GFR calc Af Amer: 55 mL/min — ABNORMAL LOW (ref >90)
GFR calc non Af Amer: 47 mL/min — ABNORMAL LOW (ref >90)
Glucose, Bld: 94 mg/dL (ref 70–99)
Potassium: 3.9 mEq/L (ref 3.7–5.3)
Sodium: 142 mEq/L (ref 137–147)

## 2013-11-18 LAB — CBC
HCT: 27.9 % — ABNORMAL LOW (ref 39.0–52.0)
Hemoglobin: 9.8 g/dL — ABNORMAL LOW (ref 13.0–17.0)
MCH: 31.8 pg (ref 26.0–34.0)
MCHC: 35.1 g/dL (ref 30.0–36.0)
MCV: 90.6 fL (ref 78.0–100.0)
PLATELETS: 148 10*3/uL — AB (ref 150–400)
RBC: 3.08 MIL/uL — ABNORMAL LOW (ref 4.22–5.81)
RDW: 16.3 % — AB (ref 11.5–15.5)
WBC: 7.6 10*3/uL (ref 4.0–10.5)

## 2013-11-18 LAB — TYPE AND SCREEN
ABO/RH(D): O POS
ANTIBODY SCREEN: NEGATIVE
UNIT DIVISION: 0
UNIT DIVISION: 0
Unit division: 0
Unit division: 0

## 2013-11-18 LAB — PROTIME-INR
INR: 0.94 (ref 0.00–1.49)
Prothrombin Time: 12.4 seconds (ref 11.6–15.2)

## 2013-11-18 NOTE — Progress Notes (Signed)
OT Cancellation Note  Patient Details Name: [name] MRN: [id number] DOB: 08/28/1931   Cancelled Treatment:    Reason Eval/Treat Not Completed: OT screened, no needs identified, will sign off. Pt states he has been up in room and hallway on his own. He states no OT or PT needs.  Jules Schick [phone] 11/18/2013, 9:30 AM

## 2013-11-18 NOTE — Progress Notes (Signed)
Patient was stable at time of discharge. Reviewed discharge packet with patient. He verbalized understanding and did not have further questions.

## 2013-11-18 NOTE — Discharge Summary (Signed)
DISCHARGE SUMMARY  [name]  MR#: [id number]  DOB:02-04-1931  Date of Admission: 11/14/2013 Date of Discharge: 11/18/2013  Attending Physician:[name] W  Patient's XYV:[id number] W, MD  Consults:Treatment Team:  [name] Columbia, MD, GI  Discharge Diagnoses: Principal Problem:   Acute lower GI bleeding Active Problems:   DIVERTICULAR DISEASE   Warfarin anticoagulation   History of pulmonary embolism   HTN   Gout   Rosacea  Discharge Medications:   Medication List    STOP taking these medications       mesalamine 1000 MG suppository  Commonly known as:  CANASA     warfarin 5 MG tablet  Commonly known as:  COUMADIN      TAKE these medications       allopurinol 300 MG tablet  Commonly known as:  ZYLOPRIM  Take 300 mg by mouth daily.     carvedilol 6.25 MG tablet  Commonly known as:  COREG  Take 6.25 mg by mouth 2 (two) times daily with a meal.     CENTRUM SILVER PO  Take by mouth.     clindamycin 1 % lotion  Commonly known as:  CLEOCIN T  Apply 1 application topically 2 (two) times daily as needed (to bumps of groin area).     hydrochlorothiazide 50 MG tablet  Commonly known as:  HYDRODIURIL  Take 50 mg by mouth daily.     mupirocin ointment 2 %  Commonly known as:  BACTROBAN  Apply 1 application topically 2 (two) times daily as needed (bumps on the groin area).     potassium chloride SA 20 MEQ tablet  Commonly known as:  K-DUR,KLOR-CON  Take 20 mEq by mouth daily.        Hospital Procedures: No results found.  History of Present Illness: Patient is an 80 year old male who came in with 20 loose BM's with obvious blood  Hospital Course: [name] was admitted from the ER with presumed lower GI bleeding.  His INR at time of admission was appropriate for his anticoagulation goal of 2-3.  He was given FFP and 1 unit of PRBC's initially and had a decrease in his bleeding output, with some resuming over the course of Sunday.  On  Monday, post prep, he underwent colonoscopy with area of bleeding visualized in the right colon and injected with epinephrine in the cecal area and biopsies were obtained, with pathology pending at this time.  He had no further bleeding and completed another 2 units PRBC's post procedure.  He remained hemodynamically stable with no bleeding recurrence and his final hgb prior to discharge was 9.8.  Day of Discharge Exam BP 127/72  Pulse 83  Temp(Src) 97.1 F (36.2 C) (Oral)  Resp 20  Ht 5\' 10"  (1.778 m)  Wt 86.8 kg (191 lb 5.8 oz)  BMI 27.46 kg/m2  SpO2 99%  Physical Exam: General appearance: alert, cooperative and appears stated age Eyes: no scleral icterus Throat: oropharynx moist without erythema Resp: clear to auscultation bilaterally Cardio: regular rate and rhythm, S1, S2 normal, no murmur, click, rub or gallop GI: soft, non-tender; bowel sounds normal; no masses,  no organomegaly Extremities: no clubbing, cyanosis or edema  COLONOSCOPY PROCEDURE REPORT  PATIENT: [name], [name] MR#: [id number]  BIRTHDATE: Jan 30, 1931 , 82 yrs. old GENDER: Male  ENDOSCOPIST: [name] Essex, MD  REFERRED UX:YBFXOVA [name], M.D.  PROCEDURE DATE: 11/17/2013  PROCEDURE: Colonoscopy with biopsy, Colonoscopy with tissue  ablation, and Colonoscopy with control of bleeding  ASA  CLASS: Class II  INDICATIONS:hematochezia.  MEDICATIONS: Fentanyl 50 mcg IV and Versed 3 mg IV  DESCRIPTION OF PROCEDURE: After the risks benefits and  alternatives of the procedure were thoroughly explained, informed  consent was obtained. The Pentax Colonoscope [id number] endoscope  was introduced through the anus and advanced to the terminal ileum  which was intubated for a short distance , limited by No adverse  events experienced. The quality of the prep was adequate. . The  instrument was then slowly withdrawn as the colon was fully  examined.there is no signs of bleeding on insertion and in the  cecum was a small nodule  with probable visible vessel and we went  ahead and took 3 biopsies and because of increased bleeding we went  ahead and injected with a total of 3cc of 1-10,000 epinephrineover  2 injections and then used are APC to coagulate the lesion with  nice cessation of bleeding and the scope was then slowly withdrawn  and left-sided diverticuli were seen both proximal and distal to  his sigmoid anastomosis and the radiation proctitis which was  minimal was confirmed and after retroflexion was done the scope was  reinserted shortly to the left side of the colon air and water were  suctioned scope removed patient tolerated the procedure well there  was no obvious immediate complication  FINDINGS: 1 small cecal nodule status post biopsy and injection of  epinephrine and aPC2. Left-sided diverticuli large and small 3.  Sigmoid anastomosis 4. Few diverticuli distal to anastomosis 5.  Mild radiation proctitis 6 otherwise within normal limits to the  terminal ileum without signs of active bleeding  COMPLICATIONS:none  IMPRESSION: bleeding from diverticuli or cecal nodule  RECOMMENDATIONS: await biopsies slowly advance diet reevaluate  Coumadin needs and try to hold off for at least a few daysand will  follow with you and happy to see back when necessary in the future    Discharge Labs:  Recent Labs  11/17/13 0430 11/18/13 0350  NA 138 142  K 3.4* 3.9  CL 105 109  CO2 25 24  GLUCOSE 95 94  BUN 18 13  CREATININE 1.41* 1.37*  CALCIUM 8.2* 8.6    Recent Labs  11/17/13 0430  11/17/13 2302 11/18/13 0350  WBC 6.2  --   --  7.6  HGB 7.1*  < > 9.0* 9.8*  HCT 20.7*  < > 26.1* 27.9*  MCV 93.2  --   --  90.6  PLT 122*  --   --  148*  < > = values in this interval not displayed. Lab Results  Component Value Date   INR 0.94 11/18/2013   INR 1.20 11/17/2013   INR 1.27 11/16/2013   Discharge instructions:  Hold Coumadin until seen next week  Disposition: Home  Follow-up Appts: Follow-up  with Dr. Osborne [name] at [facility] in 1 week.  My nurse [name] will call for appointment.  Condition on Discharge: Improved Tests Needing Follow-up: Blood count in 1 week, if stable will resume Coumadin at that time.  Signed: Haywood [name] 11/18/2013, 7:21 AM

## 2013-11-24 DIAGNOSIS — D010 Carcinoma in situ of colon: Secondary | ICD-10-CM | POA: Diagnosis not present

## 2013-11-24 DIAGNOSIS — K921 Melena: Secondary | ICD-10-CM | POA: Diagnosis not present

## 2013-11-28 DIAGNOSIS — H43819 Vitreous degeneration, unspecified eye: Secondary | ICD-10-CM | POA: Diagnosis not present

## 2013-11-28 DIAGNOSIS — Z961 Presence of intraocular lens: Secondary | ICD-10-CM | POA: Diagnosis not present

## 2013-11-28 DIAGNOSIS — H40059 Ocular hypertension, unspecified eye: Secondary | ICD-10-CM | POA: Diagnosis not present

## 2013-11-28 DIAGNOSIS — H2640 Unspecified secondary cataract: Secondary | ICD-10-CM | POA: Diagnosis not present

## 2013-12-02 DIAGNOSIS — D500 Iron deficiency anemia secondary to blood loss (chronic): Secondary | ICD-10-CM | POA: Diagnosis not present

## 2013-12-02 DIAGNOSIS — Z7901 Long term (current) use of anticoagulants: Secondary | ICD-10-CM | POA: Diagnosis not present

## 2013-12-02 DIAGNOSIS — M109 Gout, unspecified: Secondary | ICD-10-CM | POA: Diagnosis not present

## 2013-12-02 DIAGNOSIS — I10 Essential (primary) hypertension: Secondary | ICD-10-CM | POA: Diagnosis not present

## 2013-12-02 DIAGNOSIS — C189 Malignant neoplasm of colon, unspecified: Secondary | ICD-10-CM | POA: Diagnosis not present

## 2013-12-08 DIAGNOSIS — L738 Other specified follicular disorders: Secondary | ICD-10-CM | POA: Diagnosis not present

## 2013-12-08 DIAGNOSIS — L708 Other acne: Secondary | ICD-10-CM | POA: Diagnosis not present

## 2013-12-08 DIAGNOSIS — L678 Other hair color and hair shaft abnormalities: Secondary | ICD-10-CM | POA: Diagnosis not present

## 2013-12-09 DIAGNOSIS — I10 Essential (primary) hypertension: Secondary | ICD-10-CM | POA: Diagnosis not present

## 2013-12-09 DIAGNOSIS — R609 Edema, unspecified: Secondary | ICD-10-CM | POA: Diagnosis not present

## 2013-12-09 DIAGNOSIS — C189 Malignant neoplasm of colon, unspecified: Secondary | ICD-10-CM | POA: Diagnosis not present

## 2013-12-09 DIAGNOSIS — I2699 Other pulmonary embolism without acute cor pulmonale: Secondary | ICD-10-CM | POA: Diagnosis not present

## 2013-12-09 DIAGNOSIS — M109 Gout, unspecified: Secondary | ICD-10-CM | POA: Diagnosis not present

## 2013-12-09 DIAGNOSIS — Z0000 Encounter for general adult medical examination without abnormal findings: Secondary | ICD-10-CM | POA: Diagnosis not present

## 2013-12-09 DIAGNOSIS — Z7901 Long term (current) use of anticoagulants: Secondary | ICD-10-CM | POA: Diagnosis not present

## 2013-12-09 DIAGNOSIS — N183 Chronic kidney disease, stage 3 unspecified: Secondary | ICD-10-CM | POA: Diagnosis not present

## 2013-12-09 DIAGNOSIS — Z125 Encounter for screening for malignant neoplasm of prostate: Secondary | ICD-10-CM | POA: Diagnosis not present

## 2013-12-12 ENCOUNTER — Encounter (INDEPENDENT_AMBULATORY_CARE_PROVIDER_SITE_OTHER): Payer: Self-pay

## 2013-12-15 ENCOUNTER — Encounter (INDEPENDENT_AMBULATORY_CARE_PROVIDER_SITE_OTHER): Payer: Self-pay | Admitting: General Surgery

## 2013-12-15 ENCOUNTER — Ambulatory Visit (INDEPENDENT_AMBULATORY_CARE_PROVIDER_SITE_OTHER): Payer: Medicare Other | Admitting: General Surgery

## 2013-12-15 VITALS — BP 126/74 | HR 68 | Temp 98.5°F | Resp 14 | Ht 70.0 in | Wt 197.2 lb

## 2013-12-15 DIAGNOSIS — C189 Malignant neoplasm of colon, unspecified: Secondary | ICD-10-CM | POA: Insufficient documentation

## 2013-12-15 NOTE — Progress Notes (Signed)
Patient ID: [name], male   DOB: 1931-01-28, 80 y.o.   MRN: [id number]  Chief Complaint  Patient presents with  . New Evaluation    eval lap cectomy    HPI [name] is a 80 y.o. male.   HPI  He is referred by Dr. Watt Climes because of newly diagnosed adenocarcinoma of the cecum.  Last month, he was admitted to the hospital with a lower GI bleed. He was taking Coumadin at the time. The Coumadin was stopped and the bleeding stopped. A colonoscopy was performed. This demonstrated some diverticular disease. There is also a mass in the cecum that was biopsied and positive for adenocarcinoma. He was not clear if this mass was bleeding orifice was possibly from diverticular disease. He has had a previous left colectomy for diverticular disease in the past. No family history of colon cancer. His CEA level is normal. Hemoglobin is up to 10.4. Liver function tests are normal.  He is here to discuss laparoscopic partial colectomy.  He is here with his wife.  Past Medical History  Diagnosis Date  . Hypertension   . Incontinence   . Diverticulitis   . Adenocarcinoma of colon     Past Surgical History  Procedure Laterality Date  . Laparoscopic incisional Hernia repair  1999  . Prostate removed  1992  . Colonoscopy N/A 11/17/2013    Procedure: COLONOSCOPY;  Surgeon: Jeryl Columbia, MD;  Location: [facility];  Service: Endoscopy;  Laterality: N/A;  . Hot hemostasis N/A 11/17/2013    Procedure: HOT HEMOSTASIS (ARGON PLASMA COAGULATION/BICAP);  Surgeon: Jeryl Columbia, MD;  Location: [facility];  Service: Endoscopy;  Laterality: N/A;  . Ileostomy Closure    . Left colectomy with loop ileostomy      History reviewed. No pertinent family history.  Social History History  Substance Use Topics  . Smoking status: Never Smoker   . Smokeless tobacco: Never Used  . Alcohol Use: No    Allergies  Allergen Reactions  . Sulfonamide Derivatives     REACTION: unspecified    Current  Outpatient Prescriptions  Medication Sig Dispense Refill  . ACZONE 5 % topical gel       . allopurinol (ZYLOPRIM) 300 MG tablet Take 300 mg by mouth daily.        Marland Kitchen amoxicillin (AMOXIL) 500 MG capsule       . carvedilol (COREG) 6.25 MG tablet Take 6.25 mg by mouth 2 (two) times daily with a meal.        . clindamycin (CLEOCIN T) 1 % lotion Apply 1 application topically 2 (two) times daily as needed (to bumps of groin area).      . Clobetasol Propionate 0.05 % lotion       . hydrochlorothiazide (HYDRODIURIL) 50 MG tablet Take 50 mg by mouth daily.      . Multiple Vitamins-Minerals (CENTRUM SILVER PO) Take by mouth.        . mupirocin ointment (BACTROBAN) 2 % Apply 1 application topically 2 (two) times daily as needed (bumps on the groin area).      . potassium chloride SA (K-DUR,KLOR-CON) 20 MEQ tablet Take 20 mEq by mouth daily.       No current facility-administered medications for this visit.    Review of Systems Review of Systems  Constitutional: Negative.   HENT: Positive for hearing loss.   Respiratory: Negative.   Cardiovascular: Negative.   Gastrointestinal: Negative.   Genitourinary:  He has some urinary dribbling and intermittent incontinence.    Blood pressure 126/74, pulse 68, temperature 98.5 F (36.9 C), temperature source Oral, resp. rate 14, height 5' 10" (1.778 m), weight 197 lb 3.2 oz (89.449 kg).  Physical Exam Physical Exam  Constitutional: He appears well-developed and well-nourished. No distress.  HENT:  Head: Normocephalic and atraumatic.  Eyes: EOM are normal. No scleral icterus.  Cardiovascular: Normal rate and regular rhythm.   Pulmonary/Chest: Effort normal and breath sounds normal.  Abdominal: Soft. He exhibits no distension and no mass. There is no tenderness.  Lower midline scars present. Limited transverse right lower quadrant scars present. Multiple small scars are noted. No palpable hernias.  Musculoskeletal: He exhibits no edema.   Neurological: He is alert.  Skin: Skin is warm and dry.  Psychiatric: He has a normal mood and affect. His behavior is normal.    Data Reviewed Colonoscopy report. Hospital notes. Pathology report. Laboratory reports.  Assessment    Iliac invasive adenocarcinoma of the cecum. This could have been the point of bleeding. He's had multiple abdominal operations including a hernia repair with mesh.     Plan    Laparoscopic possible open partial colectomy.  Preoperative CT scan for staging.  I have explained the procedure and risks of colon resection.  Risks include but are not limited to bleeding, infection, wound problems, anesthesia, anastomotic leak, need for colostomy, injury to intraabominal organs (such as intestine, spleen, kidney, bladder, ureter, etc.), need for reoperation, ileus, irregular bowel habits.  He seems to understand and agrees to proceed.        Elzina Devera J 12/15/2013, 2:34 PM    

## 2013-12-15 NOTE — Patient Instructions (Signed)
[facility]  ONE-DAY (1) PRE-OP HOME COLON PREP INSTRUCTIONS: ** MIRALAX / GATORADE PREP **  Fill the two prescriptions at a pharmacy of your choice.  You must follow the instructions below carefully.  If you have questions or problems, please call and speak to someone in the clinic department at our office:   [phone].  MIRALAX - GATORADE -- DULCOLAX TABS:   Fill the prescriptions for MIRALAX  (255 gm bottle)    In addition, purchase four (4) DULCOLAX TABLETS (no prescription required), and one 64 oz GATORADE.  (Do NOT purchase red Gatorade; any other flavor is acceptable).  ANITIBIOTICS:   There will be 2 different antibiotics.     Take both prescriptions THE AFTERNOON BEFORE your surgery, at the times written on the bottles.  INSTRUCTIONS: 1. Five days prior to your procedure do not eat nuts, popcorn, or fruit with seeds.  Stop all fiber supplements such as Metamucil, Citrucel, etc.  2. The day before your procedure: o 6:00am:  take (4) Dulcolax tablets.  You should remain on clear liquids for the entire day.   CLEAR LIQUIDS: clear bouillon, broth, jello (NOT RED), black coffee, tea, soda, etc o 10:00am:  add the bottle of MiraLax to the 64-oz bottle of Gatorade, and dissolve.  Begin drinking the Gatorade mixture until gone (8 oz every 15-30 minutes).  Continue clear liquids until midnight (or bedtime). o Take the antibiotics at the times instructed on the bottles.  3. The day of your procedure:   Do not eat or drink ANYTHING after midnight before your surgery.     If you take Heart or Blood Pressure medicine, ask the pre-op nurses about these during your preop appointment.   Further pre-operative instructions will be given to you from the hospital.   Expect to be contacted 5-7 days before your surgery.       

## 2013-12-19 ENCOUNTER — Ambulatory Visit
Admission: RE | Admit: 2013-12-19 | Discharge: 2013-12-19 | Disposition: A | Discharge status: Home / Self Care | Payer: Medicare Other | Source: Ambulatory Visit | Attending: General Surgery | Admitting: General Surgery

## 2013-12-19 DIAGNOSIS — Z01818 Encounter for other preprocedural examination: Secondary | ICD-10-CM | POA: Diagnosis not present

## 2013-12-19 DIAGNOSIS — C189 Malignant neoplasm of colon, unspecified: Secondary | ICD-10-CM

## 2013-12-19 DIAGNOSIS — C260 Malignant neoplasm of intestinal tract, part unspecified: Secondary | ICD-10-CM | POA: Diagnosis not present

## 2013-12-19 MED ORDER — IOHEXOL 300 MG/ML  SOLN
125.0000 mL | Freq: Once | INTRAMUSCULAR | Status: AC | PRN
  Administered 2013-12-19: 125 mL via INTRAVENOUS

## 2013-12-22 ENCOUNTER — Telehealth (INDEPENDENT_AMBULATORY_CARE_PROVIDER_SITE_OTHER): Payer: Self-pay

## 2013-12-22 NOTE — Telephone Encounter (Signed)
Pt made aware that CT scan showed no evidence of recurrent cancer.  Also, does not show what may be causing his pain.

## 2013-12-25 ENCOUNTER — Other Ambulatory Visit (INDEPENDENT_AMBULATORY_CARE_PROVIDER_SITE_OTHER): Payer: Self-pay | Admitting: General Surgery

## 2013-12-25 NOTE — Progress Notes (Signed)
Need orders in EPIC.  Surgery on 01/06/14.  Proep on 01/01/14 at 1000am.  Thank You.

## 2013-12-26 ENCOUNTER — Encounter (INDEPENDENT_AMBULATORY_CARE_PROVIDER_SITE_OTHER): Payer: Self-pay

## 2013-12-31 ENCOUNTER — Encounter (HOSPITAL_COMMUNITY): Payer: Self-pay | Admitting: Pharmacy Technician

## 2013-12-31 ENCOUNTER — Other Ambulatory Visit (HOSPITAL_COMMUNITY): Payer: Self-pay | Admitting: General Surgery

## 2014-01-01 ENCOUNTER — Encounter (HOSPITAL_COMMUNITY): Payer: Self-pay

## 2014-01-01 ENCOUNTER — Encounter (HOSPITAL_COMMUNITY)
Admission: RE | Admit: 2014-01-01 | Discharge: 2014-01-01 | Disposition: A | Discharge status: Home / Self Care | Payer: Medicare Other | Source: Ambulatory Visit | Attending: General Surgery | Admitting: General Surgery

## 2014-01-01 ENCOUNTER — Encounter (INDEPENDENT_AMBULATORY_CARE_PROVIDER_SITE_OTHER): Payer: Self-pay

## 2014-01-01 DIAGNOSIS — Z01812 Encounter for preprocedural laboratory examination: Secondary | ICD-10-CM | POA: Diagnosis not present

## 2014-01-01 HISTORY — DX: Personal history of other medical treatment: Z92.89

## 2014-01-01 HISTORY — DX: Gout, unspecified: M10.9

## 2014-01-01 HISTORY — DX: Malignant neoplasm of prostate: C61

## 2014-01-01 HISTORY — DX: Peripheral vascular disease, unspecified: I73.9

## 2014-01-01 LAB — CBC WITH DIFFERENTIAL/PLATELET
BASOS ABS: 0 10*3/uL (ref 0.0–0.1)
Basophils Relative: 0 % (ref 0–1)
Eosinophils Absolute: 0.3 10*3/uL (ref 0.0–0.7)
Eosinophils Relative: 5 % (ref 0–5)
HEMATOCRIT: 32.2 % — AB (ref 39.0–52.0)
Hemoglobin: 10.6 g/dL — ABNORMAL LOW (ref 13.0–17.0)
LYMPHS ABS: 0.9 10*3/uL (ref 0.7–4.0)
LYMPHS PCT: 14 % (ref 12–46)
MCH: 29 pg (ref 26.0–34.0)
MCHC: 32.9 g/dL (ref 30.0–36.0)
MCV: 88 fL (ref 78.0–100.0)
Monocytes Absolute: 0.5 10*3/uL (ref 0.1–1.0)
Monocytes Relative: 7 % (ref 3–12)
NEUTROS ABS: 4.7 10*3/uL (ref 1.7–7.7)
Neutrophils Relative %: 74 % (ref 43–77)
PLATELETS: 232 10*3/uL (ref 150–400)
RBC: 3.66 MIL/uL — ABNORMAL LOW (ref 4.22–5.81)
RDW: 15.5 % (ref 11.5–15.5)
WBC: 6.4 10*3/uL (ref 4.0–10.5)

## 2014-01-01 LAB — COMPREHENSIVE METABOLIC PANEL
ALK PHOS: 84 U/L (ref 39–117)
ALT: 16 U/L (ref 0–53)
AST: 20 U/L (ref 0–37)
Albumin: 3.8 g/dL (ref 3.5–5.2)
BILIRUBIN TOTAL: 0.4 mg/dL (ref 0.3–1.2)
BUN: 25 mg/dL — ABNORMAL HIGH (ref 6–23)
CALCIUM: 10.2 mg/dL (ref 8.4–10.5)
CHLORIDE: 103 meq/L (ref 96–112)
CO2: 28 meq/L (ref 19–32)
Creatinine, Ser: 1.36 mg/dL — ABNORMAL HIGH (ref 0.50–1.35)
GFR calc non Af Amer: 48 mL/min — ABNORMAL LOW (ref >90)
GFR, EST AFRICAN AMERICAN: 55 mL/min — AB (ref >90)
GLUCOSE: 108 mg/dL — AB (ref 70–99)
Potassium: 3.9 mEq/L (ref 3.7–5.3)
SODIUM: 141 meq/L (ref 137–147)
Total Protein: 6.7 g/dL (ref 6.0–8.3)

## 2014-01-01 LAB — PROTIME-INR
INR: 1 (ref 0.00–1.49)
Prothrombin Time: 13 seconds (ref 11.6–15.2)

## 2014-01-01 LAB — SURGICAL PCR SCREEN
MRSA, PCR: NEGATIVE
Staphylococcus aureus: NEGATIVE

## 2014-01-01 NOTE — Patient Instructions (Addendum)
      Your procedure is scheduled on:  01/06/14  TUESDAY  Report to [facility] at   [place]    AM.  Call this number if you have problems the morning of surgery: [phone]     BOWEL PREP AS PER OFFICE  NOTHING BY MOUTH AFTER MIDNIGHT  Monday NIGHT    Take these medicines the morning of surgery with A SIP OF WATER: Carvedilol   .  Contacts, dentures or partial plates, or metal hairpins  can not be worn to surgery. Your family will be responsible for glasses, dentures, hearing aides while you are in surgery  Leave suitcase in the car. After surgery it may be brought to your room.  For patients admitted to the hospital, checkout time is 11:00 AM day of  discharge.                DO NOT WEAR JEWELRY, LOTIONS, POWDERS, OR PERFUMES.  WOMEN-- DO NOT SHAVE LEGS OR UNDERARMS FOR 48 HOURS BEFORE SHOWERS. MEN MAY SHAVE FACE.  Patients discharged the day of surgery will not be allowed to drive home. IF going home the day of surgery, you must have a driver and someone to stay with you for the first 24 hours  Name and phone number of your driver:  admission                                                                      Please read over the following fact sheets that you were given: MRSA Information, Incentive Spirometry Sheet, Blood Transfusion Sheet  Information                     FAILURE TO [place]                                                  Patient Signature _____________________________

## 2014-01-01 NOTE — Progress Notes (Signed)
CMP faxed to Dr Zella Richer via [facility].  Called patient and confirmed with him that last food intake Sunday NIGHT-  Will begin CLEAR LIQUIDS Monday morning and drink fluids all day-  Nothing by mouth after midnight Monday NIGHT-- verbalized understanding

## 2014-01-01 NOTE — Progress Notes (Signed)
OV with EKG interpretation Dr Osborne Casco 2/15 ON CHART

## 2014-01-01 NOTE — Progress Notes (Signed)
EKG, chest 2/15  chart

## 2014-01-05 NOTE — Anesthesia Preprocedure Evaluation (Addendum)
Anesthesia Evaluation  Patient identified by MRN, date of birth, ID band Patient awake    Reviewed: Allergy & Precautions, H&P , NPO status , Patient's Chart, lab work & pertinent test results  Airway Mallampati: II TM Distance: >3 FB Neck ROM: Full    Dental no notable dental hx.    Pulmonary neg pulmonary ROS, former smoker,  breath sounds clear to auscultation  Pulmonary exam normal       Cardiovascular hypertension, Pt. on medications DVT Rhythm:Regular Rate:Normal     Neuro/Psych negative neurological ROS  negative psych ROS   GI/Hepatic negative GI ROS, Neg liver ROS,   Endo/Other  negative endocrine ROS  Renal/GU negative Renal ROS  negative genitourinary   Musculoskeletal negative musculoskeletal ROS (+)   Abdominal   Peds negative pediatric ROS (+)  Hematology  (+) anemia ,   Anesthesia Other Findings   Reproductive/Obstetrics negative OB ROS                          Anesthesia Physical Anesthesia Plan  ASA: II  Anesthesia Plan: General   Post-op Pain Management:    Induction: Intravenous  Airway Management Planned: Oral ETT  Additional Equipment:   Intra-op Plan:   Post-operative Plan: Extubation in OR  Informed Consent: I have reviewed the patients History and Physical, chart, labs and discussed the procedure including the risks, benefits and alternatives for the proposed anesthesia with the patient or authorized representative who has indicated his/her understanding and acceptance.   Dental advisory given  Plan Discussed with: CRNA and Surgeon  Anesthesia Plan Comments:         Anesthesia Quick Evaluation

## 2014-01-06 ENCOUNTER — Encounter (HOSPITAL_COMMUNITY)
Admission: RE | Disposition: A | Discharge status: Home / Self Care | Payer: Self-pay | Source: Ambulatory Visit | Attending: General Surgery

## 2014-01-06 ENCOUNTER — Inpatient Hospital Stay (HOSPITAL_COMMUNITY): Payer: Medicare Other | Admitting: Anesthesiology

## 2014-01-06 ENCOUNTER — Inpatient Hospital Stay (HOSPITAL_COMMUNITY)
Admission: RE | Admit: 2014-01-06 | Discharge: 2014-01-10 | DRG: 331 | Disposition: A | Discharge status: Home / Self Care | Payer: Medicare Other | Source: Ambulatory Visit | Attending: General Surgery | Admitting: General Surgery

## 2014-01-06 ENCOUNTER — Encounter (HOSPITAL_COMMUNITY): Payer: Self-pay | Admitting: *Deleted

## 2014-01-06 ENCOUNTER — Encounter (HOSPITAL_COMMUNITY): Payer: Medicare Other | Admitting: Anesthesiology

## 2014-01-06 DIAGNOSIS — K5730 Diverticulosis of large intestine without perforation or abscess without bleeding: Secondary | ICD-10-CM | POA: Diagnosis present

## 2014-01-06 DIAGNOSIS — M109 Gout, unspecified: Secondary | ICD-10-CM

## 2014-01-06 DIAGNOSIS — Z8546 Personal history of malignant neoplasm of prostate: Secondary | ICD-10-CM

## 2014-01-06 DIAGNOSIS — N189 Chronic kidney disease, unspecified: Secondary | ICD-10-CM | POA: Diagnosis not present

## 2014-01-06 DIAGNOSIS — M2550 Pain in unspecified joint: Secondary | ICD-10-CM

## 2014-01-06 DIAGNOSIS — Z86711 Personal history of pulmonary embolism: Secondary | ICD-10-CM

## 2014-01-06 DIAGNOSIS — E861 Hypovolemia: Secondary | ICD-10-CM | POA: Diagnosis not present

## 2014-01-06 DIAGNOSIS — K922 Gastrointestinal hemorrhage, unspecified: Secondary | ICD-10-CM

## 2014-01-06 DIAGNOSIS — Z79899 Other long term (current) drug therapy: Secondary | ICD-10-CM

## 2014-01-06 DIAGNOSIS — I129 Hypertensive chronic kidney disease with stage 1 through stage 4 chronic kidney disease, or unspecified chronic kidney disease: Secondary | ICD-10-CM | POA: Diagnosis not present

## 2014-01-06 DIAGNOSIS — D6489 Other specified anemias: Secondary | ICD-10-CM | POA: Diagnosis present

## 2014-01-06 DIAGNOSIS — K660 Peritoneal adhesions (postprocedural) (postinfection): Secondary | ICD-10-CM | POA: Diagnosis present

## 2014-01-06 DIAGNOSIS — C189 Malignant neoplasm of colon, unspecified: Secondary | ICD-10-CM | POA: Diagnosis not present

## 2014-01-06 DIAGNOSIS — R059 Cough, unspecified: Secondary | ICD-10-CM

## 2014-01-06 DIAGNOSIS — K5732 Diverticulitis of large intestine without perforation or abscess without bleeding: Secondary | ICD-10-CM | POA: Diagnosis not present

## 2014-01-06 DIAGNOSIS — K222 Esophageal obstruction: Secondary | ICD-10-CM

## 2014-01-06 DIAGNOSIS — E550 Rickets, active: Secondary | ICD-10-CM | POA: Diagnosis not present

## 2014-01-06 DIAGNOSIS — R229 Localized swelling, mass and lump, unspecified: Secondary | ICD-10-CM

## 2014-01-06 DIAGNOSIS — R05 Cough: Secondary | ICD-10-CM

## 2014-01-06 DIAGNOSIS — I10 Essential (primary) hypertension: Secondary | ICD-10-CM | POA: Diagnosis not present

## 2014-01-06 DIAGNOSIS — C180 Malignant neoplasm of cecum: Secondary | ICD-10-CM | POA: Diagnosis not present

## 2014-01-06 DIAGNOSIS — Z9049 Acquired absence of other specified parts of digestive tract: Secondary | ICD-10-CM | POA: Diagnosis not present

## 2014-01-06 DIAGNOSIS — Z882 Allergy status to sulfonamides status: Secondary | ICD-10-CM | POA: Diagnosis not present

## 2014-01-06 DIAGNOSIS — L719 Rosacea, unspecified: Secondary | ICD-10-CM

## 2014-01-06 HISTORY — PX: LAPAROSCOPIC LYSIS OF ADHESIONS: SHX5905

## 2014-01-06 HISTORY — PX: LAPAROSCOPIC PARTIAL COLECTOMY: SHX5907

## 2014-01-06 LAB — TYPE AND SCREEN
ABO/RH(D): O POS
ANTIBODY SCREEN: NEGATIVE

## 2014-01-06 SURGERY — LAPAROSCOPIC PARTIAL COLECTOMY
Anesthesia: General | Site: Abdomen

## 2014-01-06 MED ORDER — CISATRACURIUM BESYLATE (PF) 10 MG/5ML IV SOLN
INTRAVENOUS | Status: DC | PRN
  Administered 2014-01-06 (×2): 2 mg via INTRAVENOUS
  Administered 2014-01-06: 8 mg via INTRAVENOUS
  Administered 2014-01-06 (×3): 2 mg via INTRAVENOUS

## 2014-01-06 MED ORDER — NEOSTIGMINE METHYLSULFATE 1 MG/ML IJ SOLN
INTRAMUSCULAR | Status: DC | PRN
  Administered 2014-01-06: 5 mg via INTRAVENOUS

## 2014-01-06 MED ORDER — FENTANYL CITRATE 0.05 MG/ML IJ SOLN
INTRAMUSCULAR | Status: DC | PRN
  Administered 2014-01-06 (×4): 50 ug via INTRAVENOUS

## 2014-01-06 MED ORDER — ACETAMINOPHEN 10 MG/ML IV SOLN
1000.0000 mg | Freq: Once | INTRAVENOUS | Status: AC
  Administered 2014-01-06: 1000 mg via INTRAVENOUS
  Filled 2014-01-06: qty 100

## 2014-01-06 MED ORDER — ONDANSETRON HCL 4 MG/2ML IJ SOLN
4.0000 mg | INTRAMUSCULAR | Status: DC | PRN
Start: 2014-01-06 — End: 2014-01-10
  Administered 2014-01-09: 4 mg via INTRAVENOUS
  Filled 2014-01-06: qty 2

## 2014-01-06 MED ORDER — ALVIMOPAN 12 MG PO CAPS: 12.0000 mg | ORAL_CAPSULE | Freq: Two times a day (BID) | ORAL | Status: DC

## 2014-01-06 MED ORDER — SODIUM CHLORIDE 0.9 % IR SOLN
Status: DC | PRN
  Administered 2014-01-06: 1000 mL
  Administered 2014-01-06: 3000 mL

## 2014-01-06 MED ORDER — DEXTROSE 5 % IV SOLN
1.0000 g | Freq: Once | INTRAVENOUS | Status: AC
  Administered 2014-01-06: 1 g via INTRAVENOUS

## 2014-01-06 MED ORDER — EPHEDRINE SULFATE 50 MG/ML IJ SOLN
INTRAMUSCULAR | Status: DC | PRN
  Administered 2014-01-06: 10 mg via INTRAVENOUS

## 2014-01-06 MED ORDER — LIDOCAINE HCL (CARDIAC) 20 MG/ML IV SOLN
INTRAVENOUS | Status: AC
  Filled 2014-01-06: qty 5

## 2014-01-06 MED ORDER — KCL-LACTATED RINGERS-D5W 20 MEQ/L IV SOLN
INTRAVENOUS | Status: DC
  Administered 2014-01-06: 15:00:00 via INTRAVENOUS
  Administered 2014-01-06: 125 mL via INTRAVENOUS
  Administered 2014-01-07: 15:00:00 via INTRAVENOUS
  Administered 2014-01-07 – 2014-01-08 (×3): 125 mL via INTRAVENOUS
  Administered 2014-01-08: 14:00:00 via INTRAVENOUS
  Administered 2014-01-09: 75 mL via INTRAVENOUS
  Filled 2014-01-06 (×9): qty 1000

## 2014-01-06 MED ORDER — BUPIVACAINE-EPINEPHRINE PF 0.5-1:200000 % IJ SOLN
INTRAMUSCULAR | Status: AC
  Filled 2014-01-06: qty 30

## 2014-01-06 MED ORDER — PHENYLEPHRINE HCL 10 MG/ML IJ SOLN
INTRAMUSCULAR | Status: DC | PRN
Start: 2014-01-06 — End: 2014-01-06
  Administered 2014-01-06 (×3): 80 ug via INTRAVENOUS

## 2014-01-06 MED ORDER — LACTATED RINGERS IV SOLN: INTRAVENOUS | Status: DC

## 2014-01-06 MED ORDER — PROPOFOL 10 MG/ML IV BOLUS
INTRAVENOUS | Status: DC | PRN
  Administered 2014-01-06: 140 mg via INTRAVENOUS

## 2014-01-06 MED ORDER — SODIUM CHLORIDE 0.9 % IJ SOLN
9.0000 mL | INTRAMUSCULAR | Status: DC | PRN
Start: 2014-01-06 — End: 2014-01-09

## 2014-01-06 MED ORDER — HEPARIN SODIUM (PORCINE) 5000 UNIT/ML IJ SOLN
5000.0000 [IU] | Freq: Three times a day (TID) | INTRAMUSCULAR | Status: DC
  Administered 2014-01-07 – 2014-01-10 (×10): 5000 [IU] via SUBCUTANEOUS
  Filled 2014-01-06 (×13): qty 1

## 2014-01-06 MED ORDER — DIPHENHYDRAMINE HCL 50 MG/ML IJ SOLN: 12.5000 mg | Freq: Four times a day (QID) | INTRAMUSCULAR | Status: DC | PRN

## 2014-01-06 MED ORDER — ONDANSETRON HCL 4 MG/2ML IJ SOLN
INTRAMUSCULAR | Status: AC
  Filled 2014-01-06: qty 2

## 2014-01-06 MED ORDER — DEXTROSE 5 % IV SOLN: 30.0000 ug/min | INTRAVENOUS | Status: DC

## 2014-01-06 MED ORDER — DEXAMETHASONE SODIUM PHOSPHATE 10 MG/ML IJ SOLN
INTRAMUSCULAR | Status: AC
  Filled 2014-01-06: qty 1

## 2014-01-06 MED ORDER — ONDANSETRON HCL 4 MG/2ML IJ SOLN: 4.0000 mg | Freq: Four times a day (QID) | INTRAMUSCULAR | Status: DC | PRN

## 2014-01-06 MED ORDER — GLYCOPYRROLATE 0.2 MG/ML IJ SOLN
INTRAMUSCULAR | Status: DC | PRN
  Administered 2014-01-06: .8 mg via INTRAVENOUS
  Administered 2014-01-06: 0.2 mg via INTRAVENOUS

## 2014-01-06 MED ORDER — DIPHENHYDRAMINE HCL 12.5 MG/5ML PO ELIX: 12.5000 mg | ORAL_SOLUTION | Freq: Four times a day (QID) | ORAL | Status: DC | PRN

## 2014-01-06 MED ORDER — PANTOPRAZOLE SODIUM 40 MG IV SOLR
40.0000 mg | INTRAVENOUS | Status: DC
  Administered 2014-01-06 – 2014-01-08 (×3): 40 mg via INTRAVENOUS
  Filled 2014-01-06 (×4): qty 40

## 2014-01-06 MED ORDER — HYDROMORPHONE HCL PF 2 MG/ML IJ SOLN
INTRAMUSCULAR | Status: AC
  Filled 2014-01-06: qty 1

## 2014-01-06 MED ORDER — LACTATED RINGERS IV SOLN
INTRAVENOUS | Status: DC | PRN
  Administered 2014-01-06 (×2): via INTRAVENOUS

## 2014-01-06 MED ORDER — GLYCOPYRROLATE 0.2 MG/ML IJ SOLN
INTRAMUSCULAR | Status: AC
  Filled 2014-01-06: qty 3

## 2014-01-06 MED ORDER — PROPOFOL 10 MG/ML IV BOLUS
INTRAVENOUS | Status: AC
  Filled 2014-01-06: qty 20

## 2014-01-06 MED ORDER — PROMETHAZINE HCL 25 MG/ML IJ SOLN: 6.2500 mg | INTRAMUSCULAR | Status: DC | PRN

## 2014-01-06 MED ORDER — ATROPINE SULFATE 0.4 MG/ML IJ SOLN
INTRAMUSCULAR | Status: AC
  Filled 2014-01-06: qty 1

## 2014-01-06 MED ORDER — CARVEDILOL 6.25 MG PO TABS
6.2500 mg | ORAL_TABLET | Freq: Two times a day (BID) | ORAL | Status: DC
  Administered 2014-01-06 – 2014-01-10 (×8): 6.25 mg via ORAL
  Filled 2014-01-06 (×10): qty 1

## 2014-01-06 MED ORDER — HYDROMORPHONE HCL PF 1 MG/ML IJ SOLN
INTRAMUSCULAR | Status: DC | PRN
  Administered 2014-01-06 (×4): 0.5 mg via INTRAVENOUS

## 2014-01-06 MED ORDER — MORPHINE SULFATE (PF) 1 MG/ML IV SOLN
INTRAVENOUS | Status: DC
  Administered 2014-01-06: 14 mg via INTRAVENOUS
  Administered 2014-01-06 (×2): via INTRAVENOUS
  Administered 2014-01-06 – 2014-01-07 (×2): 9 mg via INTRAVENOUS
  Administered 2014-01-07: 2 mg via INTRAVENOUS
  Administered 2014-01-07: 1 mg via INTRAVENOUS
  Administered 2014-01-07: 8 mg via INTRAVENOUS
  Administered 2014-01-07: 3.48 mg via INTRAVENOUS
  Administered 2014-01-07: 13:00:00 via INTRAVENOUS
  Administered 2014-01-07 – 2014-01-08 (×2): 2 mg via INTRAVENOUS
  Filled 2014-01-06 (×2): qty 25

## 2014-01-06 MED ORDER — DEXTROSE 5 % IV SOLN
2.0000 g | Freq: Two times a day (BID) | INTRAVENOUS | Status: AC
  Administered 2014-01-06: 2 g via INTRAVENOUS
  Filled 2014-01-06: qty 2

## 2014-01-06 MED ORDER — SUCCINYLCHOLINE CHLORIDE 20 MG/ML IJ SOLN
INTRAMUSCULAR | Status: AC
  Filled 2014-01-06: qty 1

## 2014-01-06 MED ORDER — MORPHINE SULFATE (PF) 1 MG/ML IV SOLN
INTRAVENOUS | Status: AC
  Filled 2014-01-06: qty 25

## 2014-01-06 MED ORDER — SUCCINYLCHOLINE CHLORIDE 20 MG/ML IJ SOLN
INTRAMUSCULAR | Status: DC | PRN
  Administered 2014-01-06: 100 mg via INTRAVENOUS

## 2014-01-06 MED ORDER — DEXAMETHASONE SODIUM PHOSPHATE 10 MG/ML IJ SOLN
INTRAMUSCULAR | Status: DC | PRN
  Administered 2014-01-06: 10 mg via INTRAVENOUS

## 2014-01-06 MED ORDER — KETOROLAC TROMETHAMINE 30 MG/ML IJ SOLN: 15.0000 mg | Freq: Once | INTRAMUSCULAR | Status: DC | PRN

## 2014-01-06 MED ORDER — PHENYLEPHRINE 40 MCG/ML (10ML) SYRINGE FOR IV PUSH (FOR BLOOD PRESSURE SUPPORT)
PREFILLED_SYRINGE | INTRAVENOUS | Status: AC
  Filled 2014-01-06: qty 10

## 2014-01-06 MED ORDER — CISATRACURIUM BESYLATE 20 MG/10ML IV SOLN
INTRAVENOUS | Status: AC
  Filled 2014-01-06: qty 10

## 2014-01-06 MED ORDER — EPHEDRINE SULFATE 50 MG/ML IJ SOLN
INTRAMUSCULAR | Status: AC
  Filled 2014-01-06: qty 1

## 2014-01-06 MED ORDER — ONDANSETRON HCL 4 MG/2ML IJ SOLN
INTRAMUSCULAR | Status: DC | PRN
  Administered 2014-01-06: 4 mg via INTRAVENOUS

## 2014-01-06 MED ORDER — PHENYLEPHRINE HCL 10 MG/ML IJ SOLN
INTRAMUSCULAR | Status: AC
  Filled 2014-01-06: qty 1

## 2014-01-06 MED ORDER — FENTANYL CITRATE 0.05 MG/ML IJ SOLN
INTRAMUSCULAR | Status: AC
  Filled 2014-01-06: qty 5

## 2014-01-06 MED ORDER — CEFOTETAN DISODIUM-DEXTROSE 1-3.58 GM-% IV SOLR
INTRAVENOUS | Status: AC
  Filled 2014-01-06: qty 50

## 2014-01-06 MED ORDER — NALOXONE HCL 0.4 MG/ML IJ SOLN: 0.4000 mg | INTRAMUSCULAR | Status: DC | PRN

## 2014-01-06 MED ORDER — SODIUM CHLORIDE 0.9 % IJ SOLN
INTRAMUSCULAR | Status: AC
  Filled 2014-01-06: qty 10

## 2014-01-06 MED ORDER — PHENYLEPHRINE HCL 10 MG/ML IJ SOLN
10.0000 mg | INTRAVENOUS | Status: DC | PRN
  Administered 2014-01-06: 40 ug/min via INTRAVENOUS

## 2014-01-06 MED ORDER — GLYCOPYRROLATE 0.2 MG/ML IJ SOLN
INTRAMUSCULAR | Status: AC
  Filled 2014-01-06: qty 2

## 2014-01-06 MED ORDER — BUPIVACAINE-EPINEPHRINE PF 0.5-1:200000 % IJ SOLN
INTRAMUSCULAR | Status: DC | PRN
  Administered 2014-01-06: 15 mL

## 2014-01-06 MED ORDER — LACTATED RINGERS IV SOLN
INTRAVENOUS | Status: DC | PRN
  Administered 2014-01-06: 500 mL via INTRAVENOUS

## 2014-01-06 MED ORDER — ONDANSETRON HCL 4 MG PO TABS: 4.0000 mg | ORAL_TABLET | Freq: Four times a day (QID) | ORAL | Status: DC | PRN

## 2014-01-06 MED ORDER — NEOSTIGMINE METHYLSULFATE 1 MG/ML IJ SOLN
INTRAMUSCULAR | Status: AC
  Filled 2014-01-06: qty 10

## 2014-01-06 MED ORDER — LIDOCAINE HCL (CARDIAC) 20 MG/ML IV SOLN
INTRAVENOUS | Status: DC | PRN
  Administered 2014-01-06: 80 mg via INTRAVENOUS

## 2014-01-06 MED ORDER — HYDROMORPHONE HCL PF 1 MG/ML IJ SOLN: 0.2500 mg | INTRAMUSCULAR | Status: DC | PRN

## 2014-01-06 SURGICAL SUPPLY — 73 items
APPLIER CLIP 5 13 M/L LIGAMAX5 (MISCELLANEOUS)
APPLIER CLIP ROT 10 11.4 M/L (STAPLE)
BLADE EXTENDED COATED 6.5IN (ELECTRODE) ×3 IMPLANT
BLADE HEX COATED 2.75 (ELECTRODE) ×6 IMPLANT
BLADE SURG SZ10 CARB STEEL (BLADE) ×3 IMPLANT
CABLE HIGH FREQUENCY MONO STRZ (ELECTRODE) ×3 IMPLANT
CANISTER SUCTION 2500CC (MISCELLANEOUS) IMPLANT
CELLS DAT CNTRL 66122 CELL SVR (MISCELLANEOUS) ×1 IMPLANT
CLIP APPLIE 5 13 M/L LIGAMAX5 (MISCELLANEOUS) IMPLANT
CLIP APPLIE ROT 10 11.4 M/L (STAPLE) IMPLANT
COUNTER NEEDLE 20 DBL MAG RED (NEEDLE) ×3 IMPLANT
COVER MAYO STAND STRL (DRAPES) ×6 IMPLANT
DECANTER SPIKE VIAL GLASS SM (MISCELLANEOUS) IMPLANT
DISSECTOR BLUNT TIP ENDO 5MM (MISCELLANEOUS) ×3 IMPLANT
DRAIN CHANNEL 19F RND (DRAIN) IMPLANT
DRAPE LAPAROSCOPIC ABDOMINAL (DRAPES) ×3 IMPLANT
DRAPE LG THREE QUARTER DISP (DRAPES) ×3 IMPLANT
DRAPE UTILITY XL STRL (DRAPES) ×6 IMPLANT
DRAPE WARM FLUID 44X44 (DRAPE) ×3 IMPLANT
DRSG OPSITE POSTOP 4X10 (GAUZE/BANDAGES/DRESSINGS) IMPLANT
DRSG OPSITE POSTOP 4X6 (GAUZE/BANDAGES/DRESSINGS) IMPLANT
DRSG OPSITE POSTOP 4X8 (GAUZE/BANDAGES/DRESSINGS) IMPLANT
DRSG TEGADERM 2-3/8X2-3/4 SM (GAUZE/BANDAGES/DRESSINGS) ×12 IMPLANT
ELECT REM PT RETURN 9FT ADLT (ELECTROSURGICAL) ×3
ELECTRODE REM PT RTRN 9FT ADLT (ELECTROSURGICAL) ×1 IMPLANT
EVACUATOR SILICONE 100CC (DRAIN) IMPLANT
FILTER SMOKE EVAC LAPAROSHD (FILTER) IMPLANT
GLOVE ECLIPSE 8.0 STRL XLNG CF (GLOVE) ×6 IMPLANT
GLOVE INDICATOR 8.0 STRL GRN (GLOVE) ×6 IMPLANT
GOWN STRL REUS W/TWL XL LVL3 (GOWN DISPOSABLE) ×30 IMPLANT
KIT BASIN OR (CUSTOM PROCEDURE TRAY) ×3 IMPLANT
LEGGING LITHOTOMY PAIR STRL (DRAPES) IMPLANT
LIGASURE IMPACT 36 18CM CVD LR (INSTRUMENTS) ×3 IMPLANT
PENCIL BUTTON HOLSTER BLD 10FT (ELECTRODE) ×9 IMPLANT
RELOAD PROXIMATE 75MM BLUE (ENDOMECHANICALS) ×3 IMPLANT
RTRCTR WOUND ALEXIS 18CM MED (MISCELLANEOUS) ×3
SCALPEL HARMONIC ACE (MISCELLANEOUS) ×3 IMPLANT
SCISSORS LAP 5X35 DISP (ENDOMECHANICALS) ×3 IMPLANT
SET IRRIG TUBING LAPAROSCOPIC (IRRIGATION / IRRIGATOR) ×3 IMPLANT
SLEEVE XCEL OPT CAN 5 100 (ENDOMECHANICALS) ×6 IMPLANT
SOLUTION ANTI FOG 6CC (MISCELLANEOUS) ×3 IMPLANT
SPONGE GAUZE 4X4 12PLY (GAUZE/BANDAGES/DRESSINGS) ×3 IMPLANT
SPONGE LAP 18X18 X RAY DECT (DISPOSABLE) ×9 IMPLANT
STAPLER PROXIMATE 75MM BLUE (STAPLE) ×3 IMPLANT
STAPLER VISISTAT 35W (STAPLE) ×3 IMPLANT
SUCTION POOLE TIP (SUCTIONS) ×6 IMPLANT
SUT ETHILON 2 0 PS N (SUTURE) IMPLANT
SUT PDS AB 1 CTX 36 (SUTURE) IMPLANT
SUT PDS AB 1 TP1 96 (SUTURE) ×6 IMPLANT
SUT PROLENE 2 0 KS (SUTURE) IMPLANT
SUT PROLENE 2 0 SH DA (SUTURE) IMPLANT
SUT SILK 2 0 (SUTURE) ×2
SUT SILK 2 0 SH CR/8 (SUTURE) ×3 IMPLANT
SUT SILK 2-0 18XBRD TIE 12 (SUTURE) ×1 IMPLANT
SUT SILK 3 0 (SUTURE) ×2
SUT SILK 3 0 SH CR/8 (SUTURE) ×9 IMPLANT
SUT SILK 3-0 18XBRD TIE 12 (SUTURE) ×1 IMPLANT
SUT VIC AB 3-0 SH 27 (SUTURE) ×4
SUT VIC AB 3-0 SH 27XBRD (SUTURE) ×2 IMPLANT
SUT VICRYL 2 0 18  UND BR (SUTURE) ×2
SUT VICRYL 2 0 18 UND BR (SUTURE) ×1 IMPLANT
SYR BULB IRRIGATION 50ML (SYRINGE) ×3 IMPLANT
SYS LAPSCP GELPORT 120MM (MISCELLANEOUS)
SYSTEM LAPSCP GELPORT 120MM (MISCELLANEOUS) IMPLANT
TOWEL OR 17X26 10 PK STRL BLUE (TOWEL DISPOSABLE) ×6 IMPLANT
TOWEL OR NON WOVEN STRL DISP B (DISPOSABLE) ×6 IMPLANT
TRAY FOLEY CATH 14FRSI W/METER (CATHETERS) ×3 IMPLANT
TRAY LAP CHOLE (CUSTOM PROCEDURE TRAY) ×3 IMPLANT
TROCAR BLADELESS OPT 5 100 (ENDOMECHANICALS) ×6 IMPLANT
TROCAR XCEL BLUNT TIP 100MML (ENDOMECHANICALS) IMPLANT
TROCAR XCEL NON-BLD 11X100MML (ENDOMECHANICALS) IMPLANT
TUBING INSUFFLATION 10FT LAP (TUBING) ×3 IMPLANT
YANKAUER SUCT BULB TIP 10FT TU (MISCELLANEOUS) ×6 IMPLANT

## 2014-01-06 NOTE — Progress Notes (Signed)
Total urine output in PACU- 30 cc

## 2014-01-06 NOTE — Preoperative (Signed)
Beta Blockers   Reason not to administer Beta Blockers:Not Applicable, took coreg 12/01/45 am.

## 2014-01-06 NOTE — Progress Notes (Signed)
Dr. Kalman Shan made aware of patient's urine output in the O.R. And thus far in PACU  OF 20 cc.

## 2014-01-06 NOTE — Brief Op Note (Signed)
01/06/2014  11:11 AM  PATIENT:  [name]  80 y.o. male  PRE-OPERATIVE DIAGNOSIS:  colon cancer  POST-OPERATIVE DIAGNOSIS:  colon cancer  PROCEDURE:  Procedure(s): LAPAROSCOPIC ASSISTED PARTIAL COLECTOMY (N/A) LAPAROSCOPIC LYSIS OF ADHESIONS (N/A)  SURGEON:  Surgeon(s) and Role:    * Odis Hollingshead, MD - Primary    * Harl Bowie, MD - Assisting   ANESTHESIA:   general  EBL:  Total I/O In: 1000 [I.V.:1000] Out: 350 [Urine:150; Blood:200]  BLOOD ADMINISTERED:none  DRAINS: none   LOCAL MEDICATIONS USED:  MARCAINE     SPECIMEN:  Source of Specimen:  right colon  DISPOSITION OF SPECIMEN:  PATHOLOGY  COUNTS:  YES  TOURNIQUET:  * No tourniquets in log *  DICTATION: .Dragon Dictation  PLAN OF CARE: Admit to inpatient   PATIENT DISPOSITION:  PACU - hemodynamically stable.   Delay start of Pharmacological VTE agent (>24hrs) due to surgical blood loss or risk of bleeding: yes

## 2014-01-06 NOTE — H&P (View-Only) (Signed)
Patient ID: [name], male   DOB: 1931-01-28, 80 y.o.   MRN: [id number]  Chief Complaint  Patient presents with  . New Evaluation    eval lap cectomy    HPI [name] is a 80 y.o. male.   HPI  He is referred by Dr. Watt Climes because of newly diagnosed adenocarcinoma of the cecum.  Last month, he was admitted to the hospital with a lower GI bleed. He was taking Coumadin at the time. The Coumadin was stopped and the bleeding stopped. A colonoscopy was performed. This demonstrated some diverticular disease. There is also a mass in the cecum that was biopsied and positive for adenocarcinoma. He was not clear if this mass was bleeding orifice was possibly from diverticular disease. He has had a previous left colectomy for diverticular disease in the past. No family history of colon cancer. His CEA level is normal. Hemoglobin is up to 10.4. Liver function tests are normal.  He is here to discuss laparoscopic partial colectomy.  He is here with his wife.  Past Medical History  Diagnosis Date  . Hypertension   . Incontinence   . Diverticulitis   . Adenocarcinoma of colon     Past Surgical History  Procedure Laterality Date  . Laparoscopic incisional Hernia repair  1999  . Prostate removed  1992  . Colonoscopy N/A 11/17/2013    Procedure: COLONOSCOPY;  Surgeon: Jeryl Columbia, MD;  Location: [facility];  Service: Endoscopy;  Laterality: N/A;  . Hot hemostasis N/A 11/17/2013    Procedure: HOT HEMOSTASIS (ARGON PLASMA COAGULATION/BICAP);  Surgeon: Jeryl Columbia, MD;  Location: [facility];  Service: Endoscopy;  Laterality: N/A;  . Ileostomy Closure    . Left colectomy with loop ileostomy      History reviewed. No pertinent family history.  Social History History  Substance Use Topics  . Smoking status: Never Smoker   . Smokeless tobacco: Never Used  . Alcohol Use: No    Allergies  Allergen Reactions  . Sulfonamide Derivatives     REACTION: unspecified    Current  Outpatient Prescriptions  Medication Sig Dispense Refill  . ACZONE 5 % topical gel       . allopurinol (ZYLOPRIM) 300 MG tablet Take 300 mg by mouth daily.        Marland Kitchen amoxicillin (AMOXIL) 500 MG capsule       . carvedilol (COREG) 6.25 MG tablet Take 6.25 mg by mouth 2 (two) times daily with a meal.        . clindamycin (CLEOCIN T) 1 % lotion Apply 1 application topically 2 (two) times daily as needed (to bumps of groin area).      . Clobetasol Propionate 0.05 % lotion       . hydrochlorothiazide (HYDRODIURIL) 50 MG tablet Take 50 mg by mouth daily.      . Multiple Vitamins-Minerals (CENTRUM SILVER PO) Take by mouth.        . mupirocin ointment (BACTROBAN) 2 % Apply 1 application topically 2 (two) times daily as needed (bumps on the groin area).      . potassium chloride SA (K-DUR,KLOR-CON) 20 MEQ tablet Take 20 mEq by mouth daily.       No current facility-administered medications for this visit.    Review of Systems Review of Systems  Constitutional: Negative.   HENT: Positive for hearing loss.   Respiratory: Negative.   Cardiovascular: Negative.   Gastrointestinal: Negative.   Genitourinary:  He has some urinary dribbling and intermittent incontinence.    Blood pressure 126/74, pulse 68, temperature 98.5 F (36.9 C), temperature source Oral, resp. rate 14, height 5\' 10"  (1.778 m), weight 197 lb 3.2 oz (89.449 kg).  Physical Exam Physical Exam  Constitutional: He appears well-developed and well-nourished. No distress.  HENT:  Head: Normocephalic and atraumatic.  Eyes: EOM are normal. No scleral icterus.  Cardiovascular: Normal rate and regular rhythm.   Pulmonary/Chest: Effort normal and breath sounds normal.  Abdominal: Soft. He exhibits no distension and no mass. There is no tenderness.  Lower midline scars present. Limited transverse right lower quadrant scars present. Multiple small scars are noted. No palpable hernias.  Musculoskeletal: He exhibits no edema.   Neurological: He is alert.  Skin: Skin is warm and dry.  Psychiatric: He has a normal mood and affect. His behavior is normal.    Data Reviewed Colonoscopy report. Hospital notes. Pathology report. Laboratory reports.  Assessment    Iliac invasive adenocarcinoma of the cecum. This could have been the point of bleeding. He's had multiple abdominal operations including a hernia repair with mesh.     Plan    Laparoscopic possible open partial colectomy.  Preoperative CT scan for staging.  I have explained the procedure and risks of colon resection.  Risks include but are not limited to bleeding, infection, wound problems, anesthesia, anastomotic leak, need for colostomy, injury to intraabominal organs (such as intestine, spleen, kidney, bladder, ureter, etc.), need for reoperation, ileus, irregular bowel habits.  He seems to understand and agrees to proceed.        Jorryn Casagrande J 12/15/2013, 2:34 PM

## 2014-01-06 NOTE — Transfer of Care (Signed)
Immediate Anesthesia Transfer of Care Note  Patient: [name]  Procedure(s) Performed: Procedure(s) (LRB): LAPAROSCOPIC ASSISTED PARTIAL COLECTOMY (N/A) LAPAROSCOPIC LYSIS OF ADHESIONS (N/A)  Patient Location: PACU  Anesthesia Type: General  Level of Consciousness: sedated, patient cooperative and responds to stimulation  Airway & Oxygen Therapy: Patient Spontanous Breathing and Patient connected to face mask oxgen  Post-op Assessment: Report given to PACU RN and Post -op Vital signs reviewed and stable  Post vital signs: Reviewed and stable  Complications: No apparent anesthesia complications

## 2014-01-06 NOTE — Anesthesia Postprocedure Evaluation (Signed)
  Anesthesia Post-op Note  Patient: [name]  Procedure(s) Performed: Procedure(s) (LRB): LAPAROSCOPIC ASSISTED PARTIAL COLECTOMY (N/A) LAPAROSCOPIC LYSIS OF ADHESIONS (N/A)  Patient Location: PACU  Anesthesia Type: General  Level of Consciousness: awake and alert   Airway and Oxygen Therapy: Patient Spontanous Breathing  Post-op Pain: mild  Post-op Assessment: Post-op Vital signs reviewed, Patient's Cardiovascular Status Stable, Respiratory Function Stable, Patent Airway and No signs of Nausea or vomiting  Last Vitals:  Filed Vitals:   01/06/14 1137  BP:   Pulse:   Temp:   Resp: 8    Post-op Vital Signs: stable   Complications: No apparent anesthesia complications

## 2014-01-06 NOTE — Op Note (Signed)
Operative Note  [name] male 80 y.o. 01/06/2014  PREOPERATIVE DX:  Cancer of the cecum  POSTOPERATIVE DX:  Same  PROCEDURE:  Laparoscopic lysis of adhesions (1 hour 40 minutes). Laparoscopic-assisted right colectomy.         Surgeon: Odis Hollingshead   Assistants: Coralie Keens M.D.  Anesthesia: General endotracheal anesthesia  Indications:   This is an 80 year old male who had some GI bleeding. Colonoscopy demonstrated a firm lesion in the cecum. Biopsy was performed and was consistent with adenocarcinoma. He now presents for laparoscopic assisted partial colectomy. He's had multiple intra-abdominal surgeries in the past including a ventral hernia repair with mesh.    Procedure Detail:  He was seen in the holding area. He was brought to the operating room placed supine on the operating table and general anesthetic was given. The hair in the abdominal wall was clipped. A Foley catheter was inserted. The abdominal wall was widely sterilely prepped and draped. A timeout was performed.  He was placed in slight reverse Trendelenburg position. A 5 mm incision was made in the left upper quadrant subcostal area. Using a 5 mm Optiview trocar a laparoscope access was gained into the peritoneal cavity a pneumoperitoneum was created. The laparoscope was introduced and inspection of the area below the trocar demonstrated no evidence of organ injury or bleeding. There were a significant number of omental adhesions to the anterior  wall and to mesh. A 5 mm trocar was placed in the left lateral abdomen and using sharp dissection I began dividing the adhesions. There also noted to be adhesions were fairly dense between the small bowel and the mesh. These were divided with careful blunt and sharp dissection. Another trocar was placed in the left lower quadrant. I continued to perform lysis of adhesions separating the omentum and small bowel from the mesh. There also adhesions between the small  bowel and the lateral down the wall on the right side which were divided. Eventually all adhesions were freed up from the abdominal wall. This took one hour and 40 minutes. I did note that there is a small hernia measuring 1 cm in size just inferior and lateral to where the mesh had deployed. I do not think this was large enough to be fixed at the time with mesh.  The right colon was identified. Based on the CT scan the cecum was going down to the pelvis. I then started mobilizing the right colon and cecum by dividing lateral attachments. There some adhesions to the cecum in the pelvis and these were thin and able to be divided sharply. Then I mobilized the hepatic flexure using the Harmonic scalpel to dissect the omentum. There were some and omental adhesions to the colon which were freed up sharply and bluntly.  The terminal ileum was identified going into the cecum. The distal ileum was fairly mobile. There appeared to be some superficial serosal defects in 2 areas of the small bowel. These were going to be inspected during the extraction site procedure.  In the right upper quadrant, superior to the mesh, a small transverse incision was made through the skin, subcutaneous tissue, fascia and peritoneum. Upon protection device was placed. The right colon, terminal ileum, and proximal transverse colon were exteriorized. The terminal ileum was divided with the linear cutting stapler. The colon was divided at the proximal transverse colon area of linear cutting stapler. The mesentery was divided with the LigaSure and vessels were also oversewn. The specimen (terminal ileum, right colon, proximal  transverse colon) was passed off the field and taken to the back table. It was opened up and the lesion in the cecum was noted.  The small bowel was then inspected. The 2 areas of superficial serosal defects were closed with interrupted 3-0 silk Lembert type sutures. On this, a side-to-side anastomosis between the distal  ileum and the transverse colon was performed with the linear cutting stapler. The anastomotic staple line was hemostatic. The common defect was closed in 2 layers. The first layer was a running, locking 3-0 Vicryl layer. A second layer was interrupted 3-0 silk in a Lembert-type fashion. A crotch stitch of 3-0 silk was placed. The anastomosis was patent, viable, and under no tension. There is no evidence of leak grossly.  The anastomosis was placed back into the abdominal cavity. Copious irrigation, cavity was performed. There is no evidence of bleeding or organ injury. The fascia of the extraction site incision was closed with running double looped #1 PDS. Laparoscopy was performed and a four-quadrant inspection was performed. There was no evidence of bleeding or organ injury. The trocars were removed and the pneumoperitoneum was released.  The skin incisions were closed with staples. Sterile dressings were applied. He tolerated the procedure well without any apparent complications and was taken to the recovery room in satisfactory condition.  Estimated Blood Loss:  300 mL         Drains: none   Blood Given: none          Specimens: Terminal ileum, right colon, proximal transverse colon.        Complications:  * No complications entered in OR log *         Disposition: PACU - hemodynamically stable.         Condition: stable

## 2014-01-06 NOTE — Progress Notes (Signed)
Entereg Post Op Requirements  A/P: patient did not receive pre-op Entereg dose so per protocol cannot start post op doses.   Adrian Saran, PharmD, BCPS Pager [phone] 01/06/2014 1:30 PM

## 2014-01-06 NOTE — Interval H&P Note (Signed)
History and Physical Interval Note:  01/06/2014 7:16 AM  [name]  has presented today for surgery, with the diagnosis of colon cancer  The various methods of treatment have been discussed with the patient and family. After consideration of risks, benefits and other options for treatment, the patient has consented to  Procedure(s): LAPAROSCOPIC ASSISTED PARTIAL COLECTOMY (N/A) as a surgical intervention .  The patient's history has been reviewed, patient examined, no change in status, stable for surgery.  I have reviewed the patient's chart and labs.  Questions were answered to the patient's satisfaction.     Keyia Moretto Lenna Sciara

## 2014-01-07 ENCOUNTER — Encounter (HOSPITAL_COMMUNITY): Payer: Self-pay | Admitting: General Surgery

## 2014-01-07 DIAGNOSIS — N189 Chronic kidney disease, unspecified: Secondary | ICD-10-CM

## 2014-01-07 LAB — CBC
HCT: 28.3 % — ABNORMAL LOW (ref 39.0–52.0)
Hemoglobin: 9.1 g/dL — ABNORMAL LOW (ref 13.0–17.0)
MCH: 28.8 pg (ref 26.0–34.0)
MCHC: 32.2 g/dL (ref 30.0–36.0)
MCV: 89.6 fL (ref 78.0–100.0)
PLATELETS: 189 10*3/uL (ref 150–400)
RBC: 3.16 MIL/uL — ABNORMAL LOW (ref 4.22–5.81)
RDW: 15.5 % (ref 11.5–15.5)
WBC: 15.6 10*3/uL — ABNORMAL HIGH (ref 4.0–10.5)

## 2014-01-07 LAB — BASIC METABOLIC PANEL
BUN: 30 mg/dL — AB (ref 6–23)
CALCIUM: 9.1 mg/dL (ref 8.4–10.5)
CO2: 23 mEq/L (ref 19–32)
CREATININE: 1.69 mg/dL — AB (ref 0.50–1.35)
Chloride: 102 mEq/L (ref 96–112)
GFR, EST AFRICAN AMERICAN: 42 mL/min — AB (ref >90)
GFR, EST NON AFRICAN AMERICAN: 37 mL/min — AB (ref >90)
Glucose, Bld: 157 mg/dL — ABNORMAL HIGH (ref 70–99)
Potassium: 4.1 mEq/L (ref 3.7–5.3)
Sodium: 138 mEq/L (ref 137–147)

## 2014-01-07 MED ORDER — BIOTENE DRY MOUTH MT LIQD
15.0000 mL | Freq: Two times a day (BID) | OROMUCOSAL | Status: DC
  Administered 2014-01-07 – 2014-01-09 (×4): 15 mL via OROMUCOSAL

## 2014-01-07 MED ORDER — SODIUM CHLORIDE 0.9 % IV BOLUS (SEPSIS)
250.0000 mL | Freq: Once | INTRAVENOUS | Status: AC
  Administered 2014-01-07: 250 mL via INTRAVENOUS

## 2014-01-07 MED ORDER — CHLORHEXIDINE GLUCONATE 0.12 % MT SOLN
15.0000 mL | Freq: Two times a day (BID) | OROMUCOSAL | Status: DC
  Administered 2014-01-07 – 2014-01-10 (×7): 15 mL via OROMUCOSAL
  Filled 2014-01-07 (×9): qty 15

## 2014-01-07 NOTE — Progress Notes (Signed)
1 Day Post-Op  Subjective: Having some incisional soreness.  No nausea.  Objective: Vital signs in last 24 hours: Temp:  [97 F (36.1 C)-98 F (36.7 C)] 97.6 F (36.4 C) (03/18 0605) Pulse Rate:  [58-94] 94 (03/18 0605) Resp:  [8-21] 18 (03/18 0726) BP: (95-147)/(62-92) 105/67 mmHg (03/18 0605) SpO2:  [95 %-100 %] 98 % (03/18 0726) Weight:  [197 lb (89.359 kg)] 197 lb (89.359 kg) (03/17 0947) Last BM Date: 01/05/14  Intake/Output from previous day: 03/17 0701 - 03/18 0700 In: 3085.4 [I.V.:3085.4] Out: 680 [Urine:480; Blood:200] Intake/Output this shift:    PE: General- In NAD CV-RRR Abdomen-soft, some dried drainage on RUQ dressing, bowel sounds present  Lab Results:   Recent Labs  01/07/14 0433  WBC 15.6*  HGB 9.1*  HCT 28.3*  PLT 189   BMET  Recent Labs  01/07/14 0433  NA 138  K 4.1  CL 102  CO2 23  GLUCOSE 157*  BUN 30*  CREATININE 1.69*  CALCIUM 9.1   PT/INR No results found for this basename: LABPROT, INR,  in the last 72 hours Comprehensive Metabolic Panel:    Component Value Date/Time   NA 138 01/07/2014 0433   NA 141 01/01/2014 1105   K 4.1 01/07/2014 0433   K 3.9 01/01/2014 1105   CL 102 01/07/2014 0433   CL 103 01/01/2014 1105   CO2 23 01/07/2014 0433   CO2 28 01/01/2014 1105   BUN 30* 01/07/2014 0433   BUN 25* 01/01/2014 1105   CREATININE 1.69* 01/07/2014 0433   CREATININE 1.36* 01/01/2014 1105   GLUCOSE 157* 01/07/2014 0433   GLUCOSE 108* 01/01/2014 1105   GLUCOSE 97 08/13/2006 0955   CALCIUM 9.1 01/07/2014 0433   CALCIUM 10.2 01/01/2014 1105   AST 20 01/01/2014 1105   AST 23 11/14/2013 1950   ALT 16 01/01/2014 1105   ALT 16 11/14/2013 1950   ALKPHOS 84 01/01/2014 1105   ALKPHOS 86 11/14/2013 1950   BILITOT 0.4 01/01/2014 1105   BILITOT 0.3 11/14/2013 1950   PROT 6.7 01/01/2014 1105   PROT 6.8 11/14/2013 1950   ALBUMIN 3.8 01/01/2014 1105   ALBUMIN 3.5 11/14/2013 1950     Studies/Results: No results found.  Anti-infectives: Anti-infectives    Start     Dose/Rate Route Frequency Ordered Stop   01/06/14 2000  cefoTEtan (CEFOTAN) 2 g in dextrose 5 % 50 mL IVPB     2 g 100 mL/hr over 30 Minutes Intravenous Every 12 hours 01/06/14 1317 01/06/14 2041   01/06/14 0615  cefoTEtan (CEFOTAN) 1 g in dextrose 5 % 50 mL IVPB     1 g 100 mL/hr over 30 Minutes Intravenous  Once 01/06/14 0602 01/06/14 0745      Assessment Principal Problem:    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 01/06/14-mild hypovolemia Active Problems:   HYPERTENSION   KIDNEY DISEASE, CHRONIC NOS  Acute on chronic anemia    LOS: 1 day   Plan: Start clear liquids.  IVF bolus.  OOB.  Repeat lab tomorrow.   [name] J 01/07/2014

## 2014-01-07 NOTE — Care Management Note (Signed)
    Page 1 of 1   01/07/2014     10:24:45 AM   CARE MANAGEMENT NOTE 01/07/2014  Patient:  [name], [name]   Account Number:  [id number]  Date Initiated:  01/07/2014  Documentation initiated by:  Sunday Spillers  Subjective/Objective Assessment:   80 yo male admitted s/p lap colectomy and lysis of adhesions. PTA lived at home with spouse.     Action/Plan:   Home when stable   Anticipated DC Date:  01/10/2014   Anticipated DC Plan:  [place]  CM consult      Choice offered to / List presented to:             Status of service:  Completed, signed off Medicare Important Message given?   (If response is "NO", the following Medicare IM given date fields will be blank) Date Medicare IM given:   Date Additional Medicare IM given:    Discharge Disposition:  HOME/SELF CARE  Per UR Regulation:  Reviewed for med. necessity/level of care/duration of stay  If discussed at [place] of Stay Meetings, dates discussed:    Comments:

## 2014-01-08 LAB — CBC
HEMATOCRIT: 24.1 % — AB (ref 39.0–52.0)
Hemoglobin: 7.6 g/dL — ABNORMAL LOW (ref 13.0–17.0)
MCH: 28.4 pg (ref 26.0–34.0)
MCHC: 31.5 g/dL (ref 30.0–36.0)
MCV: 89.9 fL (ref 78.0–100.0)
Platelets: 153 10*3/uL (ref 150–400)
RBC: 2.68 MIL/uL — ABNORMAL LOW (ref 4.22–5.81)
RDW: 16 % — ABNORMAL HIGH (ref 11.5–15.5)
WBC: 12.8 10*3/uL — AB (ref 4.0–10.5)

## 2014-01-08 LAB — BASIC METABOLIC PANEL
BUN: 29 mg/dL — ABNORMAL HIGH (ref 6–23)
CHLORIDE: 105 meq/L (ref 96–112)
CO2: 27 mEq/L (ref 19–32)
Calcium: 8.9 mg/dL (ref 8.4–10.5)
Creatinine, Ser: 1.53 mg/dL — ABNORMAL HIGH (ref 0.50–1.35)
GFR, EST AFRICAN AMERICAN: 48 mL/min — AB (ref >90)
GFR, EST NON AFRICAN AMERICAN: 41 mL/min — AB (ref >90)
Glucose, Bld: 112 mg/dL — ABNORMAL HIGH (ref 70–99)
Potassium: 4.4 mEq/L (ref 3.7–5.3)
Sodium: 140 mEq/L (ref 137–147)

## 2014-01-08 NOTE — Progress Notes (Signed)
2 Days Post-Op  Subjective: Pain well controlled.  Voiding.  No BM or flatus.  Tolerating clear liquids.  Objective: Vital signs in last 24 hours: Temp:  [97.7 F (36.5 C)-98.1 F (36.7 C)] 97.7 F (36.5 C) (03/19 0628) Pulse Rate:  [67-82] 67 (03/19 0628) Resp:  [15-18] 16 (03/19 0741) BP: (102-133)/(69-83) 133/83 mmHg (03/19 0628) SpO2:  [96 %-100 %] 96 % (03/19 1128) Last BM Date: 01/05/14  Intake/Output from previous day: 03/18 0701 - 03/19 0700 In: 3889.6 [P.O.:600; I.V.:3039.6; IV Piggyback:250] Out: 1500 [Urine:1500] Intake/Output this shift: Total I/O In: 120 [P.O.:120] Out: 175 [Urine:175]  PE: General- In NAD CV-RRR Abdomen-soft, some dried drainage on RUQ dressing, bowel sounds present  Lab Results:   Recent Labs  01/07/14 0433 01/08/14 0505  WBC 15.6* 12.8*  HGB 9.1* 7.6*  HCT 28.3* 24.1*  PLT 189 153   BMET  Recent Labs  01/07/14 0433 01/08/14 0505  NA 138 140  K 4.1 4.4  CL 102 105  CO2 23 27  GLUCOSE 157* 112*  BUN 30* 29*  CREATININE 1.69* 1.53*  CALCIUM 9.1 8.9   PT/INR No results found for this basename: LABPROT, INR,  in the last 72 hours Comprehensive Metabolic Panel:    Component Value Date/Time   NA 140 01/08/2014 0505   NA 138 01/07/2014 0433   K 4.4 01/08/2014 0505   K 4.1 01/07/2014 0433   CL 105 01/08/2014 0505   CL 102 01/07/2014 0433   CO2 27 01/08/2014 0505   CO2 23 01/07/2014 0433   BUN 29* 01/08/2014 0505   BUN 30* 01/07/2014 0433   CREATININE 1.53* 01/08/2014 0505   CREATININE 1.69* 01/07/2014 0433   GLUCOSE 112* 01/08/2014 0505   GLUCOSE 157* 01/07/2014 0433   GLUCOSE 97 08/13/2006 0955   CALCIUM 8.9 01/08/2014 0505   CALCIUM 9.1 01/07/2014 0433   AST 20 01/01/2014 1105   AST 23 11/14/2013 1950   ALT 16 01/01/2014 1105   ALT 16 11/14/2013 1950   ALKPHOS 84 01/01/2014 1105   ALKPHOS 86 11/14/2013 1950   BILITOT 0.4 01/01/2014 1105   BILITOT 0.3 11/14/2013 1950   PROT 6.7 01/01/2014 1105   PROT 6.8 11/14/2013 1950   ALBUMIN  3.8 01/01/2014 1105   ALBUMIN 3.5 11/14/2013 1950     Studies/Results: No results found.  Anti-infectives: Anti-infectives   Start     Dose/Rate Route Frequency Ordered Stop   01/06/14 2000  cefoTEtan (CEFOTAN) 2 g in dextrose 5 % 50 mL IVPB     2 g 100 mL/hr over 30 Minutes Intravenous Every 12 hours 01/06/14 1317 01/06/14 2041   01/06/14 0615  cefoTEtan (CEFOTAN) 1 g in dextrose 5 % 50 mL IVPB     1 g 100 mL/hr over 30 Minutes Intravenous  Once 01/06/14 0602 01/06/14 0745      Assessment Principal Problem:    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 01/06/14-hypovolemia resolved. Active Problems:   HYPERTENSION   KIDNEY DISEASE, CHRONIC NOS  Acute on chronic anemia-hemoglobin 7.6 this AM.  He is tolerating it well.    LOS: 2 days   Plan:  Low fiber diet.  Decrease IVF.   Repeat lab tomorrow.   Yanette Tripoli J 01/08/2014

## 2014-01-09 LAB — CBC
HCT: 24.5 % — ABNORMAL LOW (ref 39.0–52.0)
HEMOGLOBIN: 8.1 g/dL — AB (ref 13.0–17.0)
MCH: 29 pg (ref 26.0–34.0)
MCHC: 33.1 g/dL (ref 30.0–36.0)
MCV: 87.8 fL (ref 78.0–100.0)
PLATELETS: 176 10*3/uL (ref 150–400)
RBC: 2.79 MIL/uL — AB (ref 4.22–5.81)
RDW: 15.8 % — ABNORMAL HIGH (ref 11.5–15.5)
WBC: 9.2 10*3/uL (ref 4.0–10.5)

## 2014-01-09 LAB — BASIC METABOLIC PANEL
BUN: 19 mg/dL (ref 6–23)
CALCIUM: 9 mg/dL (ref 8.4–10.5)
CHLORIDE: 105 meq/L (ref 96–112)
CO2: 27 meq/L (ref 19–32)
Creatinine, Ser: 1.35 mg/dL (ref 0.50–1.35)
GFR calc Af Amer: 56 mL/min — ABNORMAL LOW (ref >90)
GFR calc non Af Amer: 48 mL/min — ABNORMAL LOW (ref >90)
GLUCOSE: 102 mg/dL — AB (ref 70–99)
Potassium: 3.8 mEq/L (ref 3.7–5.3)
SODIUM: 139 meq/L (ref 137–147)

## 2014-01-09 MED ORDER — HYDROCODONE-ACETAMINOPHEN 5-325 MG PO TABS
1.0000 | ORAL_TABLET | ORAL | Status: DC | PRN
Start: 2014-01-09 — End: 2014-01-10

## 2014-01-09 MED ORDER — HYDROCODONE-ACETAMINOPHEN 5-325 MG PO TABS
1.0000 | ORAL_TABLET | ORAL | Status: DC | PRN
  Administered 2014-01-09: 1 via ORAL
  Filled 2014-01-09: qty 1

## 2014-01-09 MED ORDER — PANTOPRAZOLE SODIUM 40 MG PO TBEC
40.0000 mg | DELAYED_RELEASE_TABLET | Freq: Every day | ORAL | Status: DC
  Administered 2014-01-09: 40 mg via ORAL
  Filled 2014-01-09 (×2): qty 1

## 2014-01-09 NOTE — Progress Notes (Addendum)
Patient is alert and oriented and vital signs are stable. Patient is on a regular diet and tolerates well with no nausea or vomiting. Patient's morphine PCA was discontinued and patient states does not have any pain. Patient's incision site looks clean, dry and intact. Patient has still not have a bowel movement and flatus. Patient ambulated in hallway around nursing station with assistance and patient tolerated it well. Will continue to monitor patient. Marlinda Mike (Student Nurse) 01/09/14. 1549pm Co-signed Jenne Campus RN BSN [facility] 01-09-2014 16:29pm

## 2014-01-09 NOTE — Progress Notes (Signed)
This patient is receiving Protonix. Based on criteria approved by the Pharmacy and Therapeutics Committee, this medication is being converted to the equivalent oral dose form. These criteria include:   . The patient is eating (either orally or per tube) and/or has been taking other orally administered medications for at least 24 hours.  . This patient has no evidence of active gastrointestinal bleeding or impaired GI absorption (gastrectomy, short bowel, patient on TNA or NPO).   If you have questions about this conversion, please contact the pharmacy department.  Romeo Rabon, PharmD, pager [phone]. 01/09/2014,10:40 AM.

## 2014-01-09 NOTE — Discharge Instructions (Signed)
[facility], [place] [phone]  OPEN ABDOMINAL SURGERY: POST OP INSTRUCTIONS  Always review your discharge instruction sheet given to you by the facility where your surgery was performed.  IF YOU HAVE DISABILITY OR FAMILY LEAVE FORMS, YOU MUST BRING THEM TO THE OFFICE FOR PROCESSING.  PLEASE DO NOT GIVE THEM TO YOUR DOCTOR.  1. A prescription for pain medication may be given to you upon discharge.  Take your pain medication as prescribed, if needed.  If narcotic pain medicine is not needed, then you may take acetaminophen (Tylenol) or ibuprofen (Advil) as needed. 2. Take your usually prescribed medications unless otherwise directed. 3. If you need a refill on your pain medication, please contact your pharmacy. They will contact our office to request authorization.  Prescriptions will not be filled after 5pm or on week-ends. 4. You should follow a lowfat diet and drink plenty of fluids. 5. Most patients will experience some swelling and bruising in the area of the incision.It is common to experience some constipation if taking pain medication after surgery.  Increasing fluid intake and taking a stool softener will usually help or prevent this problem from occurring.  A mild laxative (Milk of Magnesia or Miralax) should be taken according to package directions if there are no bowel movements after 48 hours. 6.  You may have steri-strips (small skin tapes) in place directly over the incision.  These strips should be left on the skin for 7-10 days.  If your surgeon used skin glue on the incision, you may shower in 24 hours.  The glue will flake off over the next 2-3 weeks.  Any sutures or staples will be removed at the office during your follow-up visit. You may find that a light gauze bandage over your incision may keep your staples from being rubbed or pulled. You may shower and replace the bandage daily. 7. ACTIVITIES:  You may resume regular (light) daily activities beginning the  next day--such as daily self-care, walking, climbing stairs--gradually increasing activities as tolerated.  You may have sexual intercourse when it is comfortable.  Refrain from any heavy lifting or straining-nothing over 10 pounds for 6 weeks.  a. You may drive when you no longer are taking prescription pain medication, you can comfortably wear a seatbelt, and you can safely maneuver your car and apply brakes b. Return to Work: ___________________________________ 8. You should see your doctor in the office for a follow-up appointment approximately two weeks after your surgery.  Make sure that you call for this appointment within a day or two after you arrive home to insure a convenient appointment time. OTHER INSTRUCTIONS:  _____You may shower.  Apply a dry dressing to the incision on the right side daily.________________________________________________________ _____________________________________________________________  WHEN TO CALL YOUR DOCTOR: 1. Fever over 101.0 2. Inability to urinate 3. Nausea and/or vomiting 4. Extreme swelling or bruising 5. Continued bleeding from incision. 6. Increased pain, redness, or drainage from the incision.  The clinic staff is available to answer your questions during regular business hours.  Please dont hesitate to call and ask to speak to one of the nurses if you have concerns.  For further questions, please visit [email or web address]

## 2014-01-09 NOTE — Progress Notes (Signed)
3 Days Post-Op  Subjective: Tolerated diet yesterday.  Had a BM.  Walking.  Objective: Vital signs in last 24 hours: Temp:  [98.1 F (36.7 C)-98.4 F (36.9 C)] 98.1 F (36.7 C) (03/20 0610) Pulse Rate:  [79-81] 81 (03/20 0610) Resp:  [14-18] 16 (03/20 0610) BP: (110-145)/(68-90) 145/90 mmHg (03/20 0610) SpO2:  [94 %-98 %] 94 % (03/20 0610) Last BM Date: 01/08/14  Intake/Output from previous day: 03/19 0701 - 03/20 0700 In: 2421.7 [P.O.:240; I.V.:2181.7] Out: 1350 [Urine:1350] Intake/Output this shift:    PE: General- In NAD CV-RRR Abdomen-soft, incisions are clean and intact  Lab Results:   Recent Labs  01/08/14 0505 01/09/14 0425  WBC 12.8* 9.2  HGB 7.6* 8.1*  HCT 24.1* 24.5*  PLT 153 176   BMET  Recent Labs  01/08/14 0505 01/09/14 0425  NA 140 139  K 4.4 3.8  CL 105 105  CO2 27 27  GLUCOSE 112* 102*  BUN 29* 19  CREATININE 1.53* 1.35  CALCIUM 8.9 9.0   PT/INR No results found for this basename: LABPROT, INR,  in the last 72 hours Comprehensive Metabolic Panel:    Component Value Date/Time   NA 139 01/09/2014 0425   NA 140 01/08/2014 0505   K 3.8 01/09/2014 0425   K 4.4 01/08/2014 0505   CL 105 01/09/2014 0425   CL 105 01/08/2014 0505   CO2 27 01/09/2014 0425   CO2 27 01/08/2014 0505   BUN 19 01/09/2014 0425   BUN 29* 01/08/2014 0505   CREATININE 1.35 01/09/2014 0425   CREATININE 1.53* 01/08/2014 0505   GLUCOSE 102* 01/09/2014 0425   GLUCOSE 112* 01/08/2014 0505   GLUCOSE 97 08/13/2006 0955   CALCIUM 9.0 01/09/2014 0425   CALCIUM 8.9 01/08/2014 0505   AST 20 01/01/2014 1105   AST 23 11/14/2013 1950   ALT 16 01/01/2014 1105   ALT 16 11/14/2013 1950   ALKPHOS 84 01/01/2014 1105   ALKPHOS 86 11/14/2013 1950   BILITOT 0.4 01/01/2014 1105   BILITOT 0.3 11/14/2013 1950   PROT 6.7 01/01/2014 1105   PROT 6.8 11/14/2013 1950   ALBUMIN 3.8 01/01/2014 1105   ALBUMIN 3.5 11/14/2013 1950     Studies/Results:   Pathology-Stage 1 adenocarcinoma; this was discussed  with him.  Anti-infectives: Anti-infectives   Start     Dose/Rate Route Frequency Ordered Stop   01/06/14 2000  cefoTEtan (CEFOTAN) 2 g in dextrose 5 % 50 mL IVPB     2 g 100 mL/hr over 30 Minutes Intravenous Every 12 hours 01/06/14 1317 01/06/14 2041   01/06/14 0615  cefoTEtan (CEFOTAN) 1 g in dextrose 5 % 50 mL IVPB     1 g 100 mL/hr over 30 Minutes Intravenous  Once 01/06/14 0602 01/06/14 0745      Assessment Principal Problem:    Stage 1 cecal cancer s/p lap assisted right colectomy 01/06/14-progressing well   HYPERTENSION   KIDNEY DISEASE, CHRONIC NOS-renal function improved.  Acute on chronic anemia-hemoglobin up to 8.1 this AM.      LOS: 3 days   Plan:  D/C PCA. Heplock IV.  Oral analgesic.  Hopefully home tomorrow.  Discharge instructions discussed with him.   Griffen Frayne J 01/09/2014

## 2014-01-10 MED ORDER — OXYCODONE-ACETAMINOPHEN 5-325 MG PO TABS: 1.0000 | ORAL_TABLET | ORAL | Status: DC | PRN

## 2014-01-10 MED ORDER — ONDANSETRON HCL 4 MG PO TABS: 4.0000 mg | ORAL_TABLET | Freq: Four times a day (QID) | ORAL | Status: DC | PRN

## 2014-01-10 NOTE — Progress Notes (Signed)
4 Days Post-Op  Subjective: Pt tolerating diet.. norco makes him feel sick  Wants something else.  Bowels moving.    Objective: Vital signs in last 24 hours: Temp:  [97.9 F (36.6 C)-98.1 F (36.7 C)] 98.1 F (36.7 C) (03/21 0556) Pulse Rate:  [81-96] 81 (03/21 0803) Resp:  [18] 18 (03/21 0556) BP: (116-144)/(79-89) 144/84 mmHg (03/21 0803) SpO2:  [95 %-98 %] 95 % (03/21 0803) Last BM Date: 01/09/14 (per pt)  Intake/Output from previous day: 03/20 0701 - 03/21 0700 In: 860 [P.O.:260; I.V.:600] Out: 750 [Urine:750] Intake/Output this shift:    Incision/Wound:RUQ incision clean. Soft  Abdomen non tender bowel sound present. Other port sites clean.    Lab Results:   Recent Labs  01/08/14 0505 01/09/14 0425  WBC 12.8* 9.2  HGB 7.6* 8.1*  HCT 24.1* 24.5*  PLT 153 176   BMET  Recent Labs  01/08/14 0505 01/09/14 0425  NA 140 139  K 4.4 3.8  CL 105 105  CO2 27 27  GLUCOSE 112* 102*  BUN 29* 19  CREATININE 1.53* 1.35  CALCIUM 8.9 9.0   PT/INR No results found for this basename: LABPROT, INR,  in the last 72 hours ABG No results found for this basename: PHART, PCO2, PO2, HCO3,  in the last 72 hours  Studies/Results: No results found.  Anti-infectives: Anti-infectives   Start     Dose/Rate Route Frequency Ordered Stop   01/06/14 2000  cefoTEtan (CEFOTAN) 2 g in dextrose 5 % 50 mL IVPB     2 g 100 mL/hr over 30 Minutes Intravenous Every 12 hours 01/06/14 1317 01/06/14 2041   01/06/14 0615  cefoTEtan (CEFOTAN) 1 g in dextrose 5 % 50 mL IVPB     1 g 100 mL/hr over 30 Minutes Intravenous  Once 01/06/14 0602 01/06/14 0745      Assessment/Plan: s/p Procedure(s): LAPAROSCOPIC ASSISTED PARTIAL COLECTOMY (N/A) LAPAROSCOPIC LYSIS OF ADHESIONS (N/A) Discharge  LOS: 4 days    [name] Diliberto A. 01/10/2014

## 2014-01-10 NOTE — Progress Notes (Signed)
Discharge instructions reviewed with patient and wife using teach back method. No questions at this time patient discharged to home

## 2014-01-13 NOTE — Addendum Note (Signed)
Addendum created 01/13/14 1029 by Myrtie Soman, MD   Modules edited: Anesthesia Attestations

## 2014-01-15 NOTE — Discharge Summary (Signed)
Physician Discharge Summary  Patient ID: [name] MRN: [id number] DOB/AGE: May 28, 1931 80 y.o.  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 He progressed well postoperatively and was able discharged on his fourth postoperative day. Discharge instructions were given to him. He will follow up in the office for staple removal.  His pathology was discussed with him.  Consults: None  Significant Diagnostic Studies: none  Treatments: surgery: Laparoscopic-assisted right colectomy  Discharge Exam: Blood pressure 144/84, pulse 81, temperature 98.1 F (36.7 C), temperature source Oral, resp. rate 18, height 5\' 10"  (1.778 m), weight 197 lb (89.359 kg), SpO2 95.00%.   Disposition: 01-Home or Self Care  Discharge Orders   Future Orders Complete By Expires   Diet - low sodium heart healthy  As directed    Diet - low sodium heart healthy  As directed    Increase activity slowly  As directed    Increase activity slowly  As directed        Medication List         ACZONE 5 % topical gel  Generic drug:  Dapsone  Apply 1 application topically 2 (two) times daily.     allopurinol 300 MG tablet  Commonly known as:  ZYLOPRIM  Take 300 mg by mouth daily.     carvedilol 6.25 MG tablet  Commonly known as:  COREG  Take 6.25 mg by mouth 2 (two) times daily with a meal.     hydrochlorothiazide 50 MG tablet  Commonly known as:  HYDRODIURIL  Take 50 mg by mouth every morning.     multivitamin with minerals Tabs tablet  Take 1 tablet by mouth daily.     ondansetron 4 MG tablet  Commonly known as:  ZOFRAN  Take 1 tablet (4 mg  total) by mouth every 6 (six) hours as needed for nausea.     oxyCODONE-acetaminophen 5-325 MG per tablet  Commonly known as:  PERCOCET/ROXICET  Take 1 tablet by mouth every 4 (four) hours as needed for moderate pain.     potassium chloride SA 20 MEQ tablet  Commonly known as:  K-DUR,KLOR-CON  Take 20 mEq by mouth daily.         Signed: Odis Hollingshead 01/15/2014, 8:01 AM

## 2014-01-19 ENCOUNTER — Ambulatory Visit (INDEPENDENT_AMBULATORY_CARE_PROVIDER_SITE_OTHER): Payer: Medicare Other

## 2014-01-19 DIAGNOSIS — Z4802 Encounter for removal of sutures: Secondary | ICD-10-CM

## 2014-01-19 NOTE — Progress Notes (Signed)
Pt is s/p laparoscopic partial colectomy on 01/06/14 by Dr. Zella Richer.  He is here today for staple removal.  Pt is doing well but does report that he is fatigued and has minimal appetite.  He was assured this was normal and would take some time to improve.  His incisions are almost completely healed.  Staples were removed and steri strips placed over the incisions.  All pt's questions were answered.  He will return for his post op appointment on 02/04/14.

## 2014-01-27 DIAGNOSIS — Z6825 Body mass index (BMI) 25.0-25.9, adult: Secondary | ICD-10-CM | POA: Diagnosis not present

## 2014-01-27 DIAGNOSIS — I4460 Unspecified fascicular block: Secondary | ICD-10-CM | POA: Diagnosis not present

## 2014-01-27 DIAGNOSIS — R197 Diarrhea, unspecified: Secondary | ICD-10-CM | POA: Diagnosis not present

## 2014-01-27 DIAGNOSIS — N183 Chronic kidney disease, stage 3 unspecified: Secondary | ICD-10-CM | POA: Diagnosis not present

## 2014-01-27 DIAGNOSIS — I10 Essential (primary) hypertension: Secondary | ICD-10-CM | POA: Diagnosis not present

## 2014-01-27 DIAGNOSIS — R071 Chest pain on breathing: Secondary | ICD-10-CM | POA: Diagnosis not present

## 2014-01-27 DIAGNOSIS — J309 Allergic rhinitis, unspecified: Secondary | ICD-10-CM | POA: Diagnosis not present

## 2014-02-04 ENCOUNTER — Encounter (INDEPENDENT_AMBULATORY_CARE_PROVIDER_SITE_OTHER): Payer: Self-pay | Admitting: General Surgery

## 2014-02-04 ENCOUNTER — Ambulatory Visit (INDEPENDENT_AMBULATORY_CARE_PROVIDER_SITE_OTHER): Payer: Medicare Other | Admitting: General Surgery

## 2014-02-04 VITALS — BP 122/80 | HR 76 | Temp 98.5°F | Resp 16 | Ht 70.0 in | Wt 185.0 lb

## 2014-02-04 DIAGNOSIS — Z4889 Encounter for other specified surgical aftercare: Secondary | ICD-10-CM

## 2014-02-04 NOTE — Progress Notes (Signed)
Procedure:  Laparoscopic-assisted right colectomy  Date:  01/06/2014  Pathology:  Stage I cancer of the cecum  History:  He is here for his first postoperative visit. His energy level is slowly returning as is his appetite. He is having 2-3 loose bowel movements a day. No pain.  Exam: General- Is in NAD. Abdomen-soft, incisions are clean, intact, and solid.  Assessment:  Status post laparoscopic-assisted right colectomy for stage I cecal cancer-progressing well. Did have some acute on chronic anemia after surgery. He is taking a multivitamin with iron.  Plan:  Continue light activities for now. Return visit in 3 weeks.

## 2014-02-04 NOTE — Patient Instructions (Signed)
Continue light activities.

## 2014-02-25 ENCOUNTER — Encounter (INDEPENDENT_AMBULATORY_CARE_PROVIDER_SITE_OTHER): Payer: Self-pay | Admitting: General Surgery

## 2014-02-25 ENCOUNTER — Ambulatory Visit (INDEPENDENT_AMBULATORY_CARE_PROVIDER_SITE_OTHER): Payer: Medicare Other | Admitting: General Surgery

## 2014-02-25 VITALS — BP 110/70 | HR 76 | Resp 14 | Ht 70.0 in | Wt 184.8 lb

## 2014-02-25 DIAGNOSIS — Z4889 Encounter for other specified surgical aftercare: Secondary | ICD-10-CM

## 2014-02-25 NOTE — Patient Instructions (Signed)
May resume normal activities as tolerated, as discussed.  

## 2014-02-25 NOTE — Progress Notes (Signed)
Procedure:  Laparoscopic-assisted right colectomy  Date:  01/06/2014  Pathology:  Stage I cancer of the cecum  History:  He is here for his second postoperative visit.  He is feeling better. He is eating well. Bowels are moving. Energy level continues to improve. Exam: General- Is in NAD. Abdomen-soft, incisions are clean, intact, and solid.  Assessment:  Status post laparoscopic-assisted right colectomy for stage I cecal cancer-He continues to do well postoperatively. His Coumadin is still being held.  Plan: May start resuming normal activities as tolerated. We discussed what this means. Return visit in 6 months. We'll check CEA at that time. I recommended a colonoscopy one year after his surgery date. If Dr. Osborne Casco and Dr. Watt Climes feel it is okay to restart his Coumadin, it is okay from my standpoint.

## 2014-03-18 DIAGNOSIS — C4441 Basal cell carcinoma of skin of scalp and neck: Secondary | ICD-10-CM | POA: Diagnosis not present

## 2014-03-18 DIAGNOSIS — L738 Other specified follicular disorders: Secondary | ICD-10-CM | POA: Diagnosis not present

## 2014-03-18 DIAGNOSIS — L708 Other acne: Secondary | ICD-10-CM | POA: Diagnosis not present

## 2014-03-18 DIAGNOSIS — L570 Actinic keratosis: Secondary | ICD-10-CM | POA: Diagnosis not present

## 2014-04-21 DIAGNOSIS — L738 Other specified follicular disorders: Secondary | ICD-10-CM | POA: Diagnosis not present

## 2014-04-21 DIAGNOSIS — C4441 Basal cell carcinoma of skin of scalp and neck: Secondary | ICD-10-CM | POA: Diagnosis not present

## 2014-04-21 DIAGNOSIS — Z85828 Personal history of other malignant neoplasm of skin: Secondary | ICD-10-CM | POA: Diagnosis not present

## 2014-04-21 DIAGNOSIS — L659 Nonscarring hair loss, unspecified: Secondary | ICD-10-CM | POA: Diagnosis not present

## 2014-04-21 DIAGNOSIS — L678 Other hair color and hair shaft abnormalities: Secondary | ICD-10-CM | POA: Diagnosis not present

## 2014-05-15 DIAGNOSIS — N183 Chronic kidney disease, stage 3 unspecified: Secondary | ICD-10-CM | POA: Diagnosis not present

## 2014-05-15 DIAGNOSIS — M25559 Pain in unspecified hip: Secondary | ICD-10-CM | POA: Diagnosis not present

## 2014-05-15 DIAGNOSIS — I10 Essential (primary) hypertension: Secondary | ICD-10-CM | POA: Diagnosis not present

## 2014-05-15 DIAGNOSIS — Z6826 Body mass index (BMI) 26.0-26.9, adult: Secondary | ICD-10-CM | POA: Diagnosis not present

## 2014-06-02 DIAGNOSIS — Z85828 Personal history of other malignant neoplasm of skin: Secondary | ICD-10-CM | POA: Diagnosis not present

## 2014-06-02 DIAGNOSIS — C4441 Basal cell carcinoma of skin of scalp and neck: Secondary | ICD-10-CM | POA: Diagnosis not present

## 2014-06-02 DIAGNOSIS — L570 Actinic keratosis: Secondary | ICD-10-CM | POA: Diagnosis not present

## 2014-06-09 DIAGNOSIS — B356 Tinea cruris: Secondary | ICD-10-CM | POA: Diagnosis not present

## 2014-06-09 DIAGNOSIS — J309 Allergic rhinitis, unspecified: Secondary | ICD-10-CM | POA: Diagnosis not present

## 2014-06-09 DIAGNOSIS — I10 Essential (primary) hypertension: Secondary | ICD-10-CM | POA: Diagnosis not present

## 2014-06-09 DIAGNOSIS — M5430 Sciatica, unspecified side: Secondary | ICD-10-CM | POA: Diagnosis not present

## 2014-06-09 DIAGNOSIS — N183 Chronic kidney disease, stage 3 unspecified: Secondary | ICD-10-CM | POA: Diagnosis not present

## 2014-06-09 DIAGNOSIS — I2699 Other pulmonary embolism without acute cor pulmonale: Secondary | ICD-10-CM | POA: Diagnosis not present

## 2014-06-09 DIAGNOSIS — M109 Gout, unspecified: Secondary | ICD-10-CM | POA: Diagnosis not present

## 2014-06-24 DIAGNOSIS — L259 Unspecified contact dermatitis, unspecified cause: Secondary | ICD-10-CM | POA: Diagnosis not present

## 2014-09-04 DIAGNOSIS — C44310 Basal cell carcinoma of skin of unspecified parts of face: Secondary | ICD-10-CM | POA: Diagnosis not present

## 2014-09-04 DIAGNOSIS — Z85828 Personal history of other malignant neoplasm of skin: Secondary | ICD-10-CM | POA: Diagnosis not present

## 2014-09-04 DIAGNOSIS — D044 Carcinoma in situ of skin of scalp and neck: Secondary | ICD-10-CM | POA: Diagnosis not present

## 2014-09-04 DIAGNOSIS — L570 Actinic keratosis: Secondary | ICD-10-CM | POA: Diagnosis not present

## 2014-09-04 DIAGNOSIS — X32XXXD Exposure to sunlight, subsequent encounter: Secondary | ICD-10-CM | POA: Diagnosis not present

## 2014-09-04 DIAGNOSIS — L219 Seborrheic dermatitis, unspecified: Secondary | ICD-10-CM | POA: Diagnosis not present

## 2014-09-04 DIAGNOSIS — Z08 Encounter for follow-up examination after completed treatment for malignant neoplasm: Secondary | ICD-10-CM | POA: Diagnosis not present

## 2014-10-20 ENCOUNTER — Other Ambulatory Visit: Payer: Self-pay | Admitting: Internal Medicine

## 2014-10-20 DIAGNOSIS — R1031 Right lower quadrant pain: Secondary | ICD-10-CM

## 2014-10-20 DIAGNOSIS — R1011 Right upper quadrant pain: Secondary | ICD-10-CM | POA: Diagnosis not present

## 2014-10-20 DIAGNOSIS — M47819 Spondylosis without myelopathy or radiculopathy, site unspecified: Secondary | ICD-10-CM | POA: Diagnosis not present

## 2014-10-20 DIAGNOSIS — Z6825 Body mass index (BMI) 25.0-25.9, adult: Secondary | ICD-10-CM | POA: Diagnosis not present

## 2014-10-22 ENCOUNTER — Ambulatory Visit
Admission: RE | Admit: 2014-10-22 | Discharge: 2014-10-22 | Disposition: A | Discharge status: Home / Self Care | Payer: Medicare Other | Source: Ambulatory Visit | Attending: Internal Medicine | Admitting: Internal Medicine

## 2014-10-22 DIAGNOSIS — Z85038 Personal history of other malignant neoplasm of large intestine: Secondary | ICD-10-CM | POA: Diagnosis not present

## 2014-10-22 DIAGNOSIS — R1031 Right lower quadrant pain: Secondary | ICD-10-CM

## 2014-10-22 DIAGNOSIS — N281 Cyst of kidney, acquired: Secondary | ICD-10-CM | POA: Diagnosis not present

## 2014-10-22 DIAGNOSIS — Z9049 Acquired absence of other specified parts of digestive tract: Secondary | ICD-10-CM | POA: Diagnosis not present

## 2014-10-30 DIAGNOSIS — C4441 Basal cell carcinoma of skin of scalp and neck: Secondary | ICD-10-CM | POA: Diagnosis not present

## 2014-10-30 DIAGNOSIS — X32XXXA Exposure to sunlight, initial encounter: Secondary | ICD-10-CM | POA: Diagnosis not present

## 2014-10-30 DIAGNOSIS — L718 Other rosacea: Secondary | ICD-10-CM | POA: Diagnosis not present

## 2014-10-30 DIAGNOSIS — Z08 Encounter for follow-up examination after completed treatment for malignant neoplasm: Secondary | ICD-10-CM | POA: Diagnosis not present

## 2014-10-30 DIAGNOSIS — Z85828 Personal history of other malignant neoplasm of skin: Secondary | ICD-10-CM | POA: Diagnosis not present

## 2014-10-30 DIAGNOSIS — L219 Seborrheic dermatitis, unspecified: Secondary | ICD-10-CM | POA: Diagnosis not present

## 2014-10-30 DIAGNOSIS — L570 Actinic keratosis: Secondary | ICD-10-CM | POA: Diagnosis not present

## 2014-11-27 DIAGNOSIS — L219 Seborrheic dermatitis, unspecified: Secondary | ICD-10-CM | POA: Diagnosis not present

## 2014-11-27 DIAGNOSIS — Z85828 Personal history of other malignant neoplasm of skin: Secondary | ICD-10-CM | POA: Diagnosis not present

## 2014-11-27 DIAGNOSIS — Z08 Encounter for follow-up examination after completed treatment for malignant neoplasm: Secondary | ICD-10-CM | POA: Diagnosis not present

## 2014-11-27 DIAGNOSIS — X32XXXD Exposure to sunlight, subsequent encounter: Secondary | ICD-10-CM | POA: Diagnosis not present

## 2014-11-27 DIAGNOSIS — L570 Actinic keratosis: Secondary | ICD-10-CM | POA: Diagnosis not present

## 2014-11-27 DIAGNOSIS — L820 Inflamed seborrheic keratosis: Secondary | ICD-10-CM | POA: Diagnosis not present

## 2014-12-09 DIAGNOSIS — Z125 Encounter for screening for malignant neoplasm of prostate: Secondary | ICD-10-CM | POA: Diagnosis not present

## 2014-12-09 DIAGNOSIS — I10 Essential (primary) hypertension: Secondary | ICD-10-CM | POA: Diagnosis not present

## 2014-12-09 DIAGNOSIS — M109 Gout, unspecified: Secondary | ICD-10-CM | POA: Diagnosis not present

## 2014-12-16 DIAGNOSIS — Z0000 Encounter for general adult medical examination without abnormal findings: Secondary | ICD-10-CM | POA: Diagnosis not present

## 2014-12-16 DIAGNOSIS — M1990 Unspecified osteoarthritis, unspecified site: Secondary | ICD-10-CM | POA: Diagnosis not present

## 2014-12-16 DIAGNOSIS — R809 Proteinuria, unspecified: Secondary | ICD-10-CM | POA: Diagnosis not present

## 2014-12-16 DIAGNOSIS — Z8546 Personal history of malignant neoplasm of prostate: Secondary | ICD-10-CM | POA: Diagnosis not present

## 2014-12-16 DIAGNOSIS — I10 Essential (primary) hypertension: Secondary | ICD-10-CM | POA: Diagnosis not present

## 2014-12-16 DIAGNOSIS — I1310 Hypertensive heart and chronic kidney disease without heart failure, with stage 1 through stage 4 chronic kidney disease, or unspecified chronic kidney disease: Secondary | ICD-10-CM | POA: Diagnosis not present

## 2014-12-16 DIAGNOSIS — M109 Gout, unspecified: Secondary | ICD-10-CM | POA: Diagnosis not present

## 2014-12-16 DIAGNOSIS — Z1389 Encounter for screening for other disorder: Secondary | ICD-10-CM | POA: Diagnosis not present

## 2014-12-16 DIAGNOSIS — Z6825 Body mass index (BMI) 25.0-25.9, adult: Secondary | ICD-10-CM | POA: Diagnosis not present

## 2014-12-16 DIAGNOSIS — I2699 Other pulmonary embolism without acute cor pulmonale: Secondary | ICD-10-CM | POA: Diagnosis not present

## 2014-12-16 DIAGNOSIS — I444 Left anterior fascicular block: Secondary | ICD-10-CM | POA: Diagnosis not present

## 2014-12-24 DIAGNOSIS — Z85038 Personal history of other malignant neoplasm of large intestine: Secondary | ICD-10-CM | POA: Diagnosis not present

## 2014-12-25 DIAGNOSIS — Z85038 Personal history of other malignant neoplasm of large intestine: Secondary | ICD-10-CM | POA: Diagnosis not present

## 2014-12-31 ENCOUNTER — Emergency Department (HOSPITAL_COMMUNITY)
Admission: EM | Admit: 2014-12-31 | Discharge: 2014-12-31 | Discharge status: Against Medical Advice | Payer: Medicare Other | Attending: Emergency Medicine | Admitting: Emergency Medicine

## 2014-12-31 ENCOUNTER — Encounter (HOSPITAL_COMMUNITY): Payer: Self-pay | Admitting: Emergency Medicine

## 2014-12-31 DIAGNOSIS — I10 Essential (primary) hypertension: Secondary | ICD-10-CM | POA: Diagnosis not present

## 2014-12-31 DIAGNOSIS — K625 Hemorrhage of anus and rectum: Secondary | ICD-10-CM | POA: Diagnosis not present

## 2014-12-31 HISTORY — DX: Unspecified hemorrhoids: K64.9

## 2014-12-31 NOTE — ED Notes (Signed)
Wife inquiring how much longer the wait would be  Ensured her we are doing all we can to get to him as quickly as possible  States the pt is feeling better and they will go see his PCP tomorrow morning  Encouraged to stay but they declined

## 2014-12-31 NOTE — ED Notes (Signed)
Pt states he noticed blood on the tissue after he showered tonight and states he had 2 bowel movements since then with blood on the tissue when he wiped  Pt states there was no blood in the toilet with the stool and states the stools were brown in color  Pt has hx of hemorrhoids

## 2015-01-01 DIAGNOSIS — Z6825 Body mass index (BMI) 25.0-25.9, adult: Secondary | ICD-10-CM | POA: Diagnosis not present

## 2015-01-01 DIAGNOSIS — K921 Melena: Secondary | ICD-10-CM | POA: Diagnosis not present

## 2015-01-01 DIAGNOSIS — K649 Unspecified hemorrhoids: Secondary | ICD-10-CM | POA: Diagnosis not present

## 2015-01-15 ENCOUNTER — Emergency Department (HOSPITAL_COMMUNITY)
Admission: EM | Admit: 2015-01-15 | Discharge: 2015-01-15 | Disposition: A | Discharge status: Home / Self Care | Payer: Medicare Other | Attending: Emergency Medicine | Admitting: Emergency Medicine

## 2015-01-15 ENCOUNTER — Encounter (HOSPITAL_COMMUNITY): Payer: Self-pay | Admitting: Emergency Medicine

## 2015-01-15 DIAGNOSIS — Z79899 Other long term (current) drug therapy: Secondary | ICD-10-CM | POA: Insufficient documentation

## 2015-01-15 DIAGNOSIS — Z792 Long term (current) use of antibiotics: Secondary | ICD-10-CM | POA: Diagnosis not present

## 2015-01-15 DIAGNOSIS — M109 Gout, unspecified: Secondary | ICD-10-CM | POA: Diagnosis not present

## 2015-01-15 DIAGNOSIS — Z7952 Long term (current) use of systemic steroids: Secondary | ICD-10-CM | POA: Insufficient documentation

## 2015-01-15 DIAGNOSIS — Z86718 Personal history of other venous thrombosis and embolism: Secondary | ICD-10-CM | POA: Insufficient documentation

## 2015-01-15 DIAGNOSIS — Z8719 Personal history of other diseases of the digestive system: Secondary | ICD-10-CM | POA: Diagnosis not present

## 2015-01-15 DIAGNOSIS — Z8546 Personal history of malignant neoplasm of prostate: Secondary | ICD-10-CM | POA: Diagnosis not present

## 2015-01-15 DIAGNOSIS — R197 Diarrhea, unspecified: Secondary | ICD-10-CM | POA: Diagnosis not present

## 2015-01-15 DIAGNOSIS — R5383 Other fatigue: Secondary | ICD-10-CM | POA: Diagnosis not present

## 2015-01-15 DIAGNOSIS — I10 Essential (primary) hypertension: Secondary | ICD-10-CM | POA: Diagnosis not present

## 2015-01-15 DIAGNOSIS — N289 Disorder of kidney and ureter, unspecified: Secondary | ICD-10-CM | POA: Diagnosis not present

## 2015-01-15 DIAGNOSIS — Z85038 Personal history of other malignant neoplasm of large intestine: Secondary | ICD-10-CM | POA: Insufficient documentation

## 2015-01-15 DIAGNOSIS — Z87891 Personal history of nicotine dependence: Secondary | ICD-10-CM | POA: Diagnosis not present

## 2015-01-15 DIAGNOSIS — R195 Other fecal abnormalities: Secondary | ICD-10-CM | POA: Diagnosis not present

## 2015-01-15 DIAGNOSIS — N2889 Other specified disorders of kidney and ureter: Secondary | ICD-10-CM | POA: Diagnosis not present

## 2015-01-15 DIAGNOSIS — K625 Hemorrhage of anus and rectum: Secondary | ICD-10-CM | POA: Diagnosis present

## 2015-01-15 LAB — CBC WITH DIFFERENTIAL/PLATELET
Basophils Absolute: 0 10*3/uL (ref 0.0–0.1)
Basophils Relative: 0 % (ref 0–1)
Eosinophils Absolute: 0.1 10*3/uL (ref 0.0–0.7)
Eosinophils Relative: 1 % (ref 0–5)
HEMATOCRIT: 41.3 % (ref 39.0–52.0)
HEMOGLOBIN: 14.1 g/dL (ref 13.0–17.0)
Lymphocytes Relative: 8 % — ABNORMAL LOW (ref 12–46)
Lymphs Abs: 0.8 10*3/uL (ref 0.7–4.0)
MCH: 32.2 pg (ref 26.0–34.0)
MCHC: 34.1 g/dL (ref 30.0–36.0)
MCV: 94.3 fL (ref 78.0–100.0)
MONO ABS: 0.5 10*3/uL (ref 0.1–1.0)
MONOS PCT: 6 % (ref 3–12)
NEUTROS ABS: 8.3 10*3/uL — AB (ref 1.7–7.7)
NEUTROS PCT: 85 % — AB (ref 43–77)
Platelets: 128 10*3/uL — ABNORMAL LOW (ref 150–400)
RBC: 4.38 MIL/uL (ref 4.22–5.81)
RDW: 15.5 % (ref 11.5–15.5)
WBC: 9.8 10*3/uL (ref 4.0–10.5)

## 2015-01-15 LAB — COMPREHENSIVE METABOLIC PANEL
ALK PHOS: 68 U/L (ref 39–117)
ALT: 24 U/L (ref 0–53)
ANION GAP: 9 (ref 5–15)
AST: 38 U/L — ABNORMAL HIGH (ref 0–37)
Albumin: 3.9 g/dL (ref 3.5–5.2)
BILIRUBIN TOTAL: 0.6 mg/dL (ref 0.3–1.2)
BUN: 38 mg/dL — ABNORMAL HIGH (ref 6–23)
CHLORIDE: 106 mmol/L (ref 96–112)
CO2: 27 mmol/L (ref 19–32)
Calcium: 9.7 mg/dL (ref 8.4–10.5)
Creatinine, Ser: 2.25 mg/dL — ABNORMAL HIGH (ref 0.50–1.35)
GFR calc Af Amer: 30 mL/min — ABNORMAL LOW (ref >90)
GFR calc non Af Amer: 26 mL/min — ABNORMAL LOW (ref >90)
GLUCOSE: 103 mg/dL — AB (ref 70–99)
Potassium: 4.2 mmol/L (ref 3.5–5.1)
Sodium: 142 mmol/L (ref 135–145)
Total Protein: 7.1 g/dL (ref 6.0–8.3)

## 2015-01-15 LAB — URINALYSIS, ROUTINE W REFLEX MICROSCOPIC
Glucose, UA: NEGATIVE mg/dL
Hgb urine dipstick: NEGATIVE
Ketones, ur: NEGATIVE mg/dL
LEUKOCYTES UA: NEGATIVE
NITRITE: NEGATIVE
PROTEIN: NEGATIVE mg/dL
Specific Gravity, Urine: 1.027 (ref 1.005–1.030)
Urobilinogen, UA: 0.2 mg/dL (ref 0.0–1.0)
pH: 5 (ref 5.0–8.0)

## 2015-01-15 LAB — LIPASE, BLOOD: Lipase: 19 U/L (ref 11–59)

## 2015-01-15 LAB — POC OCCULT BLOOD, ED: Fecal Occult Bld: NEGATIVE

## 2015-01-15 MED ORDER — SODIUM CHLORIDE 0.9 % IV BOLUS (SEPSIS)
1000.0000 mL | Freq: Once | INTRAVENOUS | Status: AC
  Administered 2015-01-15: 1000 mL via INTRAVENOUS

## 2015-01-15 NOTE — ED Provider Notes (Signed)
CSN: [id number]     Arrival date & time 01/15/15  1300 History   First MD Initiated Contact with Patient 01/15/15 1503     Chief Complaint  Patient presents with  . Rectal Bleeding     (Consider location/radiation/quality/duration/timing/severity/associated sxs/prior Treatment) Patient is a 81 y.o. male presenting with hematochezia. The history is provided by the patient.  Rectal Bleeding Associated symptoms: no abdominal pain and no vomiting    patient presents with possible GI bleed. States he's had black stool. States she has had the flu over the last few days. Fevers chills and diarrhea. Slight nausea but no vomiting. No dysuria. No lightheadedness. He is not on anticoagulation. Previous abdominal surgeries for cancer and diverticulitis. No bleeding from other sites. States he always bruises easily but no more than normal now.  Past Medical History  Diagnosis Date  . Hypertension   . Incontinence   . Diverticulitis   . Adenocarcinoma of colon   . Prostate carcinoma   . Peripheral vascular disease 2011    DVT right leg  . History of blood transfusion   . Gout     no recent flare  . Hemorrhoids    Past Surgical History  Procedure Laterality Date  . Hernia repair  1999  . Prostate removed  1992  . Colonoscopy N/A 11/17/2013    Procedure: COLONOSCOPY;  Surgeon: Jeryl Columbia, MD;  Location: [facility];  Service: Endoscopy;  Laterality: N/A;  . Hot hemostasis N/A 11/17/2013    Procedure: HOT HEMOSTASIS (ARGON PLASMA COAGULATION/BICAP);  Surgeon: Jeryl Columbia, MD;  Location: [facility];  Service: Endoscopy;  Laterality: N/A;  . Ileostomy    . Colon surgery    . Eye surgery Bilateral     cataract extraction with IOL  . Tonsillectomy    . Laparoscopic partial colectomy N/A 01/06/2014    Procedure: LAPAROSCOPIC ASSISTED PARTIAL COLECTOMY;  Surgeon: Odis Hollingshead, MD;  Location: [facility];  Service: General;  Laterality: N/A;  . Laparoscopic lysis of adhesions N/A 01/06/2014   Procedure: LAPAROSCOPIC LYSIS OF ADHESIONS;  Surgeon: Odis Hollingshead, MD;  Location: [facility];  Service: General;  Laterality: N/A;   No family history on file. History  Substance Use Topics  . Smoking status: Former Smoker    Types: Cigarettes, Pipe    Quit date: 01/02/1980  . Smokeless tobacco: Never Used  . Alcohol Use: No    Review of Systems  Constitutional: Positive for fatigue. Negative for activity change and appetite change.  Eyes: Negative for pain.  Respiratory: Negative for chest tightness and shortness of breath.   Cardiovascular: Negative for chest pain and leg swelling.  Gastrointestinal: Positive for blood in stool and hematochezia. Negative for nausea, vomiting, abdominal pain and diarrhea.  Genitourinary: Negative for flank pain.  Musculoskeletal: Negative for back pain and neck stiffness.  Skin: Negative for rash.  Neurological: Negative for weakness, numbness and headaches.  Psychiatric/Behavioral: Negative for behavioral problems.      Allergies  Sulfonamide derivatives  Home Medications   Prior to Admission medications   Medication Sig Start Date End Date Taking? Authorizing Provider  allopurinol (ZYLOPRIM) 300 MG tablet Take 300 mg by mouth daily.     Yes Historical Provider, MD  betamethasone, augmented, (DIPROLENE) 0.05 % lotion Apply topically 2 (two) times daily.   Yes Historical Provider, MD  Calcium Carbonate-Vit D-Min (CALCIUM 1200 PO) Take 1 tablet by mouth daily.   Yes Historical Provider, MD  doxycycline (VIBRAMYCIN) 50 MG capsule Take  50 mg by mouth daily. 12/23/14  Yes Historical Provider, MD  hydrochlorothiazide (HYDRODIURIL) 50 MG tablet Take 50 mg by mouth every morning.    Yes Historical Provider, MD  Multiple Vitamin (MULTIVITAMIN WITH MINERALS) TABS tablet Take 1 tablet by mouth daily.   Yes Historical Provider, MD  mupirocin cream (BACTROBAN) 2 % Apply 1 application topically 3 (three) times daily as needed (breakouts in groin area.).    Yes Historical Provider, MD  potassium chloride SA (K-DUR,KLOR-CON) 20 MEQ tablet Take 20 mEq by mouth daily.   Yes Historical Provider, MD  carvedilol (COREG) 6.25 MG tablet Take 6.25 mg by mouth 2 (two) times daily with a meal.      Historical Provider, MD  clobetasol cream (TEMOVATE) 5.36 % Apply 1 application topically as directed. 11/18/14   Historical Provider, MD  Clobetasol Propionate 0.05 % lotion Apply 1 application topically as directed. 11/02/14   Historical Provider, MD  Dapsone (ACZONE) 5 % topical gel Apply 1 application topically 2 (two) times daily.    Historical Provider, MD   BP 117/84 mmHg  Pulse 93  Temp(Src) 97.7 F (36.5 C) (Oral)  Resp 16  SpO2 99% Physical Exam  Constitutional: He is oriented to person, place, and time. He appears well-developed and well-nourished.  HENT:  Head: Normocephalic and atraumatic.  Neck: Normal range of motion.  Cardiovascular: Normal rate, regular rhythm and normal heart sounds.   No murmur heard. Pulmonary/Chest: Effort normal and breath sounds normal.  Abdominal: Soft. Bowel sounds are normal. He exhibits no distension and no mass. There is no tenderness. There is no rebound and no guarding.  Scars on abdomen from previous surgeries.  Genitourinary:  Black stool in vault. No active bleeding.  Musculoskeletal: Normal range of motion. He exhibits no edema.  Neurological: He is alert and oriented to person, place, and time. No cranial nerve deficit.  Skin: Skin is warm and dry. No pallor.  Psychiatric: He has a normal mood and affect.  Nursing note and vitals reviewed.   ED Course  Procedures (including critical care time) Labs Review Labs Reviewed  CBC WITH DIFFERENTIAL/PLATELET - Abnormal; Notable for the following:    Platelets 128 (*)    Neutrophils Relative % 85 (*)    Neutro Abs 8.3 (*)    Lymphocytes Relative 8 (*)    All other components within normal limits  COMPREHENSIVE METABOLIC PANEL - Abnormal; Notable for the  following:    Glucose, Bld 103 (*)    BUN 38 (*)    Creatinine, Ser 2.25 (*)    AST 38 (*)    GFR calc non Af Amer 26 (*)    GFR calc Af Amer 30 (*)    All other components within normal limits  URINALYSIS, ROUTINE W REFLEX MICROSCOPIC - Abnormal; Notable for the following:    Color, Urine AMBER (*)    APPearance CLOUDY (*)    Bilirubin Urine SMALL (*)    All other components within normal limits  LIPASE, BLOOD  POC OCCULT BLOOD, ED    Imaging Review No results found.   EKG Interpretation None      MDM   Final diagnoses:  Renal insufficiency    Patient with possible GI bleed. Guaiac negative. Normal hemoglobin. Previous history of GI bleed, however patient has also been on Kaopectate could cause some black stool. Patient recently has had diarrhea and his creatinine is increased. Creatinine is 2.25 here in the last one I have was normal a year  ago. I don't know if his increased since then but he will need follow-up. Patient states he feels good. No lightheadedness. Will discharge home to follow-up with his primary care doctor. Patient does have GI follow-up on Monday, but I will have him call them to see if they want to see him immediately.    Davonna Belling, MD 01/15/15 [phone]

## 2015-01-15 NOTE — ED Notes (Addendum)
Pt from home c/o rectal bleeding. He reports his stools were black. He reports a decreased appetite this week. He also c/o having "the flu" with diarrhea on Wednesday and Thursday. Pt denies weakness. Pt has had several surgeries for removing parts of the colon.

## 2015-01-15 NOTE — Discharge Instructions (Signed)
Follow-up with Dr. Osborne Casco for your increased creatinine. Follow with your gastroenterologist for your possible GI bleeding, although it could've come from the [place] you had been taking.

## 2015-01-18 DIAGNOSIS — Z86010 Personal history of colonic polyps: Secondary | ICD-10-CM | POA: Diagnosis not present

## 2015-01-18 DIAGNOSIS — Z85038 Personal history of other malignant neoplasm of large intestine: Secondary | ICD-10-CM | POA: Diagnosis not present

## 2015-01-18 DIAGNOSIS — R634 Abnormal weight loss: Secondary | ICD-10-CM | POA: Diagnosis not present

## 2015-01-18 DIAGNOSIS — A09 Infectious gastroenteritis and colitis, unspecified: Secondary | ICD-10-CM | POA: Diagnosis not present

## 2015-01-19 DIAGNOSIS — K649 Unspecified hemorrhoids: Secondary | ICD-10-CM | POA: Diagnosis not present

## 2015-01-19 DIAGNOSIS — N289 Disorder of kidney and ureter, unspecified: Secondary | ICD-10-CM | POA: Diagnosis not present

## 2015-01-19 DIAGNOSIS — Z6825 Body mass index (BMI) 25.0-25.9, adult: Secondary | ICD-10-CM | POA: Diagnosis not present

## 2015-01-26 DIAGNOSIS — L570 Actinic keratosis: Secondary | ICD-10-CM | POA: Diagnosis not present

## 2015-01-26 DIAGNOSIS — L259 Unspecified contact dermatitis, unspecified cause: Secondary | ICD-10-CM | POA: Diagnosis not present

## 2015-01-26 DIAGNOSIS — L821 Other seborrheic keratosis: Secondary | ICD-10-CM | POA: Diagnosis not present

## 2015-01-26 DIAGNOSIS — X32XXXD Exposure to sunlight, subsequent encounter: Secondary | ICD-10-CM | POA: Diagnosis not present

## 2015-02-09 DIAGNOSIS — H01004 Unspecified blepharitis left upper eyelid: Secondary | ICD-10-CM | POA: Diagnosis not present

## 2015-02-09 DIAGNOSIS — H52203 Unspecified astigmatism, bilateral: Secondary | ICD-10-CM | POA: Diagnosis not present

## 2015-02-09 DIAGNOSIS — H01001 Unspecified blepharitis right upper eyelid: Secondary | ICD-10-CM | POA: Diagnosis not present

## 2015-02-09 DIAGNOSIS — H26493 Other secondary cataract, bilateral: Secondary | ICD-10-CM | POA: Diagnosis not present

## 2015-02-12 DIAGNOSIS — Z09 Encounter for follow-up examination after completed treatment for conditions other than malignant neoplasm: Secondary | ICD-10-CM | POA: Diagnosis not present

## 2015-02-12 DIAGNOSIS — Z980 Intestinal bypass and anastomosis status: Secondary | ICD-10-CM | POA: Diagnosis not present

## 2015-02-12 DIAGNOSIS — K5730 Diverticulosis of large intestine without perforation or abscess without bleeding: Secondary | ICD-10-CM | POA: Diagnosis not present

## 2015-02-12 DIAGNOSIS — Z86010 Personal history of colonic polyps: Secondary | ICD-10-CM | POA: Diagnosis not present

## 2015-02-12 DIAGNOSIS — Z85038 Personal history of other malignant neoplasm of large intestine: Secondary | ICD-10-CM | POA: Diagnosis not present

## 2015-02-12 DIAGNOSIS — K6289 Other specified diseases of anus and rectum: Secondary | ICD-10-CM | POA: Diagnosis not present

## 2015-02-26 DIAGNOSIS — C4449 Other specified malignant neoplasm of skin of scalp and neck: Secondary | ICD-10-CM | POA: Diagnosis not present

## 2015-02-26 DIAGNOSIS — C4440 Unspecified malignant neoplasm of skin of scalp and neck: Secondary | ICD-10-CM | POA: Diagnosis not present

## 2015-03-09 DIAGNOSIS — C4440 Unspecified malignant neoplasm of skin of scalp and neck: Secondary | ICD-10-CM | POA: Diagnosis not present

## 2015-03-10 DIAGNOSIS — C801 Malignant (primary) neoplasm, unspecified: Secondary | ICD-10-CM | POA: Diagnosis not present

## 2015-03-15 DIAGNOSIS — C4440 Unspecified malignant neoplasm of skin of scalp and neck: Secondary | ICD-10-CM | POA: Diagnosis not present

## 2015-03-16 DIAGNOSIS — I10 Essential (primary) hypertension: Secondary | ICD-10-CM | POA: Diagnosis not present

## 2015-03-16 DIAGNOSIS — C4440 Unspecified malignant neoplasm of skin of scalp and neck: Secondary | ICD-10-CM | POA: Diagnosis not present

## 2015-03-16 DIAGNOSIS — C490 Malignant neoplasm of connective and soft tissue of head, face and neck: Secondary | ICD-10-CM | POA: Diagnosis not present

## 2015-03-16 DIAGNOSIS — C4449 Other specified malignant neoplasm of skin of scalp and neck: Secondary | ICD-10-CM | POA: Diagnosis not present

## 2015-03-31 DIAGNOSIS — C801 Malignant (primary) neoplasm, unspecified: Secondary | ICD-10-CM | POA: Diagnosis not present

## 2015-04-06 DIAGNOSIS — Z08 Encounter for follow-up examination after completed treatment for malignant neoplasm: Secondary | ICD-10-CM | POA: Diagnosis not present

## 2015-04-06 DIAGNOSIS — Z85828 Personal history of other malignant neoplasm of skin: Secondary | ICD-10-CM | POA: Diagnosis not present

## 2015-06-08 DIAGNOSIS — Z08 Encounter for follow-up examination after completed treatment for malignant neoplasm: Secondary | ICD-10-CM | POA: Diagnosis not present

## 2015-06-08 DIAGNOSIS — L0202 Furuncle of face: Secondary | ICD-10-CM | POA: Diagnosis not present

## 2015-06-08 DIAGNOSIS — B9689 Other specified bacterial agents as the cause of diseases classified elsewhere: Secondary | ICD-10-CM | POA: Diagnosis not present

## 2015-06-08 DIAGNOSIS — X32XXXD Exposure to sunlight, subsequent encounter: Secondary | ICD-10-CM | POA: Diagnosis not present

## 2015-06-08 DIAGNOSIS — L570 Actinic keratosis: Secondary | ICD-10-CM | POA: Diagnosis not present

## 2015-06-08 DIAGNOSIS — Z85828 Personal history of other malignant neoplasm of skin: Secondary | ICD-10-CM | POA: Diagnosis not present

## 2015-06-14 DIAGNOSIS — L719 Rosacea, unspecified: Secondary | ICD-10-CM | POA: Diagnosis not present

## 2015-06-14 DIAGNOSIS — Z1389 Encounter for screening for other disorder: Secondary | ICD-10-CM | POA: Diagnosis not present

## 2015-06-14 DIAGNOSIS — K649 Unspecified hemorrhoids: Secondary | ICD-10-CM | POA: Diagnosis not present

## 2015-06-14 DIAGNOSIS — Z6826 Body mass index (BMI) 26.0-26.9, adult: Secondary | ICD-10-CM | POA: Diagnosis not present

## 2015-06-14 DIAGNOSIS — J309 Allergic rhinitis, unspecified: Secondary | ICD-10-CM | POA: Diagnosis not present

## 2015-06-14 DIAGNOSIS — C189 Malignant neoplasm of colon, unspecified: Secondary | ICD-10-CM | POA: Diagnosis not present

## 2015-06-14 DIAGNOSIS — I444 Left anterior fascicular block: Secondary | ICD-10-CM | POA: Diagnosis not present

## 2015-06-14 DIAGNOSIS — M109 Gout, unspecified: Secondary | ICD-10-CM | POA: Diagnosis not present

## 2015-06-14 DIAGNOSIS — I10 Essential (primary) hypertension: Secondary | ICD-10-CM | POA: Diagnosis not present

## 2015-06-14 DIAGNOSIS — I2699 Other pulmonary embolism without acute cor pulmonale: Secondary | ICD-10-CM | POA: Diagnosis not present

## 2015-06-14 DIAGNOSIS — M1990 Unspecified osteoarthritis, unspecified site: Secondary | ICD-10-CM | POA: Diagnosis not present

## 2015-06-14 DIAGNOSIS — N183 Chronic kidney disease, stage 3 (moderate): Secondary | ICD-10-CM | POA: Diagnosis not present

## 2015-06-30 DIAGNOSIS — C4441 Basal cell carcinoma of skin of scalp and neck: Secondary | ICD-10-CM | POA: Diagnosis not present

## 2015-06-30 DIAGNOSIS — C44310 Basal cell carcinoma of skin of unspecified parts of face: Secondary | ICD-10-CM | POA: Diagnosis not present

## 2015-08-10 DIAGNOSIS — Z85828 Personal history of other malignant neoplasm of skin: Secondary | ICD-10-CM | POA: Diagnosis not present

## 2015-08-10 DIAGNOSIS — Z08 Encounter for follow-up examination after completed treatment for malignant neoplasm: Secondary | ICD-10-CM | POA: Diagnosis not present

## 2015-08-10 DIAGNOSIS — X32XXXD Exposure to sunlight, subsequent encounter: Secondary | ICD-10-CM | POA: Diagnosis not present

## 2015-08-10 DIAGNOSIS — C4440 Unspecified malignant neoplasm of skin of scalp and neck: Secondary | ICD-10-CM | POA: Diagnosis not present

## 2015-08-10 DIAGNOSIS — C4441 Basal cell carcinoma of skin of scalp and neck: Secondary | ICD-10-CM | POA: Diagnosis not present

## 2015-08-10 DIAGNOSIS — L570 Actinic keratosis: Secondary | ICD-10-CM | POA: Diagnosis not present

## 2015-08-10 DIAGNOSIS — L219 Seborrheic dermatitis, unspecified: Secondary | ICD-10-CM | POA: Diagnosis not present

## 2015-10-05 DIAGNOSIS — Z85828 Personal history of other malignant neoplasm of skin: Secondary | ICD-10-CM | POA: Diagnosis not present

## 2015-10-05 DIAGNOSIS — L02229 Furuncle of trunk, unspecified: Secondary | ICD-10-CM | POA: Diagnosis not present

## 2015-10-05 DIAGNOSIS — Z08 Encounter for follow-up examination after completed treatment for malignant neoplasm: Secondary | ICD-10-CM | POA: Diagnosis not present

## 2015-10-05 DIAGNOSIS — B9689 Other specified bacterial agents as the cause of diseases classified elsewhere: Secondary | ICD-10-CM | POA: Diagnosis not present

## 2015-10-05 DIAGNOSIS — L309 Dermatitis, unspecified: Secondary | ICD-10-CM | POA: Diagnosis not present

## 2015-10-27 DIAGNOSIS — X32XXXD Exposure to sunlight, subsequent encounter: Secondary | ICD-10-CM | POA: Diagnosis not present

## 2015-10-27 DIAGNOSIS — L570 Actinic keratosis: Secondary | ICD-10-CM | POA: Diagnosis not present

## 2015-11-24 DIAGNOSIS — X32XXXD Exposure to sunlight, subsequent encounter: Secondary | ICD-10-CM | POA: Diagnosis not present

## 2015-11-24 DIAGNOSIS — L02224 Furuncle of groin: Secondary | ICD-10-CM | POA: Diagnosis not present

## 2015-11-24 DIAGNOSIS — L570 Actinic keratosis: Secondary | ICD-10-CM | POA: Diagnosis not present

## 2015-11-24 DIAGNOSIS — B9689 Other specified bacterial agents as the cause of diseases classified elsewhere: Secondary | ICD-10-CM | POA: Diagnosis not present

## 2015-12-15 DIAGNOSIS — M109 Gout, unspecified: Secondary | ICD-10-CM | POA: Diagnosis not present

## 2015-12-15 DIAGNOSIS — Z125 Encounter for screening for malignant neoplasm of prostate: Secondary | ICD-10-CM | POA: Diagnosis not present

## 2015-12-15 DIAGNOSIS — I10 Essential (primary) hypertension: Secondary | ICD-10-CM | POA: Diagnosis not present

## 2015-12-22 DIAGNOSIS — R808 Other proteinuria: Secondary | ICD-10-CM | POA: Diagnosis not present

## 2015-12-22 DIAGNOSIS — C189 Malignant neoplasm of colon, unspecified: Secondary | ICD-10-CM | POA: Diagnosis not present

## 2015-12-22 DIAGNOSIS — Z0000 Encounter for general adult medical examination without abnormal findings: Secondary | ICD-10-CM | POA: Diagnosis not present

## 2015-12-22 DIAGNOSIS — I10 Essential (primary) hypertension: Secondary | ICD-10-CM | POA: Diagnosis not present

## 2015-12-22 DIAGNOSIS — Z1389 Encounter for screening for other disorder: Secondary | ICD-10-CM | POA: Diagnosis not present

## 2015-12-22 DIAGNOSIS — Z8546 Personal history of malignant neoplasm of prostate: Secondary | ICD-10-CM | POA: Diagnosis not present

## 2015-12-22 DIAGNOSIS — I1310 Hypertensive heart and chronic kidney disease without heart failure, with stage 1 through stage 4 chronic kidney disease, or unspecified chronic kidney disease: Secondary | ICD-10-CM | POA: Diagnosis not present

## 2015-12-22 DIAGNOSIS — I2699 Other pulmonary embolism without acute cor pulmonale: Secondary | ICD-10-CM | POA: Diagnosis not present

## 2015-12-22 DIAGNOSIS — Z6827 Body mass index (BMI) 27.0-27.9, adult: Secondary | ICD-10-CM | POA: Diagnosis not present

## 2015-12-22 DIAGNOSIS — Z23 Encounter for immunization: Secondary | ICD-10-CM | POA: Diagnosis not present

## 2015-12-22 DIAGNOSIS — I444 Left anterior fascicular block: Secondary | ICD-10-CM | POA: Diagnosis not present

## 2015-12-22 DIAGNOSIS — N183 Chronic kidney disease, stage 3 (moderate): Secondary | ICD-10-CM | POA: Diagnosis not present

## 2016-02-14 DIAGNOSIS — H43813 Vitreous degeneration, bilateral: Secondary | ICD-10-CM | POA: Diagnosis not present

## 2016-02-14 DIAGNOSIS — H5213 Myopia, bilateral: Secondary | ICD-10-CM | POA: Diagnosis not present

## 2016-02-14 DIAGNOSIS — H26493 Other secondary cataract, bilateral: Secondary | ICD-10-CM | POA: Diagnosis not present

## 2016-02-14 DIAGNOSIS — H01001 Unspecified blepharitis right upper eyelid: Secondary | ICD-10-CM | POA: Diagnosis not present

## 2016-03-01 DIAGNOSIS — L02229 Furuncle of trunk, unspecified: Secondary | ICD-10-CM | POA: Diagnosis not present

## 2016-03-01 DIAGNOSIS — L570 Actinic keratosis: Secondary | ICD-10-CM | POA: Diagnosis not present

## 2016-03-01 DIAGNOSIS — Z08 Encounter for follow-up examination after completed treatment for malignant neoplasm: Secondary | ICD-10-CM | POA: Diagnosis not present

## 2016-03-01 DIAGNOSIS — B9689 Other specified bacterial agents as the cause of diseases classified elsewhere: Secondary | ICD-10-CM | POA: Diagnosis not present

## 2016-03-01 DIAGNOSIS — X32XXXD Exposure to sunlight, subsequent encounter: Secondary | ICD-10-CM | POA: Diagnosis not present

## 2016-03-01 DIAGNOSIS — Z85828 Personal history of other malignant neoplasm of skin: Secondary | ICD-10-CM | POA: Diagnosis not present

## 2016-03-06 DIAGNOSIS — X32XXXD Exposure to sunlight, subsequent encounter: Secondary | ICD-10-CM | POA: Diagnosis not present

## 2016-03-06 DIAGNOSIS — L570 Actinic keratosis: Secondary | ICD-10-CM | POA: Diagnosis not present

## 2016-05-25 ENCOUNTER — Encounter: Payer: Self-pay | Admitting: Podiatry

## 2016-05-25 ENCOUNTER — Ambulatory Visit (INDEPENDENT_AMBULATORY_CARE_PROVIDER_SITE_OTHER): Payer: Medicare Other | Admitting: Podiatry

## 2016-05-25 VITALS — BP 117/74 | HR 71

## 2016-05-25 DIAGNOSIS — L97511 Non-pressure chronic ulcer of other part of right foot limited to breakdown of skin: Secondary | ICD-10-CM | POA: Diagnosis not present

## 2016-05-25 DIAGNOSIS — M2041 Other hammer toe(s) (acquired), right foot: Secondary | ICD-10-CM | POA: Diagnosis not present

## 2016-05-25 DIAGNOSIS — G609 Hereditary and idiopathic neuropathy, unspecified: Secondary | ICD-10-CM | POA: Diagnosis not present

## 2016-05-25 NOTE — Progress Notes (Signed)
SUBJECTIVE: 82 y.o. year old male presents complaining of lesion on 2nd toe right. He did not know he had it. The ball of both feet are numb.   OBJECTIVE: DERMATOLOGIC EXAMINATION: Nails:Thick and hypertrophic x 10. Ulcerative digital corn with intradermal bleeding medial aspect of PIPJ 2nd toe right.   VASCULAR EXAMINATION OF LOWER LIMBS: Pedal pulses: All pedal pulses are palpable with normal pulsation.  Temperature gradient from tibial crest to dorsum of foot is within normal bilateral.  NEUROLOGIC EXAMINATION OF THE LOWER LIMBS: Subjective numbness on forefoot bilateral.  MUSCULOSKELETAL EXAMINATION: Positive for contracted lesser digits bilateral. Hallux valgus bilateral.  ASSESSMENT: Hammer toe 2nd right. Hallux valgus right. Pre ulcerative digital corn 2nd right with intradermal bleeding. Peripheral neuropathy.  PLAN: Reviewed findings. Lesion debrided and padded.  Return in 2 months.

## 2016-05-25 NOTE — Patient Instructions (Signed)
Seen for painful corn 2nd digit right. Debrided and padded. Return in 2 weeks.

## 2016-06-02 DIAGNOSIS — L570 Actinic keratosis: Secondary | ICD-10-CM | POA: Diagnosis not present

## 2016-06-02 DIAGNOSIS — X32XXXD Exposure to sunlight, subsequent encounter: Secondary | ICD-10-CM | POA: Diagnosis not present

## 2016-06-02 DIAGNOSIS — B078 Other viral warts: Secondary | ICD-10-CM | POA: Diagnosis not present

## 2016-06-20 DIAGNOSIS — M109 Gout, unspecified: Secondary | ICD-10-CM | POA: Diagnosis not present

## 2016-06-20 DIAGNOSIS — N183 Chronic kidney disease, stage 3 (moderate): Secondary | ICD-10-CM | POA: Diagnosis not present

## 2016-06-20 DIAGNOSIS — I1310 Hypertensive heart and chronic kidney disease without heart failure, with stage 1 through stage 4 chronic kidney disease, or unspecified chronic kidney disease: Secondary | ICD-10-CM | POA: Diagnosis not present

## 2016-06-20 DIAGNOSIS — R808 Other proteinuria: Secondary | ICD-10-CM | POA: Diagnosis not present

## 2016-06-20 DIAGNOSIS — Z6827 Body mass index (BMI) 27.0-27.9, adult: Secondary | ICD-10-CM | POA: Diagnosis not present

## 2016-06-20 DIAGNOSIS — L719 Rosacea, unspecified: Secondary | ICD-10-CM | POA: Diagnosis not present

## 2016-06-20 DIAGNOSIS — C189 Malignant neoplasm of colon, unspecified: Secondary | ICD-10-CM | POA: Diagnosis not present

## 2016-06-20 DIAGNOSIS — M1990 Unspecified osteoarthritis, unspecified site: Secondary | ICD-10-CM | POA: Diagnosis not present

## 2016-06-20 DIAGNOSIS — Z23 Encounter for immunization: Secondary | ICD-10-CM | POA: Diagnosis not present

## 2016-06-20 DIAGNOSIS — I10 Essential (primary) hypertension: Secondary | ICD-10-CM | POA: Diagnosis not present

## 2016-06-20 DIAGNOSIS — I2699 Other pulmonary embolism without acute cor pulmonale: Secondary | ICD-10-CM | POA: Diagnosis not present

## 2016-07-25 ENCOUNTER — Ambulatory Visit: Payer: Medicare Other | Admitting: Podiatry

## 2016-08-15 DIAGNOSIS — L570 Actinic keratosis: Secondary | ICD-10-CM | POA: Diagnosis not present

## 2016-08-15 DIAGNOSIS — X32XXXD Exposure to sunlight, subsequent encounter: Secondary | ICD-10-CM | POA: Diagnosis not present

## 2016-08-15 DIAGNOSIS — L218 Other seborrheic dermatitis: Secondary | ICD-10-CM | POA: Diagnosis not present

## 2016-08-15 DIAGNOSIS — C44311 Basal cell carcinoma of skin of nose: Secondary | ICD-10-CM | POA: Diagnosis not present

## 2016-08-15 DIAGNOSIS — B078 Other viral warts: Secondary | ICD-10-CM | POA: Diagnosis not present

## 2016-08-18 DIAGNOSIS — M25551 Pain in right hip: Secondary | ICD-10-CM | POA: Diagnosis not present

## 2016-08-18 DIAGNOSIS — Z6826 Body mass index (BMI) 26.0-26.9, adult: Secondary | ICD-10-CM | POA: Diagnosis not present

## 2016-08-18 DIAGNOSIS — R1084 Generalized abdominal pain: Secondary | ICD-10-CM | POA: Diagnosis not present

## 2016-09-20 DIAGNOSIS — L718 Other rosacea: Secondary | ICD-10-CM | POA: Diagnosis not present

## 2016-09-20 DIAGNOSIS — L570 Actinic keratosis: Secondary | ICD-10-CM | POA: Diagnosis not present

## 2016-09-20 DIAGNOSIS — X32XXXD Exposure to sunlight, subsequent encounter: Secondary | ICD-10-CM | POA: Diagnosis not present

## 2016-09-20 DIAGNOSIS — B078 Other viral warts: Secondary | ICD-10-CM | POA: Diagnosis not present

## 2016-09-27 DIAGNOSIS — Z6826 Body mass index (BMI) 26.0-26.9, adult: Secondary | ICD-10-CM | POA: Diagnosis not present

## 2016-09-27 DIAGNOSIS — M542 Cervicalgia: Secondary | ICD-10-CM | POA: Diagnosis not present

## 2016-10-26 DIAGNOSIS — M542 Cervicalgia: Secondary | ICD-10-CM | POA: Diagnosis not present

## 2016-10-26 DIAGNOSIS — Z6826 Body mass index (BMI) 26.0-26.9, adult: Secondary | ICD-10-CM | POA: Diagnosis not present

## 2016-12-20 DIAGNOSIS — R808 Other proteinuria: Secondary | ICD-10-CM | POA: Diagnosis not present

## 2016-12-20 DIAGNOSIS — Z125 Encounter for screening for malignant neoplasm of prostate: Secondary | ICD-10-CM | POA: Diagnosis not present

## 2016-12-20 DIAGNOSIS — M109 Gout, unspecified: Secondary | ICD-10-CM | POA: Diagnosis not present

## 2016-12-20 DIAGNOSIS — I10 Essential (primary) hypertension: Secondary | ICD-10-CM | POA: Diagnosis not present

## 2016-12-27 DIAGNOSIS — R808 Other proteinuria: Secondary | ICD-10-CM | POA: Diagnosis not present

## 2016-12-27 DIAGNOSIS — Z6826 Body mass index (BMI) 26.0-26.9, adult: Secondary | ICD-10-CM | POA: Diagnosis not present

## 2016-12-27 DIAGNOSIS — J309 Allergic rhinitis, unspecified: Secondary | ICD-10-CM | POA: Diagnosis not present

## 2016-12-27 DIAGNOSIS — L719 Rosacea, unspecified: Secondary | ICD-10-CM | POA: Diagnosis not present

## 2016-12-27 DIAGNOSIS — Z0000 Encounter for general adult medical examination without abnormal findings: Secondary | ICD-10-CM | POA: Diagnosis not present

## 2016-12-27 DIAGNOSIS — N183 Chronic kidney disease, stage 3 (moderate): Secondary | ICD-10-CM | POA: Diagnosis not present

## 2016-12-27 DIAGNOSIS — Z1389 Encounter for screening for other disorder: Secondary | ICD-10-CM | POA: Diagnosis not present

## 2016-12-27 DIAGNOSIS — I444 Left anterior fascicular block: Secondary | ICD-10-CM | POA: Diagnosis not present

## 2016-12-27 DIAGNOSIS — I1310 Hypertensive heart and chronic kidney disease without heart failure, with stage 1 through stage 4 chronic kidney disease, or unspecified chronic kidney disease: Secondary | ICD-10-CM | POA: Diagnosis not present

## 2016-12-27 DIAGNOSIS — I2699 Other pulmonary embolism without acute cor pulmonale: Secondary | ICD-10-CM | POA: Diagnosis not present

## 2016-12-27 DIAGNOSIS — C189 Malignant neoplasm of colon, unspecified: Secondary | ICD-10-CM | POA: Diagnosis not present

## 2016-12-27 DIAGNOSIS — I10 Essential (primary) hypertension: Secondary | ICD-10-CM | POA: Diagnosis not present

## 2017-02-01 ENCOUNTER — Ambulatory Visit (HOSPITAL_COMMUNITY)
Admission: RE | Admit: 2017-02-01 | Discharge: 2017-02-01 | Disposition: A | Discharge status: Home / Self Care | Payer: Medicare Other | Source: Ambulatory Visit | Attending: Vascular Surgery | Admitting: Vascular Surgery

## 2017-02-01 ENCOUNTER — Other Ambulatory Visit (HOSPITAL_COMMUNITY): Payer: Self-pay | Admitting: Internal Medicine

## 2017-02-01 ENCOUNTER — Encounter (HOSPITAL_COMMUNITY): Payer: Self-pay

## 2017-02-01 DIAGNOSIS — I739 Peripheral vascular disease, unspecified: Secondary | ICD-10-CM | POA: Insufficient documentation

## 2017-02-09 DIAGNOSIS — L718 Other rosacea: Secondary | ICD-10-CM | POA: Diagnosis not present

## 2017-02-09 DIAGNOSIS — L218 Other seborrheic dermatitis: Secondary | ICD-10-CM | POA: Diagnosis not present

## 2017-02-13 DIAGNOSIS — H43813 Vitreous degeneration, bilateral: Secondary | ICD-10-CM | POA: Diagnosis not present

## 2017-02-13 DIAGNOSIS — H01001 Unspecified blepharitis right upper eyelid: Secondary | ICD-10-CM | POA: Diagnosis not present

## 2017-02-13 DIAGNOSIS — H5213 Myopia, bilateral: Secondary | ICD-10-CM | POA: Diagnosis not present

## 2017-02-13 DIAGNOSIS — H26493 Other secondary cataract, bilateral: Secondary | ICD-10-CM | POA: Diagnosis not present

## 2017-03-26 ENCOUNTER — Emergency Department (HOSPITAL_COMMUNITY)
Admission: EM | Admit: 2017-03-26 | Discharge: 2017-03-26 | Disposition: A | Discharge status: Home / Self Care | Payer: Medicare Other | Attending: Emergency Medicine | Admitting: Emergency Medicine

## 2017-03-26 ENCOUNTER — Encounter (HOSPITAL_COMMUNITY): Payer: Self-pay | Admitting: Emergency Medicine

## 2017-03-26 ENCOUNTER — Emergency Department (HOSPITAL_COMMUNITY): Payer: Medicare Other

## 2017-03-26 DIAGNOSIS — K8020 Calculus of gallbladder without cholecystitis without obstruction: Secondary | ICD-10-CM | POA: Diagnosis not present

## 2017-03-26 DIAGNOSIS — Z8546 Personal history of malignant neoplasm of prostate: Secondary | ICD-10-CM | POA: Insufficient documentation

## 2017-03-26 DIAGNOSIS — Z87891 Personal history of nicotine dependence: Secondary | ICD-10-CM | POA: Insufficient documentation

## 2017-03-26 DIAGNOSIS — K6289 Other specified diseases of anus and rectum: Secondary | ICD-10-CM | POA: Insufficient documentation

## 2017-03-26 DIAGNOSIS — K5730 Diverticulosis of large intestine without perforation or abscess without bleeding: Secondary | ICD-10-CM | POA: Diagnosis not present

## 2017-03-26 DIAGNOSIS — Z85038 Personal history of other malignant neoplasm of large intestine: Secondary | ICD-10-CM | POA: Diagnosis not present

## 2017-03-26 DIAGNOSIS — K625 Hemorrhage of anus and rectum: Secondary | ICD-10-CM | POA: Diagnosis not present

## 2017-03-26 DIAGNOSIS — Z79899 Other long term (current) drug therapy: Secondary | ICD-10-CM | POA: Insufficient documentation

## 2017-03-26 DIAGNOSIS — I10 Essential (primary) hypertension: Secondary | ICD-10-CM | POA: Insufficient documentation

## 2017-03-26 DIAGNOSIS — Z7982 Long term (current) use of aspirin: Secondary | ICD-10-CM | POA: Diagnosis not present

## 2017-03-26 DIAGNOSIS — K5900 Constipation, unspecified: Secondary | ICD-10-CM | POA: Insufficient documentation

## 2017-03-26 DIAGNOSIS — R229 Localized swelling, mass and lump, unspecified: Secondary | ICD-10-CM | POA: Diagnosis not present

## 2017-03-26 DIAGNOSIS — K449 Diaphragmatic hernia without obstruction or gangrene: Secondary | ICD-10-CM | POA: Diagnosis not present

## 2017-03-26 LAB — BASIC METABOLIC PANEL
ANION GAP: 6 (ref 5–15)
BUN: 28 mg/dL — ABNORMAL HIGH (ref 6–20)
CHLORIDE: 107 mmol/L (ref 101–111)
CO2: 26 mmol/L (ref 22–32)
CREATININE: 1.33 mg/dL — AB (ref 0.61–1.24)
Calcium: 9.5 mg/dL (ref 8.9–10.3)
GFR calc non Af Amer: 48 mL/min — ABNORMAL LOW (ref >60)
GFR, EST AFRICAN AMERICAN: 55 mL/min — AB (ref >60)
Glucose, Bld: 103 mg/dL — ABNORMAL HIGH (ref 65–99)
Potassium: 3.6 mmol/L (ref 3.5–5.1)
SODIUM: 139 mmol/L (ref 135–145)

## 2017-03-26 LAB — CBC WITH DIFFERENTIAL/PLATELET
BASOS PCT: 0 %
Basophils Absolute: 0 10*3/uL (ref 0.0–0.1)
Eosinophils Absolute: 0.3 10*3/uL (ref 0.0–0.7)
Eosinophils Relative: 4 %
HEMATOCRIT: 36.6 % — AB (ref 39.0–52.0)
HEMOGLOBIN: 12.5 g/dL — AB (ref 13.0–17.0)
LYMPHS ABS: 1.2 10*3/uL (ref 0.7–4.0)
Lymphocytes Relative: 17 %
MCH: 32 pg (ref 26.0–34.0)
MCHC: 34.2 g/dL (ref 30.0–36.0)
MCV: 93.6 fL (ref 78.0–100.0)
Monocytes Absolute: 0.4 10*3/uL (ref 0.1–1.0)
Monocytes Relative: 6 %
NEUTROS ABS: 4.9 10*3/uL (ref 1.7–7.7)
NEUTROS PCT: 73 %
Platelets: 180 10*3/uL (ref 150–400)
RBC: 3.91 MIL/uL — AB (ref 4.22–5.81)
RDW: 14.5 % (ref 11.5–15.5)
WBC: 6.7 10*3/uL (ref 4.0–10.5)

## 2017-03-26 LAB — POC OCCULT BLOOD, ED: FECAL OCCULT BLD: POSITIVE — AB

## 2017-03-26 MED ORDER — IOPAMIDOL (ISOVUE-300) INJECTION 61%
INTRAVENOUS | Status: AC
  Filled 2017-03-26: qty 100

## 2017-03-26 MED ORDER — IOPAMIDOL (ISOVUE-300) INJECTION 61%
100.0000 mL | Freq: Once | INTRAVENOUS | Status: AC | PRN
  Administered 2017-03-26: 100 mL via INTRAVENOUS

## 2017-03-26 MED ORDER — DOCUSATE SODIUM 100 MG PO CAPS: 100.0000 mg | ORAL_CAPSULE | Freq: Two times a day (BID) | ORAL | 0 refills | Status: DC | PRN

## 2017-03-26 NOTE — ED Provider Notes (Signed)
[facility] DEPT Provider Note   CSN: [id number] Arrival date & time: 03/26/17  1233     History   Chief Complaint Chief Complaint  Patient presents with  . Rectal Bleeding    HPI [name] is a 83 y.o. male.  HPI   Pt p/w constipation and frank blood with wiping x 3 days. States he has had bowel resection x 2 for diverticulitis and polyps and usually has 2-6 bowel movements daily.  3 days ago he took imodium because his bowel movements has increased but he then could not have any bowel movements. He use a suppository and an enema this morning without a bowel movement, and has since had only enema fluid and blood coming out since, no stool.  Denies fevers, N/V, abdominal pain, urinary symptoms.     PCP -  Dr Osborne Casco GI -  Dr Watt Climes Surgeon -  Dr Zella Richer  Past Medical History:  Diagnosis Date  . Adenocarcinoma of colon ([place])   . Diverticulitis   . Gout    no recent flare  . Hemorrhoids   . History of blood transfusion   . Hypertension   . Incontinence   . Peripheral vascular disease ([place]) 2011   DVT right leg  . Prostate carcinoma [facility])     Patient Active Problem List   Diagnosis Date Noted  .  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 Colon cancer ([place]) 12/15/2013  . Acute lower GI bleeding 11/14/2013  . History of pulmonary embolism 11/14/2013  . LOCALIZED SUPERFICIAL SWELLING MASS OR LUMP 09/30/2010  . DIVERTICULAR DISEASE 06/21/2009  . ACNE ROSACEA 05/12/2009  . ARTHRALGIA 05/12/2009  . GOUT, UNSPECIFIED 05/11/2008  . HYPERTENSION 05/11/2008  . KIDNEY DISEASE, CHRONIC NOS 05/11/2008  . COUGH 04/17/2007  . ESOPHAGEAL STRICTURE 01/23/2007  . ADENOCARCINOMA, PROSTATE, HX OF 01/23/2007    Past Surgical History:  Procedure Laterality Date  . COLON SURGERY    . COLONOSCOPY N/A 11/17/2013   Procedure: COLONOSCOPY;  Surgeon: Jeryl Columbia, MD;  Location: [facility] ENDOSCOPY;  Service: Endoscopy;  Laterality: N/A;  . EYE SURGERY  Bilateral    cataract extraction with IOL  . HERNIA REPAIR  1999  . HOT HEMOSTASIS N/A 11/17/2013   Procedure: HOT HEMOSTASIS (ARGON PLASMA COAGULATION/BICAP);  Surgeon: Jeryl Columbia, MD;  Location: [facility];  Service: Endoscopy;  Laterality: N/A;  . ILEOSTOMY    . LAPAROSCOPIC LYSIS OF ADHESIONS N/A 01/06/2014   Procedure: LAPAROSCOPIC LYSIS OF ADHESIONS;  Surgeon: Odis Hollingshead, MD;  Location: [facility] ORS;  Service: General;  Laterality: N/A;  . LAPAROSCOPIC PARTIAL COLECTOMY N/A 01/06/2014   Procedure: LAPAROSCOPIC ASSISTED PARTIAL COLECTOMY;  Surgeon: Odis Hollingshead, MD;  Location: [facility] ORS;  Service: General;  Laterality: N/A;  . [place]  . TONSILLECTOMY         Home Medications    Prior to Admission medications   Medication Sig Start Date End Date Taking? Authorizing Provider  allopurinol (ZYLOPRIM) 300 MG tablet Take 300 mg by mouth daily.     Yes [provider]  aspirin EC 81 MG tablet Take 81 mg by mouth daily.   Yes [provider]  bisacodyl (FLEET) 10 MG/30ML ENEM Place 10 mg rectally once.   Yes [provider]  carvedilol (COREG) 6.25 MG tablet Take 6.25 mg by mouth 2 (two) times daily with a meal.     Yes [provider]  Dapsone (ACZONE) 5 % topical gel  Apply 1 application topically 2 (two) times daily.   Yes [provider]  hydrochlorothiazide (HYDRODIURIL) 50 MG tablet Take 50 mg by mouth every morning.    Yes [provider]  mesalamine (CANASA) 1000 MG suppository Place 1,000 mg rectally at bedtime.   Yes [provider]  Multiple Vitamin (MULTIVITAMIN WITH MINERALS) TABS tablet Take 1 tablet by mouth daily.   Yes [provider]  potassium chloride SA (K-DUR,KLOR-CON) 20 MEQ tablet Take 20 mEq by mouth daily.   Yes [provider]  docusate sodium (COLACE) 100 MG capsule Take 1 capsule (100 mg total) by mouth 2 (two) times daily as needed for mild constipation or  moderate constipation. 03/26/17   Clayton Bibles, PA-C    Family History No family history on file.  Social History Social History  Substance Use Topics  . Smoking status: Former Smoker    Types: Cigarettes, Pipe    Quit date: 01/02/1980  . Smokeless tobacco: Never Used  . Alcohol use No     Allergies   Sulfonamide derivatives   Review of Systems Review of Systems  All other systems reviewed and are negative.    Physical Exam Updated Vital Signs BP (!) 128/95 (BP Location: Left Arm)   Pulse 72   Temp 97.9 F (36.6 C) (Oral)   Resp 15   Ht 5\' 10"  (1.778 m)   Wt 81.6 kg (180 lb)   SpO2 99%   BMI 25.83 kg/m   Physical Exam  Constitutional: He appears well-developed and well-nourished. No distress.  HENT:  Head: Normocephalic and atraumatic.  Neck: Neck supple.  Cardiovascular: Normal rate and regular rhythm.   Pulmonary/Chest: Effort normal and breath sounds normal. No respiratory distress. He has no wheezes. He has no rales.  Abdominal: Soft. He exhibits no distension and no mass. There is no tenderness. There is no rebound and no guarding.  Genitourinary: Rectal exam shows no external hemorrhoid, no mass, no tenderness and anal tone normal.  Genitourinary Comments: Brown/brick stool on glove.  No frank blood.  No stool impaction.   Neurological: He is alert. He exhibits normal muscle tone.  Skin: He is not diaphoretic.  Nursing note and vitals reviewed.    ED Treatments / Results  Labs (all labs ordered are listed, but only abnormal results are displayed) Labs Reviewed  BASIC METABOLIC PANEL - Abnormal; Notable for the following:       Result Value   Glucose, Bld 103 (*)    BUN 28 (*)    Creatinine, Ser 1.33 (*)    GFR calc non Af Amer 48 (*)    GFR calc Af Amer 55 (*)    All other components within normal limits  CBC WITH DIFFERENTIAL/PLATELET - Abnormal; Notable for the following:    RBC 3.91 (*)    Hemoglobin 12.5 (*)    HCT 36.6 (*)    All other  components within normal limits  POC OCCULT BLOOD, ED - Abnormal; Notable for the following:    Fecal Occult Bld POSITIVE (*)    All other components within normal limits    EKG  EKG Interpretation None       Radiology Ct Abdomen Pelvis W Contrast  Result Date: 03/26/2017 CLINICAL DATA:  Rectal blood following an enema for constipation. EXAM: CT ABDOMEN AND PELVIS WITH CONTRAST TECHNIQUE: Multidetector CT imaging of the abdomen and pelvis was performed using the standard protocol following bolus administration of intravenous contrast. CONTRAST:  134mL ISOVUE-300 IOPAMIDOL (ISOVUE-300)  INJECTION 61% COMPARISON:  10/22/2014. FINDINGS: Lower chest: Very minimal bilateral dependent atelectasis or scarring. Hepatobiliary: Multiple small gallstones in the gallbladder measuring 2 mm in diameter each. No gallbladder wall thickening or pericholecystic fluid. Normal appearing liver. Pancreas: Unremarkable. No pancreatic ductal dilatation or surrounding inflammatory changes. Spleen: Small peripheral cyst.  Normal in size and shape. Adrenals/Urinary Tract: Bilateral renal cysts. The largest is a right lower pole parapelvic and cortical cyst with a maximum diameter of 7.9 cm. There are multiple smaller left renal parapelvic cysts. No hydronephrosis or urinary tract calculi a seen. Unremarkable adrenal glands. Stomach/Bowel: Small hiatal hernia. Small portion of a loop of small bowel and a right lower abdominal ventral hernia without bowel wall thickening in without bowel dilatation. Surgical absence of the inferior portion of the right colon and the appendix. Surgical staple lines involving a right lower quadrant loop of small bowel. Multiple sigmoid colon diverticula. No evidence of diverticulitis. Mildly lobulated posterior inferior rectal wall thickening with a maximum thickness of 1.5 cm. This has an appearance suggesting a mass measuring 3.5 x 1.5 cm on image number 84 of series 2 and 1.1 cm in length on  coronal image number 97. Vascular/Lymphatic: Atheromatous arterial calcifications without aneurysm. No enlarged lymph nodes. Reproductive: Surgically absent prostate gland with bilateral pelvic surgical clips. Other: Small right lower quadrant ventral hernia containing a small amount of herniated small bowel. This is at the site of a probable colostomy scar. Musculoskeletal: Lumbar and lower thoracic spine degenerative changes with grade 1 anterolisthesis at the L3-4 level. Minimal bilateral hip degenerative changes. IMPRESSION: 1. Findings concerning for a posterior, inferior rectal mass measuring 3.5 x 1.5 x 1.1 cm. This is concerning for a primary rectal carcinoma. Correlation with digital examination and proctoscopy is recommended. 2. Sigmoid diverticulosis. 3. Small hiatal hernia. 4. Small right lower quadrant ventral hernia containing a small amount of herniated small bowel without obstruction. 5. Cholelithiasis without evidence of cholecystitis. Electronically Signed   By: Claudie Revering M.D.   On: 03/26/2017 17:22    Procedures Procedures (including critical care time)  Medications Ordered in ED Medications  iopamidol (ISOVUE-300) 61 % injection 100 mL (100 mLs Intravenous Contrast Given 03/26/17 1700)     Initial Impression / Assessment and Plan / ED Course  I have reviewed the triage vital signs and the nursing notes.  Pertinent labs & imaging results that were available during my care of the patient were reviewed by me and considered in my medical decision making (see chart for details).     Afebrile, nontoxic patient with constipation, hematochezia following using imodium 3 days ago.  Hemoccult positive.  Brick-colored stool on exam.  Hgb 12.5.  Labs otherwise baseline. CT demonstrates new rectal mass.  Not noted on rectal exam.  Discussed pt with Dr Ashok Cordia, who also saw pt.  Pt to follow closely with general surgery/GI follow up for further evaluation and treatment of rectal mass.    D/C  home with colace.  Discussed result, findings, treatment, and follow up  with patient.  Pt given return precautions.  Pt verbalizes understanding and agrees with plan.       Final Clinical Impressions(s) / ED Diagnoses   Final diagnoses:  Rectal bleeding  Rectal mass  Constipation, unspecified constipation type    New Prescriptions Discharge Medication List as of 03/26/2017  6:07 PM    START taking these medications   Details  docusate sodium (COLACE) 100 MG capsule Take 1 capsule (100 mg total) by mouth  2 (two) times daily as needed for mild constipation or moderate constipation., Starting Mon 03/26/2017, Print         [place], [place], PA-C 03/26/17 2395    Lajean Saver, MD 04/02/17 [phone]

## 2017-03-26 NOTE — ED Notes (Signed)
PT DISCHARGED. INSTRUCTIONS AND PRESCRIPTION GIVEN. AAOX4. PT IN NO APPARENT DISTRESS OR PAIN. THE OPPORTUNITY TO ASK QUESTIONS WAS PROVIDED. 

## 2017-03-26 NOTE — ED Triage Notes (Signed)
Pt verbalizes rectal bleeding with wiping post enema; pt verbalizes use of enema related to constipation.

## 2017-03-26 NOTE — Discharge Instructions (Signed)
Read the information below.  Use the prescribed medication as directed.  Please discuss all new medications with your pharmacist.  You may return to the Emergency Department at any time for worsening condition or any new symptoms that concern you.    If you develop high fevers, rectal or abdominal pain, uncontrolled vomiting, or are unable to tolerate fluids by mouth, return to the ER for a recheck.

## 2017-03-28 DIAGNOSIS — Z86010 Personal history of colonic polyps: Secondary | ICD-10-CM | POA: Diagnosis not present

## 2017-03-28 DIAGNOSIS — Z85038 Personal history of other malignant neoplasm of large intestine: Secondary | ICD-10-CM | POA: Diagnosis not present

## 2017-03-28 DIAGNOSIS — R933 Abnormal findings on diagnostic imaging of other parts of digestive tract: Secondary | ICD-10-CM | POA: Diagnosis not present

## 2017-03-28 DIAGNOSIS — K921 Melena: Secondary | ICD-10-CM | POA: Diagnosis not present

## 2017-03-29 DIAGNOSIS — Z85038 Personal history of other malignant neoplasm of large intestine: Secondary | ICD-10-CM | POA: Diagnosis not present

## 2017-03-29 DIAGNOSIS — K5750 Diverticulosis of both small and large intestine without perforation or abscess without bleeding: Secondary | ICD-10-CM | POA: Diagnosis not present

## 2017-03-29 DIAGNOSIS — Z980 Intestinal bypass and anastomosis status: Secondary | ICD-10-CM | POA: Diagnosis not present

## 2017-03-29 DIAGNOSIS — Z86010 Personal history of colonic polyps: Secondary | ICD-10-CM | POA: Diagnosis not present

## 2017-03-29 DIAGNOSIS — K6289 Other specified diseases of anus and rectum: Secondary | ICD-10-CM | POA: Diagnosis not present

## 2017-05-26 ENCOUNTER — Emergency Department (HOSPITAL_COMMUNITY)
Admission: EM | Admit: 2017-05-26 | Discharge: 2017-05-26 | Disposition: A | Discharge status: Home / Self Care | Payer: Medicare Other | Attending: Emergency Medicine | Admitting: Emergency Medicine

## 2017-05-26 ENCOUNTER — Encounter (HOSPITAL_COMMUNITY): Payer: Self-pay

## 2017-05-26 DIAGNOSIS — I10 Essential (primary) hypertension: Secondary | ICD-10-CM | POA: Diagnosis not present

## 2017-05-26 DIAGNOSIS — Z8719 Personal history of other diseases of the digestive system: Secondary | ICD-10-CM | POA: Diagnosis not present

## 2017-05-26 DIAGNOSIS — R200 Anesthesia of skin: Secondary | ICD-10-CM

## 2017-05-26 DIAGNOSIS — Z79899 Other long term (current) drug therapy: Secondary | ICD-10-CM | POA: Diagnosis not present

## 2017-05-26 DIAGNOSIS — Z87891 Personal history of nicotine dependence: Secondary | ICD-10-CM | POA: Diagnosis not present

## 2017-05-26 DIAGNOSIS — Z7982 Long term (current) use of aspirin: Secondary | ICD-10-CM | POA: Insufficient documentation

## 2017-05-26 LAB — CBC WITH DIFFERENTIAL/PLATELET
BASOS ABS: 0 10*3/uL (ref 0.0–0.1)
BASOS PCT: 0 %
EOS ABS: 0.2 10*3/uL (ref 0.0–0.7)
EOS PCT: 3 %
HCT: 36.5 % — ABNORMAL LOW (ref 39.0–52.0)
Hemoglobin: 12.2 g/dL — ABNORMAL LOW (ref 13.0–17.0)
Lymphocytes Relative: 13 %
Lymphs Abs: 1 10*3/uL (ref 0.7–4.0)
MCH: 31.4 pg (ref 26.0–34.0)
MCHC: 33.4 g/dL (ref 30.0–36.0)
MCV: 94.1 fL (ref 78.0–100.0)
MONO ABS: 0.3 10*3/uL (ref 0.1–1.0)
MONOS PCT: 4 %
Neutro Abs: 6.3 10*3/uL (ref 1.7–7.7)
Neutrophils Relative %: 80 %
PLATELETS: 189 10*3/uL (ref 150–400)
RBC: 3.88 MIL/uL — ABNORMAL LOW (ref 4.22–5.81)
RDW: 14.7 % (ref 11.5–15.5)
WBC: 7.9 10*3/uL (ref 4.0–10.5)

## 2017-05-26 LAB — BASIC METABOLIC PANEL
ANION GAP: 8 (ref 5–15)
BUN: 24 mg/dL — ABNORMAL HIGH (ref 6–20)
CO2: 28 mmol/L (ref 22–32)
CREATININE: 1.35 mg/dL — AB (ref 0.61–1.24)
Calcium: 9.8 mg/dL (ref 8.9–10.3)
Chloride: 104 mmol/L (ref 101–111)
GFR calc Af Amer: 54 mL/min — ABNORMAL LOW (ref >60)
GFR, EST NON AFRICAN AMERICAN: 47 mL/min — AB (ref >60)
GLUCOSE: 100 mg/dL — AB (ref 65–99)
Potassium: 3.9 mmol/L (ref 3.5–5.1)
Sodium: 140 mmol/L (ref 135–145)

## 2017-05-26 NOTE — ED Triage Notes (Signed)
Pt recently dx with diverticulitis.  Diarrhea improved with antibiotics and immodium.  Pt is stating now after finishing antibiotics on the 28th, he has had neck stiffness.  "Feels different"  No pain

## 2017-05-26 NOTE — ED Provider Notes (Signed)
[facility] DEPT Provider Note   CSN: [id number] Arrival date & time: 05/26/17  0725     History   Chief Complaint Chief Complaint  Patient presents with  . Neck Pain    HPI [name] is a 83 y.o. male.  Patient is an 83 year old male with past medical history of diverticulitis, colon cancer, status post hemicolectomy 2. He presents today with complaints of numbness in his head, chest, and neck. He states that he was recently treated with Cipro and Flagyl for diverticulitis. He had some residual diarrhea which has been controlled with Imodium. Today he woke with the above sensations. He denies any chest pain, abdominal pain, headache. He just describes a sensation that is difficult to describe. He denies any bloody stool, fevers.   The history is provided by the patient.    Past Medical History:  Diagnosis Date  . Adenocarcinoma of colon ([place])   . Diverticulitis   . Gout    no recent flare  . Hemorrhoids   . History of blood transfusion   . Hypertension   . Incontinence   . Peripheral vascular disease ([place]) 2011   DVT right leg  . Prostate carcinoma [facility])     Patient Active Problem List   Diagnosis Date Noted  .  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 Colon cancer ([place]) 12/15/2013  . Acute lower GI bleeding 11/14/2013  . History of pulmonary embolism 11/14/2013  . LOCALIZED SUPERFICIAL SWELLING MASS OR LUMP 09/30/2010  . DIVERTICULAR DISEASE 06/21/2009  . ACNE ROSACEA 05/12/2009  . ARTHRALGIA 05/12/2009  . GOUT, UNSPECIFIED 05/11/2008  . HYPERTENSION 05/11/2008  . KIDNEY DISEASE, CHRONIC NOS 05/11/2008  . COUGH 04/17/2007  . ESOPHAGEAL STRICTURE 01/23/2007  . ADENOCARCINOMA, PROSTATE, HX OF 01/23/2007    Past Surgical History:  Procedure Laterality Date  . COLON SURGERY    . COLONOSCOPY N/A 11/17/2013   Procedure: COLONOSCOPY;  Surgeon: Jeryl Columbia, MD;  Location: [facility];  Service: Endoscopy;  Laterality: N/A;    . diverticulitis    . EYE SURGERY Bilateral    cataract extraction with IOL  . HERNIA REPAIR  1999  . HOT HEMOSTASIS N/A 11/17/2013   Procedure: HOT HEMOSTASIS (ARGON PLASMA COAGULATION/BICAP);  Surgeon: Jeryl Columbia, MD;  Location: [facility];  Service: Endoscopy;  Laterality: N/A;  . ILEOSTOMY    . LAPAROSCOPIC LYSIS OF ADHESIONS N/A 01/06/2014   Procedure: LAPAROSCOPIC LYSIS OF ADHESIONS;  Surgeon: Odis Hollingshead, MD;  Location: [facility] ORS;  Service: General;  Laterality: N/A;  . LAPAROSCOPIC PARTIAL COLECTOMY N/A 01/06/2014   Procedure: LAPAROSCOPIC ASSISTED PARTIAL COLECTOMY;  Surgeon: Odis Hollingshead, MD;  Location: [facility] ORS;  Service: General;  Laterality: N/A;  . [place]  . TONSILLECTOMY         Home Medications    Prior to Admission medications   Medication Sig Start Date End Date Taking? Authorizing Provider  allopurinol (ZYLOPRIM) 300 MG tablet Take 300 mg by mouth daily.      [provider]  aspirin EC 81 MG tablet Take 81 mg by mouth daily.    [provider]  bisacodyl (FLEET) 10 MG/30ML ENEM Place 10 mg rectally once.    [provider]  carvedilol (COREG) 6.25 MG tablet Take 6.25 mg by mouth 2 (two) times daily with a meal.      [provider]  Dapsone (ACZONE) 5 % topical gel Apply 1 application topically 2 (two) times  daily.    [provider]  docusate sodium (COLACE) 100 MG capsule Take 1 capsule (100 mg total) by mouth 2 (two) times daily as needed for mild constipation or moderate constipation. 03/26/17   Clayton Bibles, PA-C  hydrochlorothiazide (HYDRODIURIL) 50 MG tablet Take 50 mg by mouth every morning.     [provider]  mesalamine (CANASA) 1000 MG suppository Place 1,000 mg rectally at bedtime.    [provider]  Multiple Vitamin (MULTIVITAMIN WITH MINERALS) TABS tablet Take 1 tablet by mouth daily.    [provider]  potassium chloride SA (K-DUR,KLOR-CON) 20 MEQ tablet  Take 20 mEq by mouth daily.    [provider]    Family History History reviewed. No pertinent family history.  Social History Social History  Substance Use Topics  . Smoking status: Former Smoker    Types: Cigarettes, Pipe    Quit date: 01/02/1980  . Smokeless tobacco: Never Used  . Alcohol use No     Allergies   Sulfonamide derivatives   Review of Systems Review of Systems  All other systems reviewed and are negative.    Physical Exam Updated Vital Signs BP 125/89 (BP Location: Left Arm)   Pulse 75   Temp 97.8 F (36.6 C) (Oral)   Resp 20   SpO2 100%   Physical Exam  Constitutional: He is oriented to person, place, and time. He appears well-developed and well-nourished. No distress.  HENT:  Head: Normocephalic and atraumatic.  Mouth/Throat: Oropharynx is clear and moist.  Eyes: Pupils are equal, round, and reactive to light. EOM are normal.  Neck: Normal range of motion. Neck supple.  Cardiovascular: Normal rate and regular rhythm.  Exam reveals no friction rub.   No murmur heard. Pulmonary/Chest: Effort normal and breath sounds normal. No respiratory distress. He has no wheezes. He has no rales.  Abdominal: Soft. Bowel sounds are normal. He exhibits no distension. There is no tenderness.  Musculoskeletal: Normal range of motion. He exhibits no edema.  Neurological: He is alert and oriented to person, place, and time. No cranial nerve deficit. He exhibits normal muscle tone. Coordination normal.  Skin: Skin is warm and dry. He is not diaphoretic.  Nursing note and vitals reviewed.    ED Treatments / Results  Labs (all labs ordered are listed, but only abnormal results are displayed) Labs Reviewed  BASIC METABOLIC PANEL  CBC WITH DIFFERENTIAL/PLATELET    EKG  EKG Interpretation None       Radiology No results found.  Procedures Procedures (including critical care time)  Medications Ordered in ED Medications - No data to  display   Initial Impression / Assessment and Plan / ED Course  I have reviewed the triage vital signs and the nursing notes.  Pertinent labs & imaging results that were available during my care of the patient were reviewed by me and considered in my medical decision making (see chart for details).  Patient presents with vague complaints as listed in history of present illness following treatment for diverticulitis. I am uncertain as to whether this is a side effect of his medications, or just related to the illness itself, however nothing today appears emergent. His laboratory studies are essentially unremarkable. He has no electrolyte disturbance and no leukocytosis. I see no indication at this time for further workup. He will be discharged, to follow-up as needed if he worsens.  Final Clinical Impressions(s) / ED Diagnoses   Final diagnoses:  None    New Prescriptions  New Prescriptions   No medications on file     Veryl Speak, MD 05/26/17 [phone]

## 2017-05-26 NOTE — Discharge Instructions (Signed)
Continue your medications as before.  Return to the emergency department if you develop severe pain, bloody stools, high fevers, or other new and concerning symptoms.

## 2017-06-19 DIAGNOSIS — L0212 Furuncle of neck: Secondary | ICD-10-CM | POA: Diagnosis not present

## 2017-06-19 DIAGNOSIS — B9689 Other specified bacterial agents as the cause of diseases classified elsewhere: Secondary | ICD-10-CM | POA: Diagnosis not present

## 2017-06-19 DIAGNOSIS — X32XXXD Exposure to sunlight, subsequent encounter: Secondary | ICD-10-CM | POA: Diagnosis not present

## 2017-06-19 DIAGNOSIS — L570 Actinic keratosis: Secondary | ICD-10-CM | POA: Diagnosis not present

## 2017-07-05 DIAGNOSIS — I1310 Hypertensive heart and chronic kidney disease without heart failure, with stage 1 through stage 4 chronic kidney disease, or unspecified chronic kidney disease: Secondary | ICD-10-CM | POA: Diagnosis not present

## 2017-07-05 DIAGNOSIS — Z8546 Personal history of malignant neoplasm of prostate: Secondary | ICD-10-CM | POA: Diagnosis not present

## 2017-07-05 DIAGNOSIS — R808 Other proteinuria: Secondary | ICD-10-CM | POA: Diagnosis not present

## 2017-07-05 DIAGNOSIS — I2699 Other pulmonary embolism without acute cor pulmonale: Secondary | ICD-10-CM | POA: Diagnosis not present

## 2017-07-05 DIAGNOSIS — Z23 Encounter for immunization: Secondary | ICD-10-CM | POA: Diagnosis not present

## 2017-07-05 DIAGNOSIS — C189 Malignant neoplasm of colon, unspecified: Secondary | ICD-10-CM | POA: Diagnosis not present

## 2017-07-05 DIAGNOSIS — Z6826 Body mass index (BMI) 26.0-26.9, adult: Secondary | ICD-10-CM | POA: Diagnosis not present

## 2017-07-05 DIAGNOSIS — I7389 Other specified peripheral vascular diseases: Secondary | ICD-10-CM | POA: Diagnosis not present

## 2017-07-05 DIAGNOSIS — M109 Gout, unspecified: Secondary | ICD-10-CM | POA: Diagnosis not present

## 2017-07-05 DIAGNOSIS — N183 Chronic kidney disease, stage 3 (moderate): Secondary | ICD-10-CM | POA: Diagnosis not present

## 2017-08-14 DIAGNOSIS — L989 Disorder of the skin and subcutaneous tissue, unspecified: Secondary | ICD-10-CM | POA: Diagnosis not present

## 2017-08-14 DIAGNOSIS — L570 Actinic keratosis: Secondary | ICD-10-CM | POA: Diagnosis not present

## 2017-08-14 DIAGNOSIS — L709 Acne, unspecified: Secondary | ICD-10-CM | POA: Diagnosis not present

## 2017-08-14 DIAGNOSIS — L905 Scar conditions and fibrosis of skin: Secondary | ICD-10-CM | POA: Diagnosis not present

## 2017-08-31 DIAGNOSIS — C44329 Squamous cell carcinoma of skin of other parts of face: Secondary | ICD-10-CM | POA: Diagnosis not present

## 2017-08-31 DIAGNOSIS — D0439 Carcinoma in situ of skin of other parts of face: Secondary | ICD-10-CM | POA: Diagnosis not present

## 2017-08-31 DIAGNOSIS — L570 Actinic keratosis: Secondary | ICD-10-CM | POA: Diagnosis not present

## 2017-08-31 DIAGNOSIS — X32XXXD Exposure to sunlight, subsequent encounter: Secondary | ICD-10-CM | POA: Diagnosis not present

## 2017-10-19 DIAGNOSIS — Z85828 Personal history of other malignant neoplasm of skin: Secondary | ICD-10-CM | POA: Diagnosis not present

## 2017-10-19 DIAGNOSIS — Z08 Encounter for follow-up examination after completed treatment for malignant neoplasm: Secondary | ICD-10-CM | POA: Diagnosis not present

## 2017-10-19 DIAGNOSIS — L02224 Furuncle of groin: Secondary | ICD-10-CM | POA: Diagnosis not present

## 2017-10-19 DIAGNOSIS — L570 Actinic keratosis: Secondary | ICD-10-CM | POA: Diagnosis not present

## 2017-10-19 DIAGNOSIS — B9689 Other specified bacterial agents as the cause of diseases classified elsewhere: Secondary | ICD-10-CM | POA: Diagnosis not present

## 2017-10-19 DIAGNOSIS — B078 Other viral warts: Secondary | ICD-10-CM | POA: Diagnosis not present

## 2017-10-19 DIAGNOSIS — X32XXXA Exposure to sunlight, initial encounter: Secondary | ICD-10-CM | POA: Diagnosis not present

## 2017-12-24 DIAGNOSIS — I10 Essential (primary) hypertension: Secondary | ICD-10-CM | POA: Diagnosis not present

## 2017-12-24 DIAGNOSIS — Z125 Encounter for screening for malignant neoplasm of prostate: Secondary | ICD-10-CM | POA: Diagnosis not present

## 2017-12-24 DIAGNOSIS — R82998 Other abnormal findings in urine: Secondary | ICD-10-CM | POA: Diagnosis not present

## 2017-12-24 DIAGNOSIS — C189 Malignant neoplasm of colon, unspecified: Secondary | ICD-10-CM | POA: Diagnosis not present

## 2017-12-24 DIAGNOSIS — M109 Gout, unspecified: Secondary | ICD-10-CM | POA: Diagnosis not present

## 2017-12-31 DIAGNOSIS — Z6826 Body mass index (BMI) 26.0-26.9, adult: Secondary | ICD-10-CM | POA: Diagnosis not present

## 2017-12-31 DIAGNOSIS — C189 Malignant neoplasm of colon, unspecified: Secondary | ICD-10-CM | POA: Diagnosis not present

## 2017-12-31 DIAGNOSIS — M5430 Sciatica, unspecified side: Secondary | ICD-10-CM | POA: Diagnosis not present

## 2017-12-31 DIAGNOSIS — I1310 Hypertensive heart and chronic kidney disease without heart failure, with stage 1 through stage 4 chronic kidney disease, or unspecified chronic kidney disease: Secondary | ICD-10-CM | POA: Diagnosis not present

## 2017-12-31 DIAGNOSIS — I2699 Other pulmonary embolism without acute cor pulmonale: Secondary | ICD-10-CM | POA: Diagnosis not present

## 2017-12-31 DIAGNOSIS — I10 Essential (primary) hypertension: Secondary | ICD-10-CM | POA: Diagnosis not present

## 2017-12-31 DIAGNOSIS — N183 Chronic kidney disease, stage 3 (moderate): Secondary | ICD-10-CM | POA: Diagnosis not present

## 2017-12-31 DIAGNOSIS — Z1389 Encounter for screening for other disorder: Secondary | ICD-10-CM | POA: Diagnosis not present

## 2017-12-31 DIAGNOSIS — Z8546 Personal history of malignant neoplasm of prostate: Secondary | ICD-10-CM | POA: Diagnosis not present

## 2017-12-31 DIAGNOSIS — R808 Other proteinuria: Secondary | ICD-10-CM | POA: Diagnosis not present

## 2017-12-31 DIAGNOSIS — Z0000 Encounter for general adult medical examination without abnormal findings: Secondary | ICD-10-CM | POA: Diagnosis not present

## 2017-12-31 DIAGNOSIS — M109 Gout, unspecified: Secondary | ICD-10-CM | POA: Diagnosis not present

## 2018-02-15 ENCOUNTER — Emergency Department (HOSPITAL_COMMUNITY)
Admission: EM | Admit: 2018-02-15 | Discharge: 2018-02-15 | Disposition: A | Discharge status: Home / Self Care | Payer: Medicare Other | Attending: Emergency Medicine | Admitting: Emergency Medicine

## 2018-02-15 ENCOUNTER — Encounter (HOSPITAL_COMMUNITY): Payer: Self-pay | Admitting: Emergency Medicine

## 2018-02-15 ENCOUNTER — Emergency Department (HOSPITAL_COMMUNITY): Payer: Medicare Other

## 2018-02-15 ENCOUNTER — Other Ambulatory Visit: Payer: Self-pay

## 2018-02-15 DIAGNOSIS — K529 Noninfective gastroenteritis and colitis, unspecified: Secondary | ICD-10-CM | POA: Insufficient documentation

## 2018-02-15 DIAGNOSIS — N289 Disorder of kidney and ureter, unspecified: Secondary | ICD-10-CM | POA: Insufficient documentation

## 2018-02-15 DIAGNOSIS — R1030 Lower abdominal pain, unspecified: Secondary | ICD-10-CM | POA: Insufficient documentation

## 2018-02-15 DIAGNOSIS — Z8546 Personal history of malignant neoplasm of prostate: Secondary | ICD-10-CM | POA: Insufficient documentation

## 2018-02-15 DIAGNOSIS — Z79899 Other long term (current) drug therapy: Secondary | ICD-10-CM | POA: Diagnosis not present

## 2018-02-15 DIAGNOSIS — I10 Essential (primary) hypertension: Secondary | ICD-10-CM | POA: Insufficient documentation

## 2018-02-15 DIAGNOSIS — R1031 Right lower quadrant pain: Secondary | ICD-10-CM | POA: Diagnosis not present

## 2018-02-15 DIAGNOSIS — Z7982 Long term (current) use of aspirin: Secondary | ICD-10-CM | POA: Insufficient documentation

## 2018-02-15 DIAGNOSIS — Z87891 Personal history of nicotine dependence: Secondary | ICD-10-CM | POA: Insufficient documentation

## 2018-02-15 DIAGNOSIS — A09 Infectious gastroenteritis and colitis, unspecified: Secondary | ICD-10-CM | POA: Diagnosis not present

## 2018-02-15 DIAGNOSIS — R1084 Generalized abdominal pain: Secondary | ICD-10-CM | POA: Diagnosis not present

## 2018-02-15 DIAGNOSIS — R197 Diarrhea, unspecified: Secondary | ICD-10-CM | POA: Diagnosis present

## 2018-02-15 DIAGNOSIS — Z85038 Personal history of other malignant neoplasm of large intestine: Secondary | ICD-10-CM | POA: Diagnosis not present

## 2018-02-15 DIAGNOSIS — N281 Cyst of kidney, acquired: Secondary | ICD-10-CM | POA: Diagnosis not present

## 2018-02-15 DIAGNOSIS — I739 Peripheral vascular disease, unspecified: Secondary | ICD-10-CM | POA: Diagnosis not present

## 2018-02-15 LAB — COMPREHENSIVE METABOLIC PANEL
ALK PHOS: 72 U/L (ref 38–126)
ALT: 18 U/L (ref 17–63)
ANION GAP: 11 (ref 5–15)
AST: 18 U/L (ref 15–41)
Albumin: 3.9 g/dL (ref 3.5–5.0)
BUN: 36 mg/dL — ABNORMAL HIGH (ref 6–20)
CALCIUM: 9.8 mg/dL (ref 8.9–10.3)
CO2: 27 mmol/L (ref 22–32)
Chloride: 106 mmol/L (ref 101–111)
Creatinine, Ser: 1.58 mg/dL — ABNORMAL HIGH (ref 0.61–1.24)
GFR calc non Af Amer: 38 mL/min — ABNORMAL LOW (ref >60)
GFR, EST AFRICAN AMERICAN: 45 mL/min — AB (ref >60)
Glucose, Bld: 115 mg/dL — ABNORMAL HIGH (ref 65–99)
POTASSIUM: 3.5 mmol/L (ref 3.5–5.1)
Sodium: 144 mmol/L (ref 135–145)
Total Bilirubin: 1 mg/dL (ref 0.3–1.2)
Total Protein: 7.1 g/dL (ref 6.5–8.1)

## 2018-02-15 LAB — URINALYSIS, ROUTINE W REFLEX MICROSCOPIC
BACTERIA UA: NONE SEEN
BILIRUBIN URINE: NEGATIVE
Glucose, UA: NEGATIVE mg/dL
Ketones, ur: NEGATIVE mg/dL
Leukocytes, UA: NEGATIVE
NITRITE: NEGATIVE
PROTEIN: NEGATIVE mg/dL
SPECIFIC GRAVITY, URINE: 1.016 (ref 1.005–1.030)
pH: 5 (ref 5.0–8.0)

## 2018-02-15 LAB — CBC
HEMATOCRIT: 39.6 % (ref 39.0–52.0)
HEMOGLOBIN: 13.1 g/dL (ref 13.0–17.0)
MCH: 32.1 pg (ref 26.0–34.0)
MCHC: 33.1 g/dL (ref 30.0–36.0)
MCV: 97.1 fL (ref 78.0–100.0)
Platelets: 196 10*3/uL (ref 150–400)
RBC: 4.08 MIL/uL — AB (ref 4.22–5.81)
RDW: 14.7 % (ref 11.5–15.5)
WBC: 9.7 10*3/uL (ref 4.0–10.5)

## 2018-02-15 LAB — LIPASE, BLOOD: LIPASE: 31 U/L (ref 11–51)

## 2018-02-15 MED ORDER — CIPROFLOXACIN HCL 500 MG PO TABS
500.0000 mg | ORAL_TABLET | Freq: Once | ORAL | Status: AC
  Administered 2018-02-15: 500 mg via ORAL
  Filled 2018-02-15: qty 1

## 2018-02-15 MED ORDER — METRONIDAZOLE 500 MG PO TABS: 500.0000 mg | ORAL_TABLET | Freq: Three times a day (TID) | ORAL | 0 refills | Status: DC

## 2018-02-15 MED ORDER — CIPROFLOXACIN HCL 500 MG PO TABS: 500.0000 mg | ORAL_TABLET | Freq: Two times a day (BID) | ORAL | 0 refills | Status: DC

## 2018-02-15 MED ORDER — IOPAMIDOL (ISOVUE-300) INJECTION 61%
30.0000 mL | Freq: Once | INTRAVENOUS | Status: AC | PRN
  Administered 2018-02-15: 30 mL via ORAL

## 2018-02-15 MED ORDER — IOPAMIDOL (ISOVUE-300) INJECTION 61%
INTRAVENOUS | Status: AC
  Filled 2018-02-15: qty 30

## 2018-02-15 MED ORDER — METRONIDAZOLE 500 MG PO TABS
500.0000 mg | ORAL_TABLET | Freq: Once | ORAL | Status: AC
  Administered 2018-02-15: 500 mg via ORAL
  Filled 2018-02-15: qty 1

## 2018-02-15 NOTE — ED Triage Notes (Signed)
Patient is complaining of his abdomen being sore. Patient has a hx of colon cancer. Patient states he had diarrhea 7 times yesterday and took imodium. Patient is worried that something is wrong due to the soreness.

## 2018-02-15 NOTE — ED Provider Notes (Signed)
[facility] DEPT Provider Note   CSN: [id number] Arrival date & time: 02/15/18  0035     History   Chief Complaint Chief Complaint  Patient presents with  . Abdominal Pain    HPI [name] is a 84 y.o. male.  The history is provided by the patient.  He has history of colon cancer, prostate cancer, hypertension, diverticulitis, gout, DVT and comes in because of abdominal soreness.  Yesterday, he had diarrhea about 8 times.  He took a dose of loperamide, and diarrhea has improved.  However, today, he is complaining of generalized abdominal soreness.  Soreness is getting worse and he rates his pain at 6/10.  Nothing makes it better, nothing makes it worse.  He has not had a bowel movement today, but has not passed flatus, and this does not affect the pain.  He denies any nausea or vomiting and denies fever or chills.  He is very concerned because he has had 2 colon operations and has had 22 inches of colon resected, but he has never had abdominal soreness like this before.  Past Medical History:  Diagnosis Date  . Adenocarcinoma of colon ([place])   . Diverticulitis   . Gout    no recent flare  . Hemorrhoids   . History of blood transfusion   . Hypertension   . Incontinence   . Peripheral vascular disease ([place]) 2011   DVT right leg  . Prostate carcinoma [facility])     Patient Active Problem List   Diagnosis Date Noted  .  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 Colon cancer ([place]) 12/15/2013  . Acute lower GI bleeding 11/14/2013  . History of pulmonary embolism 11/14/2013  . LOCALIZED SUPERFICIAL SWELLING MASS OR LUMP 09/30/2010  . DIVERTICULAR DISEASE 06/21/2009  . ACNE ROSACEA 05/12/2009  . ARTHRALGIA 05/12/2009  . Gout, unspecified 05/11/2008  . HYPERTENSION 05/11/2008  . KIDNEY DISEASE, CHRONIC NOS 05/11/2008  . COUGH 04/17/2007  . ESOPHAGEAL STRICTURE 01/23/2007  . ADENOCARCINOMA, PROSTATE, HX OF 01/23/2007      Past Surgical History:  Procedure Laterality Date  . COLON SURGERY    . COLONOSCOPY N/A 11/17/2013   Procedure: COLONOSCOPY;  Surgeon: Jeryl Columbia, MD;  Location: [facility] ENDOSCOPY;  Service: Endoscopy;  Laterality: N/A;  . diverticulitis    . EYE SURGERY Bilateral    cataract extraction with IOL  . HERNIA REPAIR  1999  . HOT HEMOSTASIS N/A 11/17/2013   Procedure: HOT HEMOSTASIS (ARGON PLASMA COAGULATION/BICAP);  Surgeon: Jeryl Columbia, MD;  Location: [facility];  Service: Endoscopy;  Laterality: N/A;  . ILEOSTOMY    . LAPAROSCOPIC LYSIS OF ADHESIONS N/A 01/06/2014   Procedure: LAPAROSCOPIC LYSIS OF ADHESIONS;  Surgeon: Odis Hollingshead, MD;  Location: [facility] ORS;  Service: General;  Laterality: N/A;  . LAPAROSCOPIC PARTIAL COLECTOMY N/A 01/06/2014   Procedure: LAPAROSCOPIC ASSISTED PARTIAL COLECTOMY;  Surgeon: Odis Hollingshead, MD;  Location: [facility] ORS;  Service: General;  Laterality: N/A;  . [place]  . TONSILLECTOMY          Home Medications    Prior to Admission medications   Medication Sig Start Date End Date Taking? Authorizing Provider  allopurinol (ZYLOPRIM) 300 MG tablet Take 300 mg by mouth daily.     Yes [provider]  aspirin EC 81 MG tablet Take 81 mg by mouth daily.   Yes [provider]  carvedilol (COREG) 6.25 MG tablet Take 6.25 mg  by mouth 2 (two) times daily with a meal.     Yes [provider]  hydrochlorothiazide (HYDRODIURIL) 50 MG tablet Take 50 mg by mouth every morning.    Yes [provider]  loperamide (IMODIUM) 2 MG capsule Take 2 mg by mouth as needed for diarrhea or loose stools.   Yes [provider]  Multiple Vitamin (MULTIVITAMIN WITH MINERALS) TABS tablet Take 1 tablet by mouth daily.   Yes [provider]  docusate sodium (COLACE) 100 MG capsule Take 1 capsule (100 mg total) by mouth 2 (two) times daily as needed for mild constipation or moderate constipation. Patient not taking:  Reported on 02/15/2018 03/26/17   Clayton Bibles, PA-C    Family History History reviewed. No pertinent family history.  Social History Social History   Tobacco Use  . Smoking status: Former Smoker    Types: Cigarettes, Pipe    Last attempt to quit: 01/02/1980    Years since quitting: 38.1  . Smokeless tobacco: Never Used  Substance Use Topics  . Alcohol use: No  . Drug use: No     Allergies   Sulfa antibiotics and Sulfonamide derivatives   Review of Systems Review of Systems  All other systems reviewed and are negative.    Physical Exam Updated Vital Signs BP (!) 145/101 (BP Location: Right Arm)   Pulse 89   Temp 97.6 F (36.4 C) (Oral)   Resp 18   Ht 5\' 10"  (1.778 m)   Wt 79.4 kg (175 lb)   SpO2 99%   BMI 25.11 kg/m   Physical Exam  Nursing note and vitals reviewed.  84 year old male, resting comfortably and in no acute distress. Vital signs are significant for elevated blood pressure. Oxygen saturation is 99%, which is normal. Head is normocephalic and atraumatic. PERRLA, EOMI. Oropharynx is clear. Neck is nontender and supple without adenopathy or JVD. Back is nontender and there is no CVA tenderness. Lungs are clear without rales, wheezes, or rhonchi. Chest is nontender. Heart has regular rate and rhythm without murmur. Abdomen is soft, flat, with minimal tenderness diffusely, no localized tenderness.  There are no masses or hepatosplenomegaly and peristalsis is normoactive. Extremities have no cyanosis or edema, full range of motion is present. Skin is warm and dry without rash. Neurologic: Mental status is normal, cranial nerves are intact, there are no motor or sensory deficits.  ED Treatments / Results  Labs (all labs ordered are listed, but only abnormal results are displayed) Labs Reviewed  COMPREHENSIVE METABOLIC PANEL - Abnormal; Notable for the following components:      Result Value   Glucose, Bld 115 (*)    BUN 36 (*)    Creatinine, Ser 1.58  (*)    GFR calc non Af Amer 38 (*)    GFR calc Af Amer 45 (*)    All other components within normal limits  CBC - Abnormal; Notable for the following components:   RBC 4.08 (*)    All other components within normal limits  URINALYSIS, ROUTINE W REFLEX MICROSCOPIC - Abnormal; Notable for the following components:   Hgb urine dipstick SMALL (*)    All other components within normal limits  LIPASE, BLOOD    Radiology Ct Abdomen Pelvis Wo Contrast  Result Date: 02/15/2018 CLINICAL DATA:  Lower abdominal pain, soreness and diarrhea. Microhematuria. History of hypertension and colon cancer post colectomy. Prostate cancer post prostatectomy. EXAM: CT ABDOMEN AND PELVIS WITHOUT CONTRAST TECHNIQUE: Multidetector CT imaging of the  abdomen and pelvis was performed following the standard protocol without IV contrast. COMPARISON:  03/26/2017 FINDINGS: Lower chest: Atelectasis in the lung bases. Mild bronchiectasis. Small esophageal hiatal hernia. Hepatobiliary: Tiny stones layering in the dependent gallbladder. No wall thickening, inflammatory change, and no bile duct dilatation. No focal liver lesions. Pancreas: Unremarkable. No pancreatic ductal dilatation or surrounding inflammatory changes. Spleen: Normal in size without focal abnormality. Adrenals/Urinary Tract: No adrenal gland nodules. Bilateral renal cysts. Largest is in the right lower pole measuring 6.8 cm diameter. No hydronephrosis or hydroureter. No renal, ureteral, or bladder stones. Stomach/Bowel: Partial right hemicolectomy with ileal colonic anastomosis. Mildly dilated proximal small bowel with decompressed bowel distally. Suggestion of mild small bowel wall thickening. Changes could represent partial obstruction and/or enteritis. Transition zone is in the right lower quadrant. Diverticulosis of the sigmoid colon without evidence of diverticulitis. Vascular/Lymphatic: Aortic atherosclerosis. No enlarged abdominal or pelvic lymph nodes.  Reproductive: Prostate gland is surgically absent. Other: No free air or free fluid in the abdomen. Scarring in the right lower quadrant abdominal wall consistent with previous ostomy site. Musculoskeletal: Degenerative changes in the spine. No destructive bone lesions. IMPRESSION: 1. Mildly dilated proximal small bowel with suggestion of mild wall thickening. This may represent partial obstruction and/or enteritis. 2. Cholelithiasis with tiny stones in the gallbladder. No evidence of inflammatory change. 3. Small esophageal hiatal hernia. 4. Benign-appearing renal cysts. 5. Postoperative scarring in the anterior abdominal wall. 6. Aortic atherosclerosis. Electronically Signed   By: Lucienne Capers M.D.   On: 02/15/2018 06:23    Procedures Procedures  Medications Ordered in ED Medications  iopamidol (ISOVUE-300) 61 % injection (has no administration in time range)  ciprofloxacin (CIPRO) tablet 500 mg (has no administration in time range)  metroNIDAZOLE (FLAGYL) tablet 500 mg (has no administration in time range)  iopamidol (ISOVUE-300) 61 % injection 30 mL (30 mLs Oral Contrast Given 02/15/18 0345)     Initial Impression / Assessment and Plan / ED Course  I have reviewed the triage vital signs and the nursing notes.  Pertinent labs & imaging results that were available during my care of the patient were reviewed by me and considered in my medical decision making (see chart for details).  Abdominal pain following episode of diarrhea, benign physical exam.  Old records are reviewed, and he had an ED visit for rectal bleeding 10 months ago at which time CT showed rectal mass concerning for neoplasm.  Patient was asked about this and he states he did follow-up with his physician, but does not recall what was said.  Given his history and the fact that he has not had similar pains in the past, will send for CT of abdomen and pelvis.  Other labs do show renal insufficiency in a range that he has been at  before.  With degree of renal insufficiency, CT will be obtained without intravenous contrast.  CT shows some thickening of the wall and suggestion of partial small bowel obstruction.  I went back to discuss these findings with the patient, and he states that his pain has completely gone and he feels like he is back to normal.  Abdominal exam was repeated and abdomen is soft and completely nontender.  At this point, no clinical evidence of bowel obstruction, will treat for enteritis.  He is given initial dose of ciprofloxacin and metronidazole and discharged with prescriptions for same.  Advised to follow-up with PCP in 10 days.  Strict return precautions given.  Final Clinical Impressions(s) / ED  Diagnoses   Final diagnoses:  Lower abdominal pain  Diarrhea of presumed infectious origin  Enteritis  Renal insufficiency    ED Discharge Orders        Ordered    ciprofloxacin (CIPRO) 500 MG tablet  2 times daily     02/15/18 0753    metroNIDAZOLE (FLAGYL) 500 MG tablet  3 times daily     16/24/46 9507       Delora Fuel, MD 22/57/50 [phone]

## 2018-02-15 NOTE — Discharge Instructions (Addendum)
Return if pain gets worse, you start running a fever, or if you start vomiting.

## 2018-02-27 DIAGNOSIS — H52203 Unspecified astigmatism, bilateral: Secondary | ICD-10-CM | POA: Diagnosis not present

## 2018-02-27 DIAGNOSIS — H26493 Other secondary cataract, bilateral: Secondary | ICD-10-CM | POA: Diagnosis not present

## 2018-02-27 DIAGNOSIS — Z961 Presence of intraocular lens: Secondary | ICD-10-CM | POA: Diagnosis not present

## 2018-02-27 DIAGNOSIS — H43813 Vitreous degeneration, bilateral: Secondary | ICD-10-CM | POA: Diagnosis not present

## 2018-03-27 DIAGNOSIS — L570 Actinic keratosis: Secondary | ICD-10-CM | POA: Diagnosis not present

## 2018-03-27 DIAGNOSIS — D044 Carcinoma in situ of skin of scalp and neck: Secondary | ICD-10-CM | POA: Diagnosis not present

## 2018-03-27 DIAGNOSIS — X32XXXD Exposure to sunlight, subsequent encounter: Secondary | ICD-10-CM | POA: Diagnosis not present

## 2018-05-07 DIAGNOSIS — Z08 Encounter for follow-up examination after completed treatment for malignant neoplasm: Secondary | ICD-10-CM | POA: Diagnosis not present

## 2018-05-07 DIAGNOSIS — X32XXXD Exposure to sunlight, subsequent encounter: Secondary | ICD-10-CM | POA: Diagnosis not present

## 2018-05-07 DIAGNOSIS — L570 Actinic keratosis: Secondary | ICD-10-CM | POA: Diagnosis not present

## 2018-05-07 DIAGNOSIS — Z85828 Personal history of other malignant neoplasm of skin: Secondary | ICD-10-CM | POA: Diagnosis not present

## 2018-05-07 DIAGNOSIS — C44319 Basal cell carcinoma of skin of other parts of face: Secondary | ICD-10-CM | POA: Diagnosis not present

## 2018-06-04 DIAGNOSIS — Z08 Encounter for follow-up examination after completed treatment for malignant neoplasm: Secondary | ICD-10-CM | POA: Diagnosis not present

## 2018-06-04 DIAGNOSIS — X32XXXD Exposure to sunlight, subsequent encounter: Secondary | ICD-10-CM | POA: Diagnosis not present

## 2018-06-04 DIAGNOSIS — C44319 Basal cell carcinoma of skin of other parts of face: Secondary | ICD-10-CM | POA: Diagnosis not present

## 2018-06-04 DIAGNOSIS — Z85828 Personal history of other malignant neoplasm of skin: Secondary | ICD-10-CM | POA: Diagnosis not present

## 2018-06-04 DIAGNOSIS — L570 Actinic keratosis: Secondary | ICD-10-CM | POA: Diagnosis not present

## 2018-07-12 DIAGNOSIS — N183 Chronic kidney disease, stage 3 (moderate): Secondary | ICD-10-CM | POA: Diagnosis not present

## 2018-07-12 DIAGNOSIS — C189 Malignant neoplasm of colon, unspecified: Secondary | ICD-10-CM | POA: Diagnosis not present

## 2018-07-12 DIAGNOSIS — I10 Essential (primary) hypertension: Secondary | ICD-10-CM | POA: Diagnosis not present

## 2018-07-12 DIAGNOSIS — I2699 Other pulmonary embolism without acute cor pulmonale: Secondary | ICD-10-CM | POA: Diagnosis not present

## 2018-07-12 DIAGNOSIS — Z6824 Body mass index (BMI) 24.0-24.9, adult: Secondary | ICD-10-CM | POA: Diagnosis not present

## 2018-07-12 DIAGNOSIS — I1310 Hypertensive heart and chronic kidney disease without heart failure, with stage 1 through stage 4 chronic kidney disease, or unspecified chronic kidney disease: Secondary | ICD-10-CM | POA: Diagnosis not present

## 2018-07-12 DIAGNOSIS — M1990 Unspecified osteoarthritis, unspecified site: Secondary | ICD-10-CM | POA: Diagnosis not present

## 2018-07-12 DIAGNOSIS — Z23 Encounter for immunization: Secondary | ICD-10-CM | POA: Diagnosis not present

## 2018-07-12 DIAGNOSIS — L308 Other specified dermatitis: Secondary | ICD-10-CM | POA: Diagnosis not present

## 2018-07-12 DIAGNOSIS — R808 Other proteinuria: Secondary | ICD-10-CM | POA: Diagnosis not present

## 2018-07-16 DIAGNOSIS — X32XXXD Exposure to sunlight, subsequent encounter: Secondary | ICD-10-CM | POA: Diagnosis not present

## 2018-07-16 DIAGNOSIS — L570 Actinic keratosis: Secondary | ICD-10-CM | POA: Diagnosis not present

## 2018-07-16 DIAGNOSIS — C4441 Basal cell carcinoma of skin of scalp and neck: Secondary | ICD-10-CM | POA: Diagnosis not present

## 2018-08-09 ENCOUNTER — Emergency Department (HOSPITAL_COMMUNITY)
Admission: EM | Admit: 2018-08-09 | Discharge: 2018-08-09 | Disposition: A | Discharge status: Home / Self Care | Payer: Medicare Other | Attending: Emergency Medicine | Admitting: Emergency Medicine

## 2018-08-09 ENCOUNTER — Emergency Department (HOSPITAL_COMMUNITY): Payer: Medicare Other

## 2018-08-09 ENCOUNTER — Emergency Department (HOSPITAL_COMMUNITY)
Admission: EM | Admit: 2018-08-09 | Discharge: 2018-08-10 | Disposition: A | Discharge status: Home / Self Care | Payer: Medicare Other | Source: Home / Self Care | Attending: Emergency Medicine | Admitting: Emergency Medicine

## 2018-08-09 ENCOUNTER — Encounter (HOSPITAL_COMMUNITY): Payer: Self-pay | Admitting: *Deleted

## 2018-08-09 ENCOUNTER — Encounter (HOSPITAL_COMMUNITY): Payer: Self-pay | Admitting: Emergency Medicine

## 2018-08-09 ENCOUNTER — Other Ambulatory Visit: Payer: Self-pay

## 2018-08-09 DIAGNOSIS — Z86718 Personal history of other venous thrombosis and embolism: Secondary | ICD-10-CM | POA: Insufficient documentation

## 2018-08-09 DIAGNOSIS — Z87891 Personal history of nicotine dependence: Secondary | ICD-10-CM | POA: Insufficient documentation

## 2018-08-09 DIAGNOSIS — Z79899 Other long term (current) drug therapy: Secondary | ICD-10-CM | POA: Insufficient documentation

## 2018-08-09 DIAGNOSIS — Z85038 Personal history of other malignant neoplasm of large intestine: Secondary | ICD-10-CM | POA: Diagnosis not present

## 2018-08-09 DIAGNOSIS — I10 Essential (primary) hypertension: Secondary | ICD-10-CM | POA: Insufficient documentation

## 2018-08-09 DIAGNOSIS — Z7982 Long term (current) use of aspirin: Secondary | ICD-10-CM

## 2018-08-09 DIAGNOSIS — R310 Gross hematuria: Secondary | ICD-10-CM

## 2018-08-09 DIAGNOSIS — N320 Bladder-neck obstruction: Secondary | ICD-10-CM

## 2018-08-09 DIAGNOSIS — Z8546 Personal history of malignant neoplasm of prostate: Secondary | ICD-10-CM | POA: Insufficient documentation

## 2018-08-09 DIAGNOSIS — N1339 Other hydronephrosis: Secondary | ICD-10-CM | POA: Diagnosis not present

## 2018-08-09 LAB — CBC WITH DIFFERENTIAL/PLATELET
Abs Immature Granulocytes: 0.03 10*3/uL (ref 0.00–0.07)
Basophils Absolute: 0 10*3/uL (ref 0.0–0.1)
Basophils Relative: 1 %
Eosinophils Absolute: 0.3 10*3/uL (ref 0.0–0.5)
Eosinophils Relative: 3 %
HCT: 35 % — ABNORMAL LOW (ref 39.0–52.0)
Hemoglobin: 11.3 g/dL — ABNORMAL LOW (ref 13.0–17.0)
Immature Granulocytes: 0 %
Lymphocytes Relative: 13 %
Lymphs Abs: 1 10*3/uL (ref 0.7–4.0)
MCH: 31.8 pg (ref 26.0–34.0)
MCHC: 32.3 g/dL (ref 30.0–36.0)
MCV: 98.6 fL (ref 80.0–100.0)
Monocytes Absolute: 0.5 10*3/uL (ref 0.1–1.0)
Monocytes Relative: 7 %
Neutro Abs: 5.7 10*3/uL (ref 1.7–7.7)
Neutrophils Relative %: 76 %
Platelets: 169 10*3/uL (ref 150–400)
RBC: 3.55 MIL/uL — ABNORMAL LOW (ref 4.22–5.81)
RDW: 15 % (ref 11.5–15.5)
WBC: 7.5 10*3/uL (ref 4.0–10.5)
nRBC: 0 % (ref 0.0–0.2)

## 2018-08-09 LAB — BASIC METABOLIC PANEL
Anion gap: 7 (ref 5–15)
BUN: 34 mg/dL — ABNORMAL HIGH (ref 8–23)
CO2: 28 mmol/L (ref 22–32)
Calcium: 9.4 mg/dL (ref 8.9–10.3)
Chloride: 105 mmol/L (ref 98–111)
Creatinine, Ser: 1.38 mg/dL — ABNORMAL HIGH (ref 0.61–1.24)
GFR calc Af Amer: 53 mL/min — ABNORMAL LOW (ref >60)
GFR calc non Af Amer: 45 mL/min — ABNORMAL LOW (ref >60)
Glucose, Bld: 102 mg/dL — ABNORMAL HIGH (ref 70–99)
Potassium: 3.6 mmol/L (ref 3.5–5.1)
Sodium: 140 mmol/L (ref 135–145)

## 2018-08-09 LAB — URINALYSIS, ROUTINE W REFLEX MICROSCOPIC
Bilirubin Urine: NEGATIVE
Glucose, UA: NEGATIVE mg/dL
KETONES UR: NEGATIVE mg/dL
Leukocytes, UA: NEGATIVE
NITRITE: NEGATIVE
PH: 6 (ref 5.0–8.0)
Protein, ur: 100 mg/dL — AB
Specific Gravity, Urine: 1.014 (ref 1.005–1.030)

## 2018-08-09 MED ORDER — IBUPROFEN 400 MG PO TABS
400.0000 mg | ORAL_TABLET | Freq: Once | ORAL | Status: DC
  Filled 2018-08-09: qty 1

## 2018-08-09 MED ORDER — ACETAMINOPHEN 325 MG PO TABS
650.0000 mg | ORAL_TABLET | Freq: Once | ORAL | Status: AC
  Administered 2018-08-09: 650 mg via ORAL
  Filled 2018-08-09: qty 2

## 2018-08-09 NOTE — ED Notes (Addendum)
Patient did not answer when called for vitals reassess.  When this RN became aware of no answer, and critical results from CT, attempted to call patient at home number in snapshot.  No answer.  Will attempt again shortly.

## 2018-08-09 NOTE — ED Notes (Signed)
Results reviewed, will discuss with ordering provider

## 2018-08-09 NOTE — ED Triage Notes (Signed)
Bleeding from his penis continues

## 2018-08-09 NOTE — ED Notes (Signed)
Patient verbalized understanding of discharge instructions, no questions. Discharge instructions given to wife over phone with patient permission. Patient out of ED via wheelchair in no distress.

## 2018-08-09 NOTE — ED Notes (Signed)
Will prioritize rooming with patient and upgrade acuity

## 2018-08-09 NOTE — ED Triage Notes (Signed)
The  Pt is very hard of hearing but he did not bring his =hearing aid with him

## 2018-08-09 NOTE — ED Notes (Signed)
pts pants removed he had on a large pad that was covered in blood  Less pain than when he checked in [place]

## 2018-08-09 NOTE — ED Provider Notes (Signed)
[facility] EMERGENCY DEPARTMENT Provider Note   CSN: [id number] Arrival date & time: 08/09/18  1650     History   Chief Complaint No chief complaint on file.   HPI [name] is a 84 y.o. male.  HPI Patient presents with blood in the urine.  Seen at [facility] long earlier today for the same.  Labs reviewed.  Had continued urination.  However with pain in his penis and now decreased urination came to the ER.  Screened and had CT scan done that showed bladder outlet obstruction.  States he is having decreased output now.  No lightheadedness or dizziness.  States he has an appointment to see Dr. Gloriann Loan from urology on Monday with today being Friday.  He is not on blood thinners. Past Medical History:  Diagnosis Date  . Adenocarcinoma of colon ([place])   . Diverticulitis   . Gout    no recent flare  . Hemorrhoids   . History of blood transfusion   . Hypertension   . Incontinence   . Peripheral vascular disease ([place]) 2011   DVT right leg  . Prostate carcinoma [facility])     Patient Active Problem List   Diagnosis Date Noted  .  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 Colon cancer ([place]) 12/15/2013  . Acute lower GI bleeding 11/14/2013  . History of pulmonary embolism 11/14/2013  . LOCALIZED SUPERFICIAL SWELLING MASS OR LUMP 09/30/2010  . DIVERTICULAR DISEASE 06/21/2009  . ACNE ROSACEA 05/12/2009  . ARTHRALGIA 05/12/2009  . Gout, unspecified 05/11/2008  . HYPERTENSION 05/11/2008  . KIDNEY DISEASE, CHRONIC NOS 05/11/2008  . COUGH 04/17/2007  . ESOPHAGEAL STRICTURE 01/23/2007  . ADENOCARCINOMA, PROSTATE, HX OF 01/23/2007    Past Surgical History:  Procedure Laterality Date  . COLON SURGERY    . COLONOSCOPY N/A 11/17/2013   Procedure: COLONOSCOPY;  Surgeon: Jeryl Columbia, MD;  Location: [facility] ENDOSCOPY;  Service: Endoscopy;  Laterality: N/A;  . diverticulitis    . EYE SURGERY Bilateral    cataract extraction with IOL  . HERNIA  REPAIR  1999  . HOT HEMOSTASIS N/A 11/17/2013   Procedure: HOT HEMOSTASIS (ARGON PLASMA COAGULATION/BICAP);  Surgeon: Jeryl Columbia, MD;  Location: [facility];  Service: Endoscopy;  Laterality: N/A;  . ILEOSTOMY    . LAPAROSCOPIC LYSIS OF ADHESIONS N/A 01/06/2014   Procedure: LAPAROSCOPIC LYSIS OF ADHESIONS;  Surgeon: Odis Hollingshead, MD;  Location: [facility] ORS;  Service: General;  Laterality: N/A;  . LAPAROSCOPIC PARTIAL COLECTOMY N/A 01/06/2014   Procedure: LAPAROSCOPIC ASSISTED PARTIAL COLECTOMY;  Surgeon: Odis Hollingshead, MD;  Location: [facility] ORS;  Service: General;  Laterality: N/A;  . [place]  . TONSILLECTOMY          Home Medications    Prior to Admission medications   Medication Sig Start Date End Date Taking? Authorizing Provider  allopurinol (ZYLOPRIM) 300 MG tablet Take 300 mg by mouth daily.     Yes [provider]  aspirin EC 81 MG tablet Take 81 mg by mouth daily.   Yes [provider]  carvedilol (COREG) 6.25 MG tablet Take 6.25 mg by mouth 2 (two) times daily with a meal.     Yes [provider]  hydrochlorothiazide (HYDRODIURIL) 50 MG tablet Take 50 mg by mouth daily.    Yes [provider]  loperamide (IMODIUM) 2 MG capsule Take 2 mg by mouth as needed for diarrhea or loose stools.   Yes  [provider]  Multiple Vitamin (MULTIVITAMIN WITH MINERALS) TABS tablet Take 1 tablet by mouth daily.   Yes [provider]  potassium chloride SA (K-DUR,KLOR-CON) 20 MEQ tablet Take 20 mEq by mouth daily. 07/13/18  Yes [provider]    Family History No family history on file.  Social History Social History   Tobacco Use  . Smoking status: Former Smoker    Types: Cigarettes, Pipe    Last attempt to quit: 01/02/1980    Years since quitting: 38.6  . Smokeless tobacco: Never Used  Substance Use Topics  . Alcohol use: No  . Drug use: No     Allergies   Sulfa antibiotics and Sulfonamide  derivatives   Review of Systems Review of Systems  Constitutional: Negative for appetite change.  HENT: Negative for congestion.   Cardiovascular: Negative for chest pain.  Gastrointestinal: Positive for abdominal pain.  Genitourinary: Positive for flank pain and hematuria.  Skin: Negative for rash.  Hematological: Bruises/bleeds easily.  Psychiatric/Behavioral: Negative for confusion.     Physical Exam Updated Vital Signs BP 132/67   Pulse 95   Temp 98.8 F (37.1 C)   Resp 18   Wt 79.4 kg   SpO2 99%   BMI 25.12 kg/m   Physical Exam  Constitutional: He appears well-developed.  Eyes: Pupils are equal, round, and reactive to light.  Neck: Neck supple.  Cardiovascular: Normal rate.  Pulmonary/Chest: Effort normal.  Abdominal:  Suprapubic tenderness.  Genitourinary:  Genitourinary Comments: Small amount of blood at meatus  Neurological: He is alert.  Skin: Skin is warm. Capillary refill takes less than 2 seconds.     ED Treatments / Results  Labs (all labs ordered are listed, but only abnormal results are displayed) Labs Reviewed - No data to display  EKG None  Radiology Ct Renal Stone Study  Result Date: 08/09/2018 CLINICAL DATA:  Increasing dysuria and hematuria that began this morning EXAM: CT ABDOMEN AND PELVIS WITHOUT CONTRAST TECHNIQUE: Multidetector CT imaging of the abdomen and pelvis was performed following the standard protocol without IV contrast. COMPARISON:  02/15/2018 FINDINGS: Lower chest:  No acute finding. Hepatobiliary: No focal liver abnormality.Small volume layering calcified gallstones. Pancreas: Unremarkable. Spleen: Unremarkable. Adrenals/Urinary Tract: Negative adrenals. Mild bilateral hydroureteronephrosis above a distended urinary bladder that contains wispy high-density material at the base. This material is new from prior and most likely blood clot. Right renal cystic intensities that are stable. Stomach/Bowel: Partial right colectomy.  Left colonic diverticulosis. No inflammatory changes or obstruction Vascular/Lymphatic: Atherosclerotic calcification. No mass or adenopathy. Reproductive:Prostatectomy. Other: No ascites or pneumoperitoneum. Probable prior right lower quadrant ostomy with mild bulging of fat and small bowel into the wall defect. Musculoskeletal: Danced disc and facet degeneration. Lower thoracic spondylosis with bridging osteophytes. IMPRESSION: 1. Dilated bladder causing bilateral hydronephrosis. There is high-density luminal material at the bladder base, likely obstructing blood clot. No definitive source of hemorrhage. 2. Cholelithiasis. 3. Prostatectomy and partial right colectomy. Electronically Signed   By: Monte Fantasia M.D.   On: 08/09/2018 20:06    Procedures Procedures (including critical care time)  Medications Ordered in ED Medications  acetaminophen (TYLENOL) tablet 650 mg (650 mg Oral Given 08/09/18 1939)     Initial Impression / Assessment and Plan / ED Course  I have reviewed the triage vital signs and the nursing notes.  Pertinent labs & imaging results that were available during my care of the patient were reviewed by me and considered in my medical decision making (see  chart for details).     Patient with hematuria.  Bladder outlet obstruction on CT.  Labs from earlier reviewed.  Foley catheter placed.  Relieved obstruction.  However likely still has some clots in the bladder.  Has urology follow-up in 3 days.  Will discharge home with catheter.  If continued bleeding and lightheadedness or dizziness will follow-up sooner.  Discharge for  Final Clinical Impressions(s) / ED Diagnoses   Final diagnoses:  Gross hematuria  Bladder outlet obstruction    ED Discharge Orders    None       Davonna Belling, MD 08/09/18 2353

## 2018-08-09 NOTE — ED Provider Notes (Signed)
Patient placed in Quick Look pathway, seen and evaluated   Chief Complaint: Hematuria  HPI:   Patient notes hematuria and dysuria.  Was seen at [facility], ED and sent home with an appointment with urology on Monday but states his dysuria has worsened.  Denies flank pain, nausea, vomiting, fevers.  ROS: +hematuria, dysuria  Physical Exam:   Gen: No distress  Neuro: Awake and Alert  Skin: Warm    Focused Exam: Abdomen soft and nontender.  No CVA tenderness.  Active bowel sounds.   Initiation of care has begun. The patient has been counseled on the process, plan, and necessity for staying for the completion/evaluation, and the remainder of the medical screening examination    Debroah Baller 08/09/18 1804    Margette Fast, MD 08/10/18 [phone]

## 2018-08-09 NOTE — ED Notes (Signed)
Pt reports pain 10/10. Requesting medication.

## 2018-08-09 NOTE — ED Triage Notes (Signed)
Bloody urine since this am  He was seen at [facility] long ed and was rel;eased .Marland Kitchen So he came here when he began to have pain and continued to bleed

## 2018-08-09 NOTE — Discharge Instructions (Addendum)
He will need to follow-up with your urologist or the one provided.  Call them today to set up an appointment as soon as possible.  Increase your fluid intake.

## 2018-08-09 NOTE — ED Provider Notes (Signed)
[place] DEPT Provider Note   CSN: [id number] Arrival date & time: 08/09/18  1884     History   Chief Complaint Chief Complaint  Patient presents with  . Hematuria    HPI [name] is a 84 y.o. male.  HPI  Patient presents to the emergency department with hematuria that he noted at 5 AM when he got up to go to the bathroom.  The patient states that he has not had this issue in the past.  Patient states he did have prostate cancer several years back.  Patient states that he is not on any blood thinners.  The patient states he does not have any pain patient denies any other symptoms at this time.  Patient states that he did not have any difficulty with urination.  He states that the urine was mostly just blood.  The patient denies chest pain, shortness of breath, headache,blurred vision, neck pain, fever, cough, weakness, numbness, dizziness, anorexia, edema, abdominal pain, nausea, vomiting, diarrhea, rash, back pain, dysuria, hematemesis, bloody stool, near syncope, or syncope. Past Medical History:  Diagnosis Date  . Adenocarcinoma of colon ([place])   . Diverticulitis   . Gout    no recent flare  . Hemorrhoids   . History of blood transfusion   . Hypertension   . Incontinence   . Peripheral vascular disease ([place]) 2011   DVT right leg  . Prostate carcinoma [facility])     Patient Active Problem List   Diagnosis Date Noted  .  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 Colon cancer ([place]) 12/15/2013  . Acute lower GI bleeding 11/14/2013  . History of pulmonary embolism 11/14/2013  . LOCALIZED SUPERFICIAL SWELLING MASS OR LUMP 09/30/2010  . DIVERTICULAR DISEASE 06/21/2009  . ACNE ROSACEA 05/12/2009  . ARTHRALGIA 05/12/2009  . Gout, unspecified 05/11/2008  . HYPERTENSION 05/11/2008  . KIDNEY DISEASE, CHRONIC NOS 05/11/2008  . COUGH 04/17/2007  . ESOPHAGEAL STRICTURE 01/23/2007  . ADENOCARCINOMA, PROSTATE, HX OF  01/23/2007    Past Surgical History:  Procedure Laterality Date  . COLON SURGERY    . COLONOSCOPY N/A 11/17/2013   Procedure: COLONOSCOPY;  Surgeon: Jeryl Columbia, MD;  Location: [facility] ENDOSCOPY;  Service: Endoscopy;  Laterality: N/A;  . diverticulitis    . EYE SURGERY Bilateral    cataract extraction with IOL  . HERNIA REPAIR  1999  . HOT HEMOSTASIS N/A 11/17/2013   Procedure: HOT HEMOSTASIS (ARGON PLASMA COAGULATION/BICAP);  Surgeon: Jeryl Columbia, MD;  Location: [facility];  Service: Endoscopy;  Laterality: N/A;  . ILEOSTOMY    . LAPAROSCOPIC LYSIS OF ADHESIONS N/A 01/06/2014   Procedure: LAPAROSCOPIC LYSIS OF ADHESIONS;  Surgeon: Odis Hollingshead, MD;  Location: [facility] ORS;  Service: General;  Laterality: N/A;  . LAPAROSCOPIC PARTIAL COLECTOMY N/A 01/06/2014   Procedure: LAPAROSCOPIC ASSISTED PARTIAL COLECTOMY;  Surgeon: Odis Hollingshead, MD;  Location: [facility] ORS;  Service: General;  Laterality: N/A;  . [place]  . TONSILLECTOMY          Home Medications    Prior to Admission medications   Medication Sig Start Date End Date Taking? Authorizing Provider  allopurinol (ZYLOPRIM) 300 MG tablet Take 300 mg by mouth daily.     Yes [provider]  aspirin EC 81 MG tablet Take 81 mg by mouth daily.   Yes [provider]  carvedilol (COREG) 6.25 MG tablet Take 6.25 mg by mouth 2 (two) times daily  with a meal.     Yes [provider]  hydrochlorothiazide (HYDRODIURIL) 50 MG tablet Take 50 mg by mouth every morning.    Yes [provider]  loperamide (IMODIUM) 2 MG capsule Take 2 mg by mouth as needed for diarrhea or loose stools.   Yes [provider]  Multiple Vitamin (MULTIVITAMIN WITH MINERALS) TABS tablet Take 1 tablet by mouth daily.   Yes [provider]  potassium chloride SA (K-DUR,KLOR-CON) 20 MEQ tablet Take 20 mEq by mouth daily. 07/13/18  Yes [provider]  ciprofloxacin (CIPRO) 500 MG tablet Take 1 tablet  (500 mg total) by mouth 2 (two) times daily. Patient not taking: Reported on 42/59/5638 7/56/43   Delora Fuel, MD  metroNIDAZOLE (FLAGYL) 500 MG tablet Take 1 tablet (500 mg total) by mouth 3 (three) times daily. Patient not taking: Reported on 32/95/1884 1/66/06   Delora Fuel, MD    Family History No family history on file.  Social History Social History   Tobacco Use  . Smoking status: Former Smoker    Types: Cigarettes, Pipe    Last attempt to quit: 01/02/1980    Years since quitting: 38.6  . Smokeless tobacco: Never Used  Substance Use Topics  . Alcohol use: No  . Drug use: No     Allergies   Sulfa antibiotics and Sulfonamide derivatives   Review of Systems Review of Systems   All other systems negative except as documented in the HPI. All pertinent positives and negatives as reviewed in the HPI.  Physical Exam Updated Vital Signs BP 134/84   Pulse 65   Temp 97.7 F (36.5 C) (Oral)   Resp 15   SpO2 100%   Physical Exam  Constitutional: He is oriented to person, place, and time. He appears well-developed and well-nourished. No distress.  HENT:  Head: Normocephalic and atraumatic.  Mouth/Throat: Oropharynx is clear and moist.  Eyes: Pupils are equal, round, and reactive to light.  Neck: Normal range of motion. Neck supple.  Cardiovascular: Normal rate, regular rhythm and normal heart sounds. Exam reveals no gallop and no friction rub.  No murmur heard. Pulmonary/Chest: Effort normal and breath sounds normal. No respiratory distress. He has no wheezes.  Abdominal: Soft. Bowel sounds are normal. He exhibits no distension and no mass. There is no tenderness. There is no rebound and no guarding.  Genitourinary: Penis normal.  Neurological: He is alert and oriented to person, place, and time. He exhibits normal muscle tone. Coordination normal.  Skin: Skin is warm and dry. Capillary refill takes less than 2 seconds. No rash noted. No erythema.  Psychiatric: He  has a normal mood and affect. His behavior is normal.  Nursing note and vitals reviewed.    ED Treatments / Results  Labs (all labs ordered are listed, but only abnormal results are displayed) Labs Reviewed  URINALYSIS, ROUTINE W REFLEX MICROSCOPIC - Abnormal; Notable for the following components:      Result Value   Color, Urine RED (*)    APPearance CLOUDY (*)    Hgb urine dipstick LARGE (*)    Protein, ur 100 (*)    RBC / HPF >50 (*)    WBC, UA >50 (*)    Bacteria, UA RARE (*)    All other components within normal limits  CBC WITH DIFFERENTIAL/PLATELET - Abnormal; Notable for the following components:   RBC 3.55 (*)    Hemoglobin 11.3 (*)    HCT 35.0 (*)    All  other components within normal limits  BASIC METABOLIC PANEL - Abnormal; Notable for the following components:   Glucose, Bld 102 (*)    BUN 34 (*)    Creatinine, Ser 1.38 (*)    GFR calc non Af Amer 45 (*)    GFR calc Af Amer 53 (*)    All other components within normal limits  URINE CULTURE    EKG None  Radiology No results found.  Procedures Procedures (including critical care time)  Medications Ordered in ED Medications - No data to display   Initial Impression / Assessment and Plan / ED Course  I have reviewed the triage vital signs and the nursing notes.  Pertinent labs & imaging results that were available during my care of the patient were reviewed by me and considered in my medical decision making (see chart for details).     With the patient's lack of symptoms I feel that we will have him follow-up with urology.  Patient is advised to return here as needed.  His laboratory testing does not show any acute abnormality at this time.  Patient advised to return here for any worsening in his condition.  Final Clinical Impressions(s) / ED Diagnoses   Final diagnoses:  None    ED Discharge Orders    None       Dalia Heading, PA-C 08/09/18 0848    Jola Schmidt, MD 08/09/18  [phone]

## 2018-08-09 NOTE — ED Triage Notes (Addendum)
Pt from home with c/o hematuria that began this morning. Pt denies pain. Pt had hx of prostate cancer in 1990 and had his prosate removed. Pt is hard of hearing and does not have his hearing aid in place

## 2018-08-09 NOTE — Discharge Instructions (Addendum)
If the Foley catheter plugs up go to [facility] or call urology.  If the bleeding continues to become more lightheaded or dizzy follow-up sooner than Monday.

## 2018-08-10 LAB — URINE CULTURE: Culture: NO GROWTH

## 2018-08-12 ENCOUNTER — Other Ambulatory Visit: Payer: Self-pay | Admitting: Urology

## 2018-08-12 DIAGNOSIS — N393 Stress incontinence (female) (male): Secondary | ICD-10-CM | POA: Diagnosis not present

## 2018-08-12 DIAGNOSIS — R310 Gross hematuria: Secondary | ICD-10-CM | POA: Diagnosis not present

## 2018-08-12 DIAGNOSIS — C61 Malignant neoplasm of prostate: Secondary | ICD-10-CM | POA: Diagnosis not present

## 2018-08-13 ENCOUNTER — Other Ambulatory Visit: Payer: Self-pay

## 2018-08-13 ENCOUNTER — Encounter (HOSPITAL_COMMUNITY): Payer: Self-pay | Admitting: *Deleted

## 2018-08-13 ENCOUNTER — Encounter: Payer: Self-pay | Admitting: *Deleted

## 2018-08-13 DIAGNOSIS — R339 Retention of urine, unspecified: Secondary | ICD-10-CM | POA: Diagnosis not present

## 2018-08-13 NOTE — Congregational Nurse Program (Signed)
[id number] has urinary retention has not been able to pass urine since last night now having abd. PAIN, instructed patient to go directly to the [facility]-Dr. Gloriann Loan and I would call ahead, pt was planning on going to the er but ed at [facility] is full.  TCT-Bell-office spoke with cindy-informed of status and that patient should be arriving soon.

## 2018-08-16 ENCOUNTER — Encounter (HOSPITAL_COMMUNITY): Payer: Self-pay | Admitting: *Deleted

## 2018-08-16 ENCOUNTER — Encounter (HOSPITAL_COMMUNITY)
Admission: RE | Disposition: A | Discharge status: Home / Self Care | Payer: Self-pay | Source: Ambulatory Visit | Attending: Urology

## 2018-08-16 ENCOUNTER — Other Ambulatory Visit: Payer: Self-pay

## 2018-08-16 ENCOUNTER — Ambulatory Visit (HOSPITAL_COMMUNITY): Payer: Medicare Other | Admitting: Anesthesiology

## 2018-08-16 ENCOUNTER — Ambulatory Visit (HOSPITAL_COMMUNITY)
Admission: RE | Admit: 2018-08-16 | Discharge: 2018-08-16 | Disposition: A | Discharge status: Home / Self Care | Payer: Medicare Other | Source: Ambulatory Visit | Attending: Urology | Admitting: Urology

## 2018-08-16 ENCOUNTER — Ambulatory Visit (HOSPITAL_COMMUNITY): Payer: Medicare Other

## 2018-08-16 DIAGNOSIS — Z87891 Personal history of nicotine dependence: Secondary | ICD-10-CM | POA: Insufficient documentation

## 2018-08-16 DIAGNOSIS — N393 Stress incontinence (female) (male): Secondary | ICD-10-CM | POA: Insufficient documentation

## 2018-08-16 DIAGNOSIS — N189 Chronic kidney disease, unspecified: Secondary | ICD-10-CM | POA: Insufficient documentation

## 2018-08-16 DIAGNOSIS — C670 Malignant neoplasm of trigone of bladder: Secondary | ICD-10-CM | POA: Insufficient documentation

## 2018-08-16 DIAGNOSIS — D494 Neoplasm of unspecified behavior of bladder: Secondary | ICD-10-CM | POA: Diagnosis not present

## 2018-08-16 DIAGNOSIS — I129 Hypertensive chronic kidney disease with stage 1 through stage 4 chronic kidney disease, or unspecified chronic kidney disease: Secondary | ICD-10-CM | POA: Diagnosis not present

## 2018-08-16 DIAGNOSIS — Z79899 Other long term (current) drug therapy: Secondary | ICD-10-CM | POA: Insufficient documentation

## 2018-08-16 DIAGNOSIS — Z923 Personal history of irradiation: Secondary | ICD-10-CM | POA: Insufficient documentation

## 2018-08-16 DIAGNOSIS — I10 Essential (primary) hypertension: Secondary | ICD-10-CM | POA: Diagnosis not present

## 2018-08-16 DIAGNOSIS — Z882 Allergy status to sulfonamides status: Secondary | ICD-10-CM | POA: Diagnosis not present

## 2018-08-16 DIAGNOSIS — R310 Gross hematuria: Secondary | ICD-10-CM | POA: Diagnosis present

## 2018-08-16 DIAGNOSIS — N320 Bladder-neck obstruction: Secondary | ICD-10-CM | POA: Diagnosis not present

## 2018-08-16 DIAGNOSIS — M1990 Unspecified osteoarthritis, unspecified site: Secondary | ICD-10-CM | POA: Diagnosis not present

## 2018-08-16 DIAGNOSIS — R338 Other retention of urine: Secondary | ICD-10-CM | POA: Insufficient documentation

## 2018-08-16 DIAGNOSIS — I739 Peripheral vascular disease, unspecified: Secondary | ICD-10-CM | POA: Insufficient documentation

## 2018-08-16 DIAGNOSIS — C679 Malignant neoplasm of bladder, unspecified: Secondary | ICD-10-CM | POA: Diagnosis not present

## 2018-08-16 DIAGNOSIS — Z8546 Personal history of malignant neoplasm of prostate: Secondary | ICD-10-CM | POA: Diagnosis not present

## 2018-08-16 DIAGNOSIS — Z7982 Long term (current) use of aspirin: Secondary | ICD-10-CM | POA: Insufficient documentation

## 2018-08-16 HISTORY — DX: Presence of dental prosthetic device (complete) (partial): Z97.2

## 2018-08-16 HISTORY — DX: Presence of spectacles and contact lenses: Z97.3

## 2018-08-16 HISTORY — DX: Unspecified osteoarthritis, unspecified site: M19.90

## 2018-08-16 HISTORY — PX: TRANSURETHRAL RESECTION OF BLADDER TUMOR: SHX2575

## 2018-08-16 HISTORY — PX: CYSTOSCOPY W/ RETROGRADES: SHX1426

## 2018-08-16 SURGERY — TURBT (TRANSURETHRAL RESECTION OF BLADDER TUMOR)
Anesthesia: General

## 2018-08-16 MED ORDER — PROMETHAZINE HCL 25 MG/ML IJ SOLN: 6.2500 mg | INTRAMUSCULAR | Status: DC | PRN

## 2018-08-16 MED ORDER — LACTATED RINGERS IV SOLN
INTRAVENOUS | Status: DC
  Administered 2018-08-16: 12:00:00 via INTRAVENOUS

## 2018-08-16 MED ORDER — LIDOCAINE 2% (20 MG/ML) 5 ML SYRINGE
INTRAMUSCULAR | Status: DC | PRN
  Administered 2018-08-16: 40 mg via INTRAVENOUS

## 2018-08-16 MED ORDER — SODIUM CHLORIDE 0.9 % IR SOLN
Status: DC | PRN
  Administered 2018-08-16: 6000 mL

## 2018-08-16 MED ORDER — FENTANYL CITRATE (PF) 100 MCG/2ML IJ SOLN
INTRAMUSCULAR | Status: AC
  Filled 2018-08-16: qty 2

## 2018-08-16 MED ORDER — PROPOFOL 10 MG/ML IV BOLUS
INTRAVENOUS | Status: AC
  Filled 2018-08-16: qty 20

## 2018-08-16 MED ORDER — STERILE WATER FOR IRRIGATION IR SOLN
Status: DC | PRN
  Administered 2018-08-16: 500 mL

## 2018-08-16 MED ORDER — FENTANYL CITRATE (PF) 100 MCG/2ML IJ SOLN
INTRAMUSCULAR | Status: DC | PRN
  Administered 2018-08-16: 75 ug via INTRAVENOUS
  Administered 2018-08-16: 25 ug via INTRAVENOUS

## 2018-08-16 MED ORDER — PROPOFOL 10 MG/ML IV BOLUS
INTRAVENOUS | Status: DC | PRN
  Administered 2018-08-16: 120 mg via INTRAVENOUS

## 2018-08-16 MED ORDER — CEFAZOLIN SODIUM-DEXTROSE 2-4 GM/100ML-% IV SOLN
2.0000 g | INTRAVENOUS | Status: AC
  Administered 2018-08-16: 2 g via INTRAVENOUS
  Filled 2018-08-16: qty 100

## 2018-08-16 MED ORDER — HYDROCODONE-ACETAMINOPHEN 5-325 MG PO TABS: 1.0000 | ORAL_TABLET | ORAL | 0 refills | Status: DC | PRN

## 2018-08-16 MED ORDER — ONDANSETRON HCL 4 MG/2ML IJ SOLN
INTRAMUSCULAR | Status: DC | PRN
  Administered 2018-08-16: 4 mg via INTRAVENOUS

## 2018-08-16 MED ORDER — SODIUM CHLORIDE 0.9 % IV SOLN
INTRAVENOUS | Status: DC | PRN
  Administered 2018-08-16: 19 mL

## 2018-08-16 MED ORDER — SODIUM CHLORIDE 0.9 % IV SOLN
INTRAVENOUS | Status: DC | PRN
  Administered 2018-08-16: 25 ug/min via INTRAVENOUS

## 2018-08-16 MED ORDER — FENTANYL CITRATE (PF) 100 MCG/2ML IJ SOLN
25.0000 ug | INTRAMUSCULAR | Status: DC | PRN
  Administered 2018-08-16: 50 ug via INTRAVENOUS

## 2018-08-16 SURGICAL SUPPLY — 19 items
BAG URINE DRAINAGE (UROLOGICAL SUPPLIES) ×4 IMPLANT
BAG URO CATCHER STRL LF (MISCELLANEOUS) ×4 IMPLANT
CATH FOLEY 2WAY SLVR  5CC 20FR (CATHETERS) ×2
CATH FOLEY 2WAY SLVR 5CC 20FR (CATHETERS) ×2 IMPLANT
CATH INTERMIT  6FR 70CM (CATHETERS) ×4 IMPLANT
CLOTH BEACON ORANGE TIMEOUT ST (SAFETY) ×4 IMPLANT
COVER WAND RF STERILE (DRAPES) IMPLANT
ELECT REM PT RETURN 15FT ADLT (MISCELLANEOUS) ×4 IMPLANT
GLOVE BIO SURGEON STRL SZ7.5 (GLOVE) ×4 IMPLANT
GOWN STRL REUS W/TWL LRG LVL3 (GOWN DISPOSABLE) ×8 IMPLANT
GUIDEWIRE STR DUAL SENSOR (WIRE) ×4 IMPLANT
LOOP CUT BIPOLAR 24F LRG (ELECTROSURGICAL) ×4 IMPLANT
MANIFOLD NEPTUNE II (INSTRUMENTS) ×4 IMPLANT
PACK CYSTO (CUSTOM PROCEDURE TRAY) ×4 IMPLANT
SET ASPIRATION TUBING (TUBING) IMPLANT
SYRINGE IRR TOOMEY STRL 70CC (SYRINGE) IMPLANT
TUBING CONNECTING 10 (TUBING) ×3 IMPLANT
TUBING CONNECTING 10' (TUBING) ×1
TUBING UROLOGY SET (TUBING) ×4 IMPLANT

## 2018-08-16 NOTE — Anesthesia Postprocedure Evaluation (Signed)
Anesthesia Post Note  Patient: [name]  Procedure(s) Performed: TRANSURETHRAL RESECTION OF BLADDER TUMOR (TURBT) (N/A ) CYSTOSCOPY WITH BILATERAL  RETROGRADE PYELOGRAM (Bilateral )     Patient location during evaluation: PACU Anesthesia Type: General Level of consciousness: awake and alert Pain management: pain level controlled Vital Signs Assessment: post-procedure vital signs reviewed and stable Respiratory status: spontaneous breathing, nonlabored ventilation, respiratory function stable and patient connected to nasal cannula oxygen Cardiovascular status: blood pressure returned to baseline and stable Postop Assessment: no apparent nausea or vomiting Anesthetic complications: no    Last Vitals:  Vitals:   08/16/18 1530 08/16/18 1541  BP:    Pulse: 66 69  Resp: 14 16  Temp: (!) 36.3 C   SpO2: 100% 100%    Last Pain:  Vitals:   08/16/18 1530  TempSrc:   PainSc: 4                  Tiajuana Amass

## 2018-08-16 NOTE — H&P (Signed)
CC/HPI: Cc: Gross hematuria, history of prostate cancer  HPI:  83:  84 YO male patient of Dr. Arlyn Leak seen today for a 6 month office visit.    H/o with (probable) biochemical recurrent prostate cancer. He is status post radical retropubic prostatectomy in 1992, followed by external beam radiation therapy in 2002. However, in 2004, his PSA began to rise from 0.04 to 1.58, to 2.68 in 2009, to 3.04 in 2010. June 2011 PSA 2.74. PSA on 12/09/10 of 2.12.    He has a normal DEXA scan and normal vitamin D level of 38. The patient has had post prostatectomy stress urinary incontinence which was greatly aided by Sudafed which he no longer uses. He also does Kegals. He has cough, laugh, sneeze incontinence, with 2 pad incontinence when he plays golf.    He is s/p cysto/urethral dilation of stricture on 11/02/09 after Dr. Dalbert Batman wasn't able to place foley for surgery of colostomy 11/02/09. Pt has failed Toviaz. Pt had his colostomy reversed on 06/28/10.    11/01/11 PSA - 2.38    7/13: 2.76.    Interval HX:   Today denies hematuria, dysuria, or change in FOS. Still has urinary incontinence and wears 2 pads/day. States he only took Myrbetriq 50 mg 1 po daily X 2 weeks w/o relief of sxs but does state no change in behavior (drinks numerous sodas/day).    No bone pain or weight loss.    08/12/2018:  Patient is status post radical prostatectomy in 1992. He says currently had a biochemical recurrence that resulted in external beam radiation treatment in 2002. His PSA then increased slowly but stabilized. He has not been seen here since 2015. On Friday, the patient went to the emergency department with gross hematuria. He was subsequently discharged as he was stable. Soon thereafter however he developed urinary retention that prompted another emergency room visit. He had a Foley catheter placed and a CT scan was performed without contrast of the abdomen and pelvis. This revealed urinary retention with  bilateral hydronephrosis as a result. There was high-density material in the bladder consistent with hemorrhage. There is no obvious lymphadenopathy or bone lesions. Patient's hematuria has resolved. At baseline, the patient does have stress urinary incontinence and uses about 2 pads per day.   ALLERGIES: Sulfa Drugs   MEDICATIONS: Allopurinol 100 mg tablet Oral  Aspir 81  Carvedilol 6.25 MG Oral Tablet Oral  Hydrochlorothiazide 50 mg tablet Oral  Loperamide 2 mg tablet  Multivitamins With Minerals  Potassium Chloride 20 MEQ Oral Packet Oral    GU PSH: Cysto Dilate Stricture (M or F) - 2011 Cystoscopy Insert Stent - 2011     PSH Notes: Ileostomy Reversal, Colostomy, Cystoscopy With Insertion Of Ureteral Stent Right, Cystoscopy For Urethral Stricture, Hernia Repair, Radiation Therapy, Prostate Surgery   NON-GU PSH: Colostomy - 2011 Hernia Repair - 2008 Repair Bowel Opening - 2013   GU PMH: Prostate Cancer, Prostate cancer - 2014, Prostate cancer, - 2014 Renal cyst, Renal cyst, acquired - 2014 Urinary incontinence, Unspec, Urinary incontinence - 2014     PMH Notes:  2011-11-01 09:33:42 - Note: 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   Tobacco Use Assessment Completed:  Used Tobacco in last 30 days?  Has never drank.  Drinks 2 caffeinated drinks per day.    Notes: Death In The Family Father, Alcohol Use, Marital History - Currently Married, Occupation:, Death In The Family Mother,  Tobacco Use, Caffeine Use   REVIEW OF SYSTEMS:    GU Review Male:   Patient denies frequent urination, hard to postpone urination, burning/ pain with urination, get up at night to urinate, leakage of urine, stream starts and stops, trouble starting your stream, have to strain to urinate , erection problems, and penile pain.  Gastrointestinal (Upper):   Patient denies nausea, vomiting, and indigestion/ heartburn.  Gastrointestinal (Lower):   Patient denies diarrhea and constipation.  Constitutional:   Patient denies fever, night sweats, weight loss, and fatigue.  Skin:   Patient reports skin rash/ lesion and itching.   Eyes:   Patient denies blurred vision and double vision.  Ears/ Nose/ Throat:   Patient denies sore throat and sinus problems.  Hematologic/Lymphatic:   Patient reports easy bruising. Patient denies swollen glands.  Cardiovascular:   Patient denies leg swelling and chest pains.  Respiratory:   Patient denies cough and shortness of breath.  Endocrine:   Patient denies excessive thirst.  Musculoskeletal:   Patient denies back pain and joint pain.  Neurological:   Patient denies headaches and dizziness.  Psychologic:   Patient denies depression and anxiety.   VITAL SIGNS:      08/12/2018 08:15 AM  Weight 175 lb / 79.38 kg  Height 70 in / 177.8 cm  BP 100/68 mmHg  Heart Rate 84 /min  Temperature 97.7 F / 36.5 C  BMI 25.1 kg/m   GU PHYSICAL EXAMINATION:    Penis: Foley catheter draining clear yellow urine   MULTI-SYSTEM PHYSICAL EXAMINATION:    Constitutional: Elderly male in no acute distress  Respiratory: No labored breathing, no use of accessory muscles.   Cardiovascular: Normal temperature, adequate perfusion of extremities a  Skin: No paleness, no jaundice  Neurologic / Psychiatric: Oriented to time, oriented to place, oriented to person. No depression, no anxiety, no agitation.  Gastrointestinal: No mass, no tenderness, no rigidity, non obese abdomen.  Eyes:  Normal conjunctivae. Normal eyelids.  Musculoskeletal: Normal gait and station of head and neck.    PAST DATA REVIEWED:  Source Of History:  Patient  Records Review:   Previous Patient Records  X-Ray Review: C.T. Abdomen/Pelvis: Reviewed Films. Reviewed Report. Discussed With Patient.    05/01/12 11/01/11 05/19/11 12/09/10 09/26/10 04/11/10 09/08/09 03/03/09  PSA  Total PSA 2.76  2.38  2.49  2.12  2.60  2.74  3.04  2.36    PROCEDURES:         Flexible Cystoscopy - 52000  Risks, benefits, and some of the potential complications of the procedure were discussed at length with the patient including infection, bleeding, voiding discomfort, urinary retention, fever, chills, sepsis, and others. All questions were answered. Informed consent was obtained. Antibiotic prophylaxis was given. Sterile technique and intraurethral analgesia were used.  Meatus:  Normal size. Normal location. Normal condition.  Urethra:  No strictures.  External Sphincter:  Patulous sphincter.  Prostate:  Prostate Surgically Absent.  Bladder Neck:  Non-obstructing.  Ureteral Orifices:  Normal location. Normal size. Normal shape.   Bladder:  Diffuse erythema consistent with radiation and catheter edema. Small bladder tumor adjacent to the right ureteral  orifice just medial to it. No other bladder tumors obvious.      The lower urinary tract was carefully examined. The procedure was well-tolerated and without complications. Antibiotic instructions were given. Instructions were given to call the office immediately for bloody urine, difficulty urinating, urinary retention, painful or frequent urination, fever, chills, nausea, vomiting or other illness. The patient stated that he understood these instructions and would comply with them.   ASSESSMENT:      ICD-10 Details  1 GU:   Bladder tumor/neoplasm - D41.4   2   Prostate Cancer - C61   3   Stress Incontinence - N39.3   4   Gross hematuria - R31.0    PLAN:           Orders Labs PSA         Document Letter(s):  Created for Patient: Clinical Summary        Notes:   PSA today for prostate cancer surveillance   CT scan was negative for any masses. Cystoscopy revealed some erythema secondary to the catheter as well as likely radiation changes. He also had a small bladder tumor and we will proceed with TURBT with bilateral retrograde pyelogram.   CC: Domenick Gong, M.D.   Signed by Link Snuffer, III, M.D. on 08/12/18 at 8:57 AM (EDT

## 2018-08-16 NOTE — Op Note (Signed)
Operative Note  Preoperative diagnosis:  1.  Gross hematuria, bladder tumor small 2.  Urinary retention  Postoperative diagnosis: 1.  Bladder tumor--small, 1 cm 2.  Bladder neck contracture  Procedure(s): 1.  Transurethral resection of bladder tumor-- small 2.  Bilateral retrograde pyelogram 3.  Transurethral resection/incision of bladder neck contracture  Surgeon: Link Snuffer, MD  Assistants: None  Anesthesia: General  Complications: None  EBL: Minimal  Specimens: 1.  Bladder tumor 2.  Bladder neck  Drains/Catheters: 1.  20 French urethral catheter  Intraoperative findings: 1.  Normal anterior urethra 2.  Absent prostate 3.  Bladder neck was tight possibly being the cause of his urinary retention and was wide open after resection with a loop gently at the 6 o'clock position. 4.  Approximately 1 cm bladder tumor at the midline of the trigone.  It was well away from bilateral ureteral orifices.  It was completely resected down to muscle fibers.  There were also multiple areas of erythema consistent with possible radiation changes. 5.  Bilateral retrograde pyelogram without any filling defects or hydronephrosis.  Indication: 84 year old male status post radical retropubic prostatectomy in 1992 with a biochemical recurrence status post external beam radiation therapy in 2002.  PSA increased to 2.12 and had been stable on 11/2010.  He does have a history of bladder neck contracture that was dilated in the past.  He recently went into urinary retention secondary to gross hematuria.  He failed a voiding trial in our clinic.  Cystoscopy revealed a small bladder tumor and a tight bladder neck but the flexible cystoscope was able to get into the bladder.  Description of procedure:  The patient was identified and consent was obtained.  The patient was taken to the operating room and placed in the supine position.  The patient was placed under general anesthesia.  Perioperative  antibiotics were administered.  The patient was placed in dorsal lithotomy.  Patient was prepped and draped in a standard sterile fashion and a timeout was performed.  A 21 French rigid cystoscope was advanced into the urethra and into the bladder.  The bladder neck was noted to be tight but I was able to navigate the scope into the bladder.  An open-ended ureteral catheter was carefully inserted into the distalmost portion of the left ureter and a retrograde pyelogram was performed with the findings noted above.  The right ureteral orifice was then cannulated gently with the open-ended ureteral catheter and a right retrograde pyelogram was performed with the findings noted above.  Given that there were no abnormal findings, I completed a cystoscopy with the findings noted above.  I withdrew the scope and advanced a 72 French resectoscope into the urethra and into the bladder with the visual obturator in place.  The bladder neck again was noted to be tight.  I used the loop on bipolar settings to resect the bladder neck at the 6 o'clock position and collected that for specimen.  I fulgurated the resection bed and there was no active bleeding at the bladder neck.  The bladder neck was then no longer tight and the scope navigated and out easily.  I then turned my attention to the bladder tumor of interest which was resected and collected separately for specimen.  I fulgurated the resection bed as well as surrounding areas of erythema that were consistent with radiation changes.  There was no active bleeding noted and I withdrew the scope and placed a 20 French urethral catheter.  This concluded the  operation.  The patient tolerated the procedure well and was stable postoperatively.  Plan: Follow-up in 1 week for pathology review and for catheter removal.

## 2018-08-16 NOTE — Transfer of Care (Signed)
Immediate Anesthesia Transfer of Care Note  Patient: [name]  Procedure(s) Performed: TRANSURETHRAL RESECTION OF BLADDER TUMOR (TURBT) (N/A ) CYSTOSCOPY WITH BILATERAL  RETROGRADE PYELOGRAM (Bilateral )  Patient Location: PACU  Anesthesia Type:General  Level of Consciousness: sedated  Airway & Oxygen Therapy: Patient Spontanous Breathing and Patient connected to face mask oxygen  Post-op Assessment: Report given to RN and Post -op Vital signs reviewed and stable  Post vital signs: Reviewed and stable  Last Vitals:  Vitals Value Taken Time  BP    Temp    Pulse 67 08/16/2018  3:03 PM  Resp    SpO2 100 % 08/16/2018  3:03 PM  Vitals shown include unvalidated device data.  Last Pain:  Vitals:   08/16/18 1209  TempSrc:   PainSc: 0-No pain      Patients Stated Pain Goal: 3 (49/44/96 [id number])  Complications: No apparent anesthesia complications

## 2018-08-16 NOTE — Progress Notes (Signed)
PACU nuring note - Patient has orders to go home with urinary in place, catheter teaching done, proper care and cleaning reviewed. Standard drainage bag replaced with leg bag at patient request.

## 2018-08-16 NOTE — Discharge Instructions (Addendum)

## 2018-08-16 NOTE — Anesthesia Procedure Notes (Signed)
Procedure Name: LMA Insertion Date/Time: 08/16/2018 2:14 PM Performed by: Cynda Familia, CRNA Pre-anesthesia Checklist: Patient identified, Emergency Drugs available, Suction available and Patient being monitored Patient Re-evaluated:Patient Re-evaluated prior to induction Oxygen Delivery Method: Circle System Utilized Preoxygenation: Pre-oxygenation with 100% oxygen Induction Type: IV induction Ventilation: Mask ventilation without difficulty LMA: LMA inserted LMA Size: 4.0 Tube type: Oral Number of attempts: 1 Placement Confirmation: positive ETCO2 Tube secured with: Tape Dental Injury: Teeth and Oropharynx as per pre-operative assessment  Comments: Smooth IV induction Fitzgerald-- LMA insertin AM CRNA atraumatic-- teeth and mouth as preop-- bilat BS Ola Spurr

## 2018-08-16 NOTE — Anesthesia Procedure Notes (Addendum)
Date/Time: 08/16/2018 2:54 PM Performed by: Cynda Familia, CRNA Oxygen Delivery Method: Simple face mask Placement Confirmation: positive ETCO2 and breath sounds checked- equal and bilateral Dental Injury: Teeth and Oropharynx as per pre-operative assessment

## 2018-08-16 NOTE — Anesthesia Preprocedure Evaluation (Signed)
Anesthesia Evaluation  Patient identified by MRN, date of birth, ID band Patient awake    Reviewed: Allergy & Precautions, NPO status , Patient's Chart, lab work & pertinent test results  Airway Mallampati: II  TM Distance: >3 FB     Dental  (+) Dental Advisory Given   Pulmonary former smoker,    breath sounds clear to auscultation       Cardiovascular hypertension, Pt. on medications + Peripheral Vascular Disease   Rhythm:Regular Rate:Normal     Neuro/Psych negative neurological ROS     GI/Hepatic negative GI ROS, Neg liver ROS,   Endo/Other  negative endocrine ROS  Renal/GU CRFRenal disease     Musculoskeletal  (+) Arthritis ,   Abdominal   Peds  Hematology negative hematology ROS (+)   Anesthesia Other Findings   Reproductive/Obstetrics                             Lab Results  Component Value Date   WBC 7.5 08/09/2018   HGB 11.3 (L) 08/09/2018   HCT 35.0 (L) 08/09/2018   MCV 98.6 08/09/2018   PLT 169 08/09/2018   Lab Results  Component Value Date   CREATININE 1.38 (H) 08/09/2018   BUN 34 (H) 08/09/2018   NA 140 08/09/2018   K 3.6 08/09/2018   CL 105 08/09/2018   CO2 28 08/09/2018    Anesthesia Physical Anesthesia Plan  ASA: III  Anesthesia Plan: General   Post-op Pain Management:    Induction: Intravenous  PONV Risk Score and Plan: 2  Airway Management Planned: LMA  Additional Equipment:   Intra-op Plan:   Post-operative Plan: Extubation in OR  Informed Consent: I have reviewed the patients History and Physical, chart, labs and discussed the procedure including the risks, benefits and alternatives for the proposed anesthesia with the patient or authorized representative who has indicated his/her understanding and acceptance.   Dental advisory given  Plan Discussed with: CRNA  Anesthesia Plan Comments:         Anesthesia Quick Evaluation

## 2018-08-17 ENCOUNTER — Encounter (HOSPITAL_COMMUNITY): Payer: Self-pay | Admitting: Urology

## 2018-08-23 DIAGNOSIS — N393 Stress incontinence (female) (male): Secondary | ICD-10-CM | POA: Diagnosis not present

## 2018-08-23 DIAGNOSIS — R339 Retention of urine, unspecified: Secondary | ICD-10-CM | POA: Diagnosis not present

## 2018-08-23 DIAGNOSIS — C61 Malignant neoplasm of prostate: Secondary | ICD-10-CM | POA: Diagnosis not present

## 2018-08-30 DIAGNOSIS — R310 Gross hematuria: Secondary | ICD-10-CM | POA: Diagnosis not present

## 2018-09-03 ENCOUNTER — Encounter: Payer: Self-pay | Admitting: *Deleted

## 2018-09-03 ENCOUNTER — Emergency Department (HOSPITAL_COMMUNITY)
Admission: EM | Admit: 2018-09-03 | Discharge: 2018-09-03 | Disposition: A | Discharge status: Home / Self Care | Payer: Medicare Other | Attending: Emergency Medicine | Admitting: Emergency Medicine

## 2018-09-03 ENCOUNTER — Other Ambulatory Visit: Payer: Self-pay

## 2018-09-03 ENCOUNTER — Encounter (HOSPITAL_COMMUNITY): Payer: Self-pay | Admitting: Emergency Medicine

## 2018-09-03 DIAGNOSIS — Z86718 Personal history of other venous thrombosis and embolism: Secondary | ICD-10-CM | POA: Diagnosis not present

## 2018-09-03 DIAGNOSIS — Z79899 Other long term (current) drug therapy: Secondary | ICD-10-CM | POA: Insufficient documentation

## 2018-09-03 DIAGNOSIS — N390 Urinary tract infection, site not specified: Secondary | ICD-10-CM | POA: Diagnosis not present

## 2018-09-03 DIAGNOSIS — Z8546 Personal history of malignant neoplasm of prostate: Secondary | ICD-10-CM | POA: Insufficient documentation

## 2018-09-03 DIAGNOSIS — R319 Hematuria, unspecified: Secondary | ICD-10-CM

## 2018-09-03 DIAGNOSIS — Z87891 Personal history of nicotine dependence: Secondary | ICD-10-CM | POA: Insufficient documentation

## 2018-09-03 DIAGNOSIS — R300 Dysuria: Secondary | ICD-10-CM

## 2018-09-03 DIAGNOSIS — I10 Essential (primary) hypertension: Secondary | ICD-10-CM | POA: Insufficient documentation

## 2018-09-03 DIAGNOSIS — Z7982 Long term (current) use of aspirin: Secondary | ICD-10-CM | POA: Insufficient documentation

## 2018-09-03 LAB — URINALYSIS, ROUTINE W REFLEX MICROSCOPIC
Bilirubin Urine: NEGATIVE
GLUCOSE, UA: NEGATIVE mg/dL
Ketones, ur: NEGATIVE mg/dL
NITRITE: NEGATIVE
PH: 6 (ref 5.0–8.0)
Protein, ur: 100 mg/dL — AB
RBC / HPF: >50 RBC/hpf — ABNORMAL HIGH (ref 0–5)
SPECIFIC GRAVITY, URINE: 1.006 (ref 1.005–1.030)

## 2018-09-03 MED ORDER — FOSFOMYCIN TROMETHAMINE 3 G PO PACK
3.0000 g | PACK | Freq: Once | ORAL | Status: AC
  Administered 2018-09-03: 3 g via ORAL
  Filled 2018-09-03: qty 3

## 2018-09-03 NOTE — ED Provider Notes (Signed)
[facility] DEPT Provider Note   CSN: [id number] Arrival date & time: 09/03/18  1758     History   Chief Complaint Chief Complaint  Patient presents with  . Urinary Retention    HPI [name] is a 84 y.o. male.  The history is provided by the patient and medical records. No language interpreter was used.  Dysuria   This is a new problem. The current episode started 12 to 24 hours ago. The problem occurs every urination. The problem has not changed since onset.The quality of the pain is described as burning. The pain is mild. There has been no fever. Pertinent negatives include no chills, no nausea, no vomiting, no discharge, no frequency, no hematuria, no hesitancy, no urgency and no flank pain. He has tried antibiotics for the symptoms. His past medical history is significant for urological procedure.    Past Medical History:  Diagnosis Date  . Adenocarcinoma of colon ([place])   . Arthritis   . Diverticulitis   . Gout    no recent flare  . Hemorrhoids   . History of blood transfusion   . Hypertension   . Incontinence   . Peripheral vascular disease ([place]) 2011   DVT right leg  . Prostate carcinoma ([place])   . Wears dentures    upper   . Wears glasses     Patient Active Problem List   Diagnosis Date Noted  .  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 Colon cancer ([place]) 12/15/2013  . Acute lower GI bleeding 11/14/2013  . History of pulmonary embolism 11/14/2013  . LOCALIZED SUPERFICIAL SWELLING MASS OR LUMP 09/30/2010  . DIVERTICULAR DISEASE 06/21/2009  . ACNE ROSACEA 05/12/2009  . ARTHRALGIA 05/12/2009  . Gout, unspecified 05/11/2008  . HYPERTENSION 05/11/2008  . KIDNEY DISEASE, CHRONIC NOS 05/11/2008  . COUGH 04/17/2007  . ESOPHAGEAL STRICTURE 01/23/2007  . ADENOCARCINOMA, PROSTATE, HX OF 01/23/2007    Past Surgical History:  Procedure Laterality Date  . COLON SURGERY    . COLONOSCOPY N/A  11/17/2013   Procedure: COLONOSCOPY;  Surgeon: Jeryl Columbia, MD;  Location: [facility];  Service: Endoscopy;  Laterality: N/A;  . CYSTOSCOPY W/ RETROGRADES Bilateral 08/16/2018   Procedure: CYSTOSCOPY WITH BILATERAL  RETROGRADE PYELOGRAM;  Surgeon: Lucas Mallow, MD;  Location: [facility];  Service: Urology;  Laterality: Bilateral;  . diverticulitis    . EYE SURGERY Bilateral    cataract extraction with IOL  . HERNIA REPAIR  1999  . HOT HEMOSTASIS N/A 11/17/2013   Procedure: HOT HEMOSTASIS (ARGON PLASMA COAGULATION/BICAP);  Surgeon: Jeryl Columbia, MD;  Location: [facility];  Service: Endoscopy;  Laterality: N/A;  . ILEOSTOMY    . LAPAROSCOPIC LYSIS OF ADHESIONS N/A 01/06/2014   Procedure: LAPAROSCOPIC LYSIS OF ADHESIONS;  Surgeon: Odis Hollingshead, MD;  Location: [facility];  Service: General;  Laterality: N/A;  . LAPAROSCOPIC PARTIAL COLECTOMY N/A 01/06/2014   Procedure: LAPAROSCOPIC ASSISTED PARTIAL COLECTOMY;  Surgeon: Odis Hollingshead, MD;  Location: [facility];  Service: General;  Laterality: N/A;  . [place]  . TONSILLECTOMY    . TRANSURETHRAL RESECTION OF BLADDER TUMOR N/A 08/16/2018   Procedure: TRANSURETHRAL RESECTION OF BLADDER TUMOR (TURBT);  Surgeon: Lucas Mallow, MD;  [facility];  Service: Urology;  Laterality: N/A;        Home Medications    Prior to Admission medications   Medication Sig Start Date End Date Taking? Authorizing  Provider  allopurinol (ZYLOPRIM) 300 MG tablet Take 300 mg by mouth daily.      [provider]  aspirin EC 81 MG tablet Take 81 mg by mouth daily.    [provider]  carvedilol (COREG) 6.25 MG tablet Take 6.25 mg by mouth 2 (two) times daily with a meal.      [provider]  hydrochlorothiazide (HYDRODIURIL) 50 MG tablet Take 50 mg by mouth daily.     [provider]  HYDROcodone-acetaminophen (NORCO/VICODIN) 5-325 MG tablet Take 1 tablet by mouth every 4 (four) hours as needed for  moderate pain. 08/16/18 08/16/19  Lucas Mallow, MD  loperamide (IMODIUM) 2 MG capsule Take 2 mg by mouth as needed for diarrhea or loose stools.    [provider]  Multiple Vitamin (MULTIVITAMIN WITH MINERALS) TABS tablet Take 1 tablet by mouth daily.    [provider]  potassium chloride SA (K-DUR,KLOR-CON) 20 MEQ tablet Take 20 mEq by mouth daily. 07/13/18   [provider]    Family History No family history on file.  Social History Social History   Tobacco Use  . Smoking status: Former Smoker    Types: Cigarettes, Pipe    Last attempt to quit: 01/02/1980    Years since quitting: 38.6  . Smokeless tobacco: Never Used  Substance Use Topics  . Alcohol use: No  . Drug use: No     Allergies   Sulfa antibiotics and Sulfonamide derivatives   Review of Systems Review of Systems  Constitutional: Negative for chills, diaphoresis, fatigue and fever.  HENT: Negative for congestion.   Eyes: Negative for visual disturbance.  Respiratory: Negative for cough, chest tightness, shortness of breath and wheezing.   Cardiovascular: Negative for chest pain, palpitations and leg swelling.  Gastrointestinal: Negative for abdominal pain, nausea and vomiting.  Genitourinary: Positive for dysuria. Negative for flank pain, frequency, hematuria, hesitancy and urgency.  Musculoskeletal: Negative for back pain, neck pain and neck stiffness.  Skin: Negative for rash and wound.  Neurological: Negative for light-headedness and headaches.  Psychiatric/Behavioral: Negative for agitation and confusion.  All other systems reviewed and are negative.    Physical Exam Updated Vital Signs BP (!) 147/109 (BP Location: Left Arm)   Pulse (!) 112   Temp (!) 97.5 F (36.4 C) (Oral)   Resp 16   SpO2 100%   Physical Exam  Constitutional: He is oriented to person, place, and time. He appears well-developed and well-nourished. No distress.  HENT:  Head: Normocephalic and  atraumatic.  Mouth/Throat: Oropharynx is clear and moist. No oropharyngeal exudate.  Eyes: Pupils are equal, round, and reactive to light. Conjunctivae and EOM are normal.  Neck: Normal range of motion. Neck supple.  Cardiovascular: Normal rate and regular rhythm.  No murmur heard. Pulmonary/Chest: Effort normal and breath sounds normal. No respiratory distress. He has no wheezes. He has no rales. He exhibits no tenderness.  Abdominal: Soft. There is no tenderness. There is no guarding.  Genitourinary: Penis normal.  Musculoskeletal: He exhibits no edema or tenderness.  Neurological: He is alert and oriented to person, place, and time. No sensory deficit. He exhibits normal muscle tone.  Skin: Skin is warm and dry. He is not diaphoretic. No erythema. No pallor.  Psychiatric: He has a normal mood and affect.  Nursing note and vitals reviewed.    ED Treatments / Results  Labs (all labs ordered are listed, but only abnormal results are displayed) Labs Reviewed  URINALYSIS, ROUTINE  W REFLEX MICROSCOPIC - Abnormal; Notable for the following components:      Result Value   APPearance HAZY (*)    Hgb urine dipstick LARGE (*)    Protein, ur 100 (*)    Leukocytes, UA MODERATE (*)    RBC / HPF >50 (*)    WBC, UA >50 (*)    Bacteria, UA RARE (*)    All other components within normal limits  URINE CULTURE    EKG None  Radiology No results found.  Procedures Procedures (including critical care time)  Medications Ordered in ED Medications  fosfomycin (MONUROL) packet 3 g (has no administration in time range)     Initial Impression / Assessment and Plan / ED Course  I have reviewed the triage vital signs and the nursing notes.  Pertinent labs & imaging results that were available during my care of the patient were reviewed by me and considered in my medical decision making (see chart for details).     [name] is a 84 y.o. male with a past medical history significant  for colon cancer status post partial colectomy, prostate cancer status post recent TURP 2 weeks ago, diverticular disease, and hypertension who presents with urinary retention and dysuria.  Patient reports that he is currently on cefuroxime 500 mg twice daily as ordered by his urologist for postprocedural antibiotic prophylaxis.  He says that he typically has poor control of his urine but he reports today in the afternoon he has not been able to urinate.  He reported some bladder pressure and then has what had dysuria.  He reports that he has had no fevers, chills, flank pain, or abdominal pain.  He denies other trauma.  He denies any penile bleeding.  On exam, groin is unremarkable with no tenderness of the penis.  No lesions seen.  Testicles nontender.  No palpable hernias.  Abdomen nontender.  Exam reassuring otherwise.  Bladder scan was performed after patient was able to urinate.  Patient had resolution of his retention shortly after triage bladder scan.  Postvoid residual was only 60 cc.  Patient was feeling much better.  He reports that the dysuria continued.  Due to patient's current antibiotics, pharmacy was called who recommended a dose of fosfomycin today and have him continue his antibiotics.  Patient was given a dose of fosfomycin and will follow-up with his urologist.  Patient has no retention.  Patient had no other symptoms and feels comfortable following up with his urologist.  Patient with questions or concerns and was discharged in good condition.   Final Clinical Impressions(s) / ED Diagnoses   Final diagnoses:  Dysuria  Lower urinary tract infectious disease  Hematuria, unspecified type    ED Discharge Orders    None      Clinical Impression: 1. Dysuria   2. Lower urinary tract infectious disease   3. Hematuria, unspecified type     Disposition: Discharge  Condition: Good  I have discussed the results, Dx and Tx plan with the pt(& family if present).  He/she/they expressed understanding and agree(s) with the plan. Discharge instructions discussed at great length. Strict return precautions discussed and pt &/or family have verbalized understanding of the instructions. No further questions at time of discharge.    New Prescriptions   No medications on file    Follow Up: Your urologist     [facility] DEPT [place] [id number] [place] [phone]  , Gwenyth Allegra, MD 09/03/18 2306

## 2018-09-03 NOTE — Congregational Nurse Program (Signed)
[id number] Davis,BSN,RN3,CCM [id number] came into the [facility] office stating that he cannot pass his urine. Recently had urologic surgery and had cath removed.  Instructed to call md on call for further instructions.  If he cannot reach md or get worse to go to the er for assistance.

## 2018-09-03 NOTE — ED Notes (Signed)
Bladder Scan: 62mL

## 2018-09-03 NOTE — ED Triage Notes (Signed)
Patient reports he has not had urinary output since "after lunch" today. Hx urinary incontinence. Reports tumor was removed from bladder in October.

## 2018-09-03 NOTE — Discharge Instructions (Signed)
Your urinalysis today showed concern for infection.  As you develop new symptoms of intermittent retention and the burning when you urinated while still on those antibiotics, pharmacy recommended a different antibiotic today which we provided.  Please continue the other antibiotics and follow-up with your urologist.  We did not see any evidence of retention persisting.  We feel you are safe for discharge home.  Please return to the nearest emergency department any symptoms change or worsen.

## 2018-09-05 DIAGNOSIS — N3000 Acute cystitis without hematuria: Secondary | ICD-10-CM | POA: Diagnosis not present

## 2018-09-05 LAB — URINE CULTURE: Culture: NO GROWTH

## 2018-09-06 ENCOUNTER — Encounter (HOSPITAL_COMMUNITY): Payer: Self-pay

## 2018-09-06 ENCOUNTER — Other Ambulatory Visit: Payer: Self-pay

## 2018-09-06 ENCOUNTER — Emergency Department (HOSPITAL_COMMUNITY)
Admission: EM | Admit: 2018-09-06 | Discharge: 2018-09-07 | Disposition: A | Discharge status: Home / Self Care | Payer: Medicare Other | Attending: Emergency Medicine | Admitting: Emergency Medicine

## 2018-09-06 DIAGNOSIS — Z85038 Personal history of other malignant neoplasm of large intestine: Secondary | ICD-10-CM | POA: Diagnosis not present

## 2018-09-06 DIAGNOSIS — Z79899 Other long term (current) drug therapy: Secondary | ICD-10-CM | POA: Diagnosis not present

## 2018-09-06 DIAGNOSIS — N390 Urinary tract infection, site not specified: Secondary | ICD-10-CM | POA: Diagnosis not present

## 2018-09-06 DIAGNOSIS — R300 Dysuria: Secondary | ICD-10-CM | POA: Diagnosis present

## 2018-09-06 DIAGNOSIS — Z7982 Long term (current) use of aspirin: Secondary | ICD-10-CM | POA: Insufficient documentation

## 2018-09-06 DIAGNOSIS — Z8546 Personal history of malignant neoplasm of prostate: Secondary | ICD-10-CM | POA: Diagnosis not present

## 2018-09-06 DIAGNOSIS — Z87891 Personal history of nicotine dependence: Secondary | ICD-10-CM | POA: Insufficient documentation

## 2018-09-06 DIAGNOSIS — I10 Essential (primary) hypertension: Secondary | ICD-10-CM | POA: Insufficient documentation

## 2018-09-06 LAB — TYPE AND SCREEN
ABO/RH(D): O POS
Antibody Screen: NEGATIVE

## 2018-09-06 LAB — CBC
HEMATOCRIT: 32.9 % — AB (ref 39.0–52.0)
Hemoglobin: 10.8 g/dL — ABNORMAL LOW (ref 13.0–17.0)
MCH: 31.4 pg (ref 26.0–34.0)
MCHC: 32.8 g/dL (ref 30.0–36.0)
MCV: 95.6 fL (ref 80.0–100.0)
Platelets: 223 10*3/uL (ref 150–400)
RBC: 3.44 MIL/uL — ABNORMAL LOW (ref 4.22–5.81)
RDW: 14.3 % (ref 11.5–15.5)
WBC: 7.9 10*3/uL (ref 4.0–10.5)
nRBC: 0 % (ref 0.0–0.2)

## 2018-09-06 LAB — COMPREHENSIVE METABOLIC PANEL
ALBUMIN: 3.4 g/dL — AB (ref 3.5–5.0)
ALT: 18 U/L (ref 0–44)
AST: 23 U/L (ref 15–41)
Alkaline Phosphatase: 77 U/L (ref 38–126)
Anion gap: 6 (ref 5–15)
BUN: 28 mg/dL — AB (ref 8–23)
CHLORIDE: 105 mmol/L (ref 98–111)
CO2: 27 mmol/L (ref 22–32)
Calcium: 9.4 mg/dL (ref 8.9–10.3)
Creatinine, Ser: 1.28 mg/dL — ABNORMAL HIGH (ref 0.61–1.24)
GFR calc Af Amer: 58 mL/min — ABNORMAL LOW (ref >60)
GFR calc non Af Amer: 50 mL/min — ABNORMAL LOW (ref >60)
Glucose, Bld: 111 mg/dL — ABNORMAL HIGH (ref 70–99)
Potassium: 3.4 mmol/L — ABNORMAL LOW (ref 3.5–5.1)
SODIUM: 138 mmol/L (ref 135–145)
Total Bilirubin: 0.2 mg/dL — ABNORMAL LOW (ref 0.3–1.2)
Total Protein: 6.4 g/dL — ABNORMAL LOW (ref 6.5–8.1)

## 2018-09-06 LAB — URINALYSIS, ROUTINE W REFLEX MICROSCOPIC
BACTERIA UA: NONE SEEN
BILIRUBIN URINE: NEGATIVE
Glucose, UA: NEGATIVE mg/dL
KETONES UR: NEGATIVE mg/dL
NITRITE: NEGATIVE
PH: 6 (ref 5.0–8.0)
Protein, ur: NEGATIVE mg/dL
Specific Gravity, Urine: 1.004 — ABNORMAL LOW (ref 1.005–1.030)
WBC, UA: >50 WBC/hpf — ABNORMAL HIGH (ref 0–5)

## 2018-09-06 LAB — POC OCCULT BLOOD, ED: Fecal Occult Bld: POSITIVE — AB

## 2018-09-06 MED ORDER — POTASSIUM CHLORIDE CRYS ER 20 MEQ PO TBCR
40.0000 meq | EXTENDED_RELEASE_TABLET | Freq: Once | ORAL | Status: AC
  Administered 2018-09-06: 40 meq via ORAL
  Filled 2018-09-06: qty 2

## 2018-09-06 MED ORDER — CEPHALEXIN 500 MG PO CAPS: 500.0000 mg | ORAL_CAPSULE | Freq: Three times a day (TID) | ORAL | 0 refills | Status: DC

## 2018-09-06 MED ORDER — SODIUM CHLORIDE 0.9 % IV SOLN
1.0000 g | Freq: Once | INTRAVENOUS | Status: AC
  Administered 2018-09-06: 1 g via INTRAVENOUS
  Filled 2018-09-06: qty 10

## 2018-09-06 MED ORDER — ACETAMINOPHEN 325 MG PO TABS
650.0000 mg | ORAL_TABLET | Freq: Once | ORAL | Status: AC
  Administered 2018-09-06: 650 mg via ORAL
  Filled 2018-09-06: qty 2

## 2018-09-06 NOTE — ED Triage Notes (Signed)
Patient c/o urinary frequency and has was treated recently for a UTI. patient states he began having diarrhea with bright red blood in his stool today.

## 2018-09-06 NOTE — Discharge Instructions (Addendum)
It was our pleasure to provide your ER care today - we hope that you feel better.  For urine infection, take antibiotic (keflex) as prescribed. Drink plenty of fluids.  Follow up with your urologist in the coming week.   Return to ER if worse, new symptoms, high fevers, new or severe pain, persistent vomiting, increased and/or persistent rectal bleeding, unable to urinate, other concern.  From today's labs, your potassium level is slightly low - eat plenty of fruits and vegetables, and follow up with primary care doctor.

## 2018-09-06 NOTE — ED Provider Notes (Signed)
[facility] DEPT Provider Note   CSN: [id number] Arrival date & time: 09/06/18  1733     History   Chief Complaint Chief Complaint  Patient presents with  . Rectal Bleeding  . Urinary Frequency  . Dysuria    HPI ARHAM SYMMONDS is a 84 y.o. male.  Patient with hx recent surgical procedure 10/25 for bladder ca, presents indicating since then intermittent dysuria. States today has been urinating okay, denies abd pain or suprapubic fullness/distension. Feels he is urinating normal amount. In past day, notes diarrhea stools with several loose to watery stools. After diarrhea, now notes streaks of blood in stool. No melena. No faintness or dizziness. No fever or chills. No abd pain.   The history is provided by the patient.  Rectal Bleeding  Associated symptoms: no abdominal pain, no fever and no vomiting   Urinary Frequency  Pertinent negatives include no chest pain, no abdominal pain, no headaches and no shortness of breath.  Dysuria   Associated symptoms include frequency. Pertinent negatives include no vomiting and no flank pain.    Past Medical History:  Diagnosis Date  . Adenocarcinoma of colon ([place])   . Arthritis   . Diverticulitis   . Gout    no recent flare  . Hemorrhoids   . History of blood transfusion   . Hypertension   . Incontinence   . Peripheral vascular disease ([place]) 2011   DVT right leg  . Prostate carcinoma ([place])   . Wears dentures    upper   . Wears glasses     Patient Active Problem List   Diagnosis Date Noted  .  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 Colon cancer ([place]) 12/15/2013  . Acute lower GI bleeding 11/14/2013  . History of pulmonary embolism 11/14/2013  . LOCALIZED SUPERFICIAL SWELLING MASS OR LUMP 09/30/2010  . DIVERTICULAR DISEASE 06/21/2009  . ACNE ROSACEA 05/12/2009  . ARTHRALGIA 05/12/2009  . Gout, unspecified 05/11/2008  . HYPERTENSION 05/11/2008  . KIDNEY  DISEASE, CHRONIC NOS 05/11/2008  . COUGH 04/17/2007  . ESOPHAGEAL STRICTURE 01/23/2007  . ADENOCARCINOMA, PROSTATE, HX OF 01/23/2007    Past Surgical History:  Procedure Laterality Date  . COLON SURGERY    . COLONOSCOPY N/A 11/17/2013   Procedure: COLONOSCOPY;  Surgeon: Jeryl Columbia, MD;  [facility];  Service: Endoscopy;  Laterality: N/A;  . CYSTOSCOPY W/ RETROGRADES Bilateral 08/16/2018   Procedure: CYSTOSCOPY WITH BILATERAL  RETROGRADE PYELOGRAM;  Surgeon: Lucas Mallow, MD;  Location[facility];  Service: Urology;  Laterality: Bilateral;  . diverticulitis    . EYE SURGERY Bilateral    cataract extraction with IOL  . HERNIA REPAIR  1999  . HOT HEMOSTASIS N/A 11/17/2013   Procedure: HOT HEMOSTASIS (ARGON PLASMA COAGULATION/BICAP);  Surgeon: Jeryl Columbia, MD;  Location: [facility];  Service: Endoscopy;  Laterality: N/A;  . ILEOSTOMY    . LAPAROSCOPIC LYSIS OF ADHESIONS N/A 01/06/2014   Procedure: LAPAROSCOPIC LYSIS OF ADHESIONS;  Surgeon: Odis Hollingshead, MD;  Location: [facility];  Service: General;  Laterality: N/A;  . LAPAROSCOPIC PARTIAL COLECTOMY N/A 01/06/2014   Procedure: LAPAROSCOPIC ASSISTED PARTIAL COLECTOMY;  Surgeon: Odis Hollingshead, MD;  Location: [facility];  Service: General;  Laterality: N/A;  . [place]  . TONSILLECTOMY    . TRANSURETHRAL RESECTION OF BLADDER TUMOR N/A 08/16/2018   Procedure: TRANSURETHRAL RESECTION OF BLADDER TUMOR (TURBT);  Surgeon: Lucas Mallow, MD;  [facility];  Service: Urology;  Laterality: N/A;        Home Medications    Prior to Admission medications   Medication Sig Start Date End Date Taking? Authorizing Provider  allopurinol (ZYLOPRIM) 300 MG tablet Take 300 mg by mouth every evening.    Yes [provider]  aspirin EC 81 MG tablet Take 81 mg by mouth daily.   Yes [provider]  carvedilol (COREG) 6.25 MG tablet Take 6.25 mg by mouth 2 (two) times daily with a meal.     Yes  [provider]  clotrimazole-betamethasone (LOTRISONE) cream Apply 1 application topically 2 (two) times daily.  08/13/18  Yes [provider]  hydrochlorothiazide (HYDRODIURIL) 50 MG tablet Take 50 mg by mouth daily.    Yes [provider]  Multiple Vitamin (MULTIVITAMIN WITH MINERALS) TABS tablet Take 1 tablet by mouth daily.   Yes [provider]  potassium chloride SA (K-DUR,KLOR-CON) 20 MEQ tablet Take 20 mEq by mouth every evening.  07/13/18  Yes [provider]  cefUROXime (CEFTIN) 500 MG tablet Take 500 mg by mouth 2 (two) times daily. 08/30/18   [provider]  doxycycline (VIBRA-TABS) 100 MG tablet Take 100 mg by mouth 2 (two) times daily.  09/05/18   [provider]  HYDROcodone-acetaminophen (NORCO/VICODIN) 5-325 MG tablet Take 1 tablet by mouth every 4 (four) hours as needed for moderate pain. 08/16/18 08/16/19  Lucas Mallow, MD  loperamide (IMODIUM) 2 MG capsule Take 2 mg by mouth as needed for diarrhea or loose stools.    [provider]    Family History History reviewed. No pertinent family history.  Social History Social History   Tobacco Use  . Smoking status: Former Smoker    Types: Cigarettes, Pipe    Last attempt to quit: 01/02/1980    Years since quitting: 38.7  . Smokeless tobacco: Never Used  Substance Use Topics  . Alcohol use: No  . Drug use: No     Allergies   Sulfa antibiotics and Sulfonamide derivatives   Review of Systems Review of Systems  Constitutional: Negative for fever.  HENT: Negative for sore throat.   Eyes: Negative for redness.  Respiratory: Negative for shortness of breath.   Cardiovascular: Negative for chest pain.  Gastrointestinal: Positive for hematochezia. Negative for abdominal pain and vomiting.  Genitourinary: Positive for dysuria and frequency. Negative for flank pain.  Musculoskeletal: Negative for back pain and neck pain.  Skin: Negative for  rash.  Neurological: Negative for headaches.  Hematological: Does not bruise/bleed easily.  Psychiatric/Behavioral: Negative for confusion.     Physical Exam Updated Vital Signs BP (!) 157/103   Pulse 83   Temp 97.8 F (36.6 C) (Oral)   Resp 18   Ht 1.778 m (5\' 10" )   Wt 77.6 kg   SpO2 100%   BMI 24.55 kg/m   Physical Exam  Constitutional: He appears well-developed and well-nourished.  HENT:  Mouth/Throat: Oropharynx is clear and moist.  Eyes: Conjunctivae are normal.  Neck: Neck supple. No tracheal deviation present.  Cardiovascular: Normal rate, regular rhythm, normal heart sounds and intact distal pulses. Exam reveals no gallop and no friction rub.  No murmur heard. Pulmonary/Chest: Effort normal and breath sounds normal. No accessory muscle usage. No respiratory distress.  Abdominal: Soft. Bowel sounds are normal. He exhibits no distension. There is no tenderness.  Genitourinary:  Genitourinary Comments: Rectal exam: no mass felt. Scant lose to watery stool with a  few tiny streaks bright red blood.   Musculoskeletal: He exhibits no edema.  Neurological: He is alert.  Skin: Skin is warm and dry.  Psychiatric: He has a normal mood and affect.  Nursing note and vitals reviewed.    ED Treatments / Results  Labs (all labs ordered are listed, but only abnormal results are displayed) Results for orders placed or performed during the hospital encounter of 09/06/18  Comprehensive metabolic panel  Result Value Ref Range   Sodium 138 135 - 145 mmol/L   Potassium 3.4 (L) 3.5 - 5.1 mmol/L   Chloride 105 98 - 111 mmol/L   CO2 27 22 - 32 mmol/L   Glucose, Bld 111 (H) 70 - 99 mg/dL   BUN 28 (H) 8 - 23 mg/dL   Creatinine, Ser 1.28 (H) 0.61 - 1.24 mg/dL   Calcium 9.4 8.9 - 10.3 mg/dL   Total Protein 6.4 (L) 6.5 - 8.1 g/dL   Albumin 3.4 (L) 3.5 - 5.0 g/dL   AST 23 15 - 41 U/L   ALT 18 0 - 44 U/L   Alkaline Phosphatase 77 38 - 126 U/L   Total Bilirubin 0.2 (L) 0.3 - 1.2  mg/dL   GFR calc non Af Amer 50 (L) >60 mL/min   GFR calc Af Amer 58 (L) >60 mL/min   Anion gap 6 5 - 15  CBC  Result Value Ref Range   WBC 7.9 4.0 - 10.5 K/uL   RBC 3.44 (L) 4.22 - 5.81 MIL/uL   Hemoglobin 10.8 (L) 13.0 - 17.0 g/dL   HCT 32.9 (L) 39.0 - 52.0 %   MCV 95.6 80.0 - 100.0 fL   MCH 31.4 26.0 - 34.0 pg   MCHC 32.8 30.0 - 36.0 g/dL   RDW 14.3 11.5 - 15.5 %   Platelets 223 150 - 400 K/uL   nRBC 0.0 0.0 - 0.2 %  Urinalysis, Routine w reflex microscopic  Result Value Ref Range   Color, Urine COLORLESS (A) YELLOW   APPearance CLEAR CLEAR   Specific Gravity, Urine 1.004 (L) 1.005 - 1.030   pH 6.0 5.0 - 8.0   Glucose, UA NEGATIVE NEGATIVE mg/dL   Hgb urine dipstick MODERATE (A) NEGATIVE   Bilirubin Urine NEGATIVE NEGATIVE   Ketones, ur NEGATIVE NEGATIVE mg/dL   Protein, ur NEGATIVE NEGATIVE mg/dL   Nitrite NEGATIVE NEGATIVE   Leukocytes, UA MODERATE (A) NEGATIVE   RBC / HPF 0-5 0 - 5 RBC/hpf   WBC, UA >50 (H) 0 - 5 WBC/hpf   Bacteria, UA NONE SEEN NONE SEEN   Mucus PRESENT   POC occult blood, ED  Result Value Ref Range   Fecal Occult Bld POSITIVE (A) NEGATIVE  Type and screen [place]  Result Value Ref Range   ABO/RH(D) O POS    Antibody Screen NEG    Sample Expiration      09/09/2018 Performed at [facility], [place] [address], [place], [place] [address]     EKG None  Radiology No results found.  Procedures Procedures (including critical care time)  Medications Ordered in ED Medications - No data to display   Initial Impression / Assessment and Plan / ED Course  I have reviewed the triage vital signs and the nursing notes.  Pertinent labs & imaging results that were available during my care of the patient were reviewed by me and considered in my medical decision making (see chart for details).  Labs sent. Bladder scan w only  300 cc, and patient subsequently able to urinate.   Reviewed nursing notes and prior  charts for additional history.   Labs reviewed - chem c/w baseline w exception k sl low, 3.4, cbc c/w baseline. ua positive for uti - urine culture sent, iv rocephin given.   kcl po. Pt tolerating po fluids. No episodes of additional diarrhea in ED. abd soft nt.   Vital signs normal. Pt comfortable appearing and in no distress.  Pt currently appears stable for d/c.   Return precautions provided.     Final Clinical Impressions(s) / ED Diagnoses   Final diagnoses:  None    ED Discharge Orders    None       Lajean Saver, MD 09/06/18 2135

## 2018-09-07 NOTE — ED Notes (Signed)
Patient waiting in back with staff until cab arrives

## 2018-09-07 NOTE — ED Notes (Signed)
Patient escorted to cab.

## 2018-09-09 ENCOUNTER — Telehealth: Payer: Self-pay | Admitting: *Deleted

## 2018-09-09 DIAGNOSIS — N3000 Acute cystitis without hematuria: Secondary | ICD-10-CM | POA: Diagnosis not present

## 2018-09-09 NOTE — Telephone Encounter (Signed)
[phone]/ Spoke with [name] the wife is voiding today staying on the the medications that were prescribed at the last er visit and has seen the md since then.Doing better today and has not had anyother retention or bleeding    By Frann Rider, RN

## 2018-09-10 ENCOUNTER — Other Ambulatory Visit: Payer: Self-pay

## 2018-09-10 ENCOUNTER — Emergency Department (HOSPITAL_COMMUNITY)
Admission: EM | Admit: 2018-09-10 | Discharge: 2018-09-10 | Disposition: A | Discharge status: Home / Self Care | Payer: Medicare Other | Attending: Emergency Medicine | Admitting: Emergency Medicine

## 2018-09-10 ENCOUNTER — Encounter (HOSPITAL_COMMUNITY): Payer: Self-pay | Admitting: Emergency Medicine

## 2018-09-10 DIAGNOSIS — I10 Essential (primary) hypertension: Secondary | ICD-10-CM | POA: Insufficient documentation

## 2018-09-10 DIAGNOSIS — Z79899 Other long term (current) drug therapy: Secondary | ICD-10-CM | POA: Insufficient documentation

## 2018-09-10 DIAGNOSIS — Z7982 Long term (current) use of aspirin: Secondary | ICD-10-CM | POA: Diagnosis not present

## 2018-09-10 DIAGNOSIS — Z87891 Personal history of nicotine dependence: Secondary | ICD-10-CM | POA: Diagnosis not present

## 2018-09-10 DIAGNOSIS — R339 Retention of urine, unspecified: Secondary | ICD-10-CM

## 2018-09-10 LAB — URINALYSIS, ROUTINE W REFLEX MICROSCOPIC
BILIRUBIN URINE: NEGATIVE
Glucose, UA: NEGATIVE mg/dL
Ketones, ur: NEGATIVE mg/dL
NITRITE: NEGATIVE
Protein, ur: NEGATIVE mg/dL
SPECIFIC GRAVITY, URINE: 1.006 (ref 1.005–1.030)
pH: 6 (ref 5.0–8.0)

## 2018-09-10 LAB — I-STAT CHEM 8, ED
BUN: 26 mg/dL — AB (ref 8–23)
CALCIUM ION: 1.3 mmol/L (ref 1.15–1.40)
CHLORIDE: 102 mmol/L (ref 98–111)
CREATININE: 1.5 mg/dL — AB (ref 0.61–1.24)
Glucose, Bld: 109 mg/dL — ABNORMAL HIGH (ref 70–99)
HCT: 32 % — ABNORMAL LOW (ref 39.0–52.0)
Hemoglobin: 10.9 g/dL — ABNORMAL LOW (ref 13.0–17.0)
Potassium: 4 mmol/L (ref 3.5–5.1)
SODIUM: 136 mmol/L (ref 135–145)
TCO2: 31 mmol/L (ref 22–32)

## 2018-09-10 MED ORDER — LIDOCAINE HCL URETHRAL/MUCOSAL 2 % EX GEL
1.0000 "application " | Freq: Once | CUTANEOUS | Status: AC | PRN
  Administered 2018-09-10: 1 via URETHRAL
  Filled 2018-09-10: qty 30

## 2018-09-10 NOTE — ED Provider Notes (Signed)
[facility] DEPT Provider Note   CSN: [id number] Arrival date & time: 09/10/18  0055     History   Chief Complaint Chief Complaint  Patient presents with  . Urinary Retention    HPI [name] is a 84 y.o. male.  The history is provided by the patient and medical records.     84 y.o. M with hx of arthritis, gout, HTN, baseline incontinence, prostate carcinoma s/p resection in 1992, recent resection of bladder neck tumor on 08/16/18 with Dr. Gloriann Loan, presenting to the ED for urinary retention.  Patient reports since October he has been having urinary issues on and off, specifically issues with retention, hematuria, and dysuria.  States he was most recently seen here on 09/06/18 and diagnosed with UTI, started on keflex which he is still currently taking.  No issues with medication itself.  States since this morning, he has changed his incontinence pad about 3 times and has not had any urinary output at all which is abnormal for him.  States he has been trying to force some urine out but has not had much luck with that either.  He reports a burning sensation in his urethra.  No fever, chills, nausea, vomiting, abdominal pain, or flank pain.    Past Medical History:  Diagnosis Date  . Adenocarcinoma of colon ([place])   . Arthritis   . Diverticulitis   . Gout    no recent flare  . Hemorrhoids   . History of blood transfusion   . Hypertension   . Incontinence   . Peripheral vascular disease ([place]) 2011   DVT right leg  . Prostate carcinoma ([place])   . Wears dentures    upper   . Wears glasses     Patient Active Problem List   Diagnosis Date Noted  .  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 Colon cancer ([place]) 12/15/2013  . Acute lower GI bleeding 11/14/2013  . History of pulmonary embolism 11/14/2013  . LOCALIZED SUPERFICIAL SWELLING MASS OR LUMP 09/30/2010  . DIVERTICULAR DISEASE 06/21/2009  . ACNE ROSACEA  05/12/2009  . ARTHRALGIA 05/12/2009  . Gout, unspecified 05/11/2008  . HYPERTENSION 05/11/2008  . KIDNEY DISEASE, CHRONIC NOS 05/11/2008  . COUGH 04/17/2007  . ESOPHAGEAL STRICTURE 01/23/2007  . ADENOCARCINOMA, PROSTATE, HX OF 01/23/2007    Past Surgical History:  Procedure Laterality Date  . COLON SURGERY    . COLONOSCOPY N/A 11/17/2013   Procedure: COLONOSCOPY;  Surgeon: Jeryl Columbia, MD;  Location: [facility];  Service: Endoscopy;  Laterality: N/A;  . CYSTOSCOPY W/ RETROGRADES Bilateral 08/16/2018   Procedure: CYSTOSCOPY WITH BILATERAL  RETROGRADE PYELOGRAM;  Surgeon: Lucas Mallow, MD;  Location: [facility];  Service: Urology;  Laterality: Bilateral;  . diverticulitis    . EYE SURGERY Bilateral    cataract extraction with IOL  . HERNIA REPAIR  1999  . HOT HEMOSTASIS N/A 11/17/2013   Procedure: HOT HEMOSTASIS (ARGON PLASMA COAGULATION/BICAP);  Surgeon: Jeryl Columbia, MD;  Location: [facility];  Service: Endoscopy;  Laterality: N/A;  . ILEOSTOMY    . LAPAROSCOPIC LYSIS OF ADHESIONS N/A 01/06/2014   Procedure: LAPAROSCOPIC LYSIS OF ADHESIONS;  Surgeon: Odis Hollingshead, MD;  Location: [facility];  Service: General;  Laterality: N/A;  . LAPAROSCOPIC PARTIAL COLECTOMY N/A 01/06/2014   Procedure: LAPAROSCOPIC ASSISTED PARTIAL COLECTOMY;  Surgeon: Odis Hollingshead, MD;  Location: [facility];  Service: General;  Laterality: N/A;  . [place]  .  TONSILLECTOMY    . TRANSURETHRAL RESECTION OF BLADDER TUMOR N/A 08/16/2018   Procedure: TRANSURETHRAL RESECTION OF BLADDER TUMOR (TURBT);  Surgeon: Lucas Mallow, MD;  Location: [facility];  Service: Urology;  Laterality: N/A;        Home Medications    Prior to Admission medications   Medication Sig Start Date End Date Taking? Authorizing Provider  allopurinol (ZYLOPRIM) 300 MG tablet Take 300 mg by mouth every evening.     [provider]  aspirin EC 81 MG tablet Take 81 mg by mouth daily.    [provider]    carvedilol (COREG) 6.25 MG tablet Take 6.25 mg by mouth 2 (two) times daily with a meal.      [provider]  cefUROXime (CEFTIN) 500 MG tablet Take 500 mg by mouth 2 (two) times daily. 08/30/18   [provider]  cephALEXin (KEFLEX) 500 MG capsule Take 1 capsule (500 mg total) by mouth 3 (three) times daily. 09/06/18   Lajean Saver, MD  clotrimazole-betamethasone (LOTRISONE) cream Apply 1 application topically 2 (two) times daily.  08/13/18   [provider]  doxycycline (VIBRA-TABS) 100 MG tablet Take 100 mg by mouth 2 (two) times daily.  09/05/18   [provider]  hydrochlorothiazide (HYDRODIURIL) 50 MG tablet Take 50 mg by mouth daily.     [provider]  HYDROcodone-acetaminophen (NORCO/VICODIN) 5-325 MG tablet Take 1 tablet by mouth every 4 (four) hours as needed for moderate pain. 08/16/18 08/16/19  Lucas Mallow, MD  loperamide (IMODIUM) 2 MG capsule Take 2 mg by mouth as needed for diarrhea or loose stools.    [provider]  Multiple Vitamin (MULTIVITAMIN WITH MINERALS) TABS tablet Take 1 tablet by mouth daily.    [provider]  potassium chloride SA (K-DUR,KLOR-CON) 20 MEQ tablet Take 20 mEq by mouth every evening.  07/13/18   [provider]    Family History History reviewed. No pertinent family history.  Social History Social History   Tobacco Use  . Smoking status: Former Smoker    Types: Cigarettes, Pipe    Last attempt to quit: 01/02/1980    Years since quitting: 38.7  . Smokeless tobacco: Never Used  Substance Use Topics  . Alcohol use: No  . Drug use: No     Allergies   Sulfa antibiotics and Sulfonamide derivatives   Review of Systems Review of Systems  Genitourinary: Positive for decreased urine volume and difficulty urinating.  All other systems reviewed and are negative.    Physical Exam Updated Vital Signs BP (!) 143/95 (BP Location: Left Arm)   Pulse 88   Temp 98.6  F (37 C) (Oral)   Resp 15   Ht 5\' 10"  (1.778 m)   Wt 78.5 kg   SpO2 98%   BMI 24.82 kg/m   Physical Exam  Constitutional: He is oriented to person, place, and time. He appears well-developed and well-nourished.  NAD  HENT:  Head: Normocephalic and atraumatic.  Mouth/Throat: Oropharynx is clear and moist.  Eyes: Pupils are equal, round, and reactive to light. Conjunctivae and EOM are normal.  Neck: Normal range of motion.  Cardiovascular: Normal rate, regular rhythm and normal heart sounds.  Pulmonary/Chest: Effort normal and breath sounds normal. No stridor. No respiratory distress.  Abdominal: Soft. Bowel sounds are normal. There is no tenderness. There is no rebound.  Multiple well healed abdominal scars, no significant tenderness or distention, mild fullness of the bladder noted  Musculoskeletal: Normal range of motion.  Neurological: He is alert and oriented to person, place, and time.  Skin: Skin is warm and dry.  Psychiatric: He has a normal mood and affect.  Nursing note and vitals reviewed.    ED Treatments / Results  Labs (all labs ordered are listed, but only abnormal results are displayed) Labs Reviewed  URINALYSIS, ROUTINE W REFLEX MICROSCOPIC - Abnormal; Notable for the following components:      Result Value   Hgb urine dipstick LARGE (*)    Leukocytes, UA MODERATE (*)    Bacteria, UA RARE (*)    All other components within normal limits  I-STAT CHEM 8, ED - Abnormal; Notable for the following components:   BUN 26 (*)    Creatinine, Ser 1.50 (*)    Glucose, Bld 109 (*)    Hemoglobin 10.9 (*)    HCT 32.0 (*)    All other components within normal limits  URINE CULTURE    EKG None  Radiology No results found.  Procedures Procedures (including critical care time)  Medications Ordered in ED Medications  lidocaine (XYLOCAINE) 2 % jelly 1 application (1 application Urethral Given 09/10/18 0217)     Initial Impression / Assessment and Plan / ED  Course  I have reviewed the triage vital signs and the nursing notes.  Pertinent labs & imaging results that were available during my care of the patient were reviewed by me and considered in my medical decision making (see chart for details).  84 year old male here with urinary retention.  Has had multiple urinary issues over the past month or so.  He is followed by urology, Dr. Gloriann Loan and recently had bladder neck tumor removed.  States he has not had any meaningful urination all day today.  He is generally incontinent and has been wearing incontinence pads but has not had any urine in them.  He denies any fever, chills, nausea, vomiting, or significant abdominal pain.  Patient is afebrile and nontoxic in appearance.  He does have some mild fullness over his bladder.  Bladder scan was performed with about 500 cc in the bladder.  Foley catheter was placed with adequate urine drainage.  Serum creatinine remains near his baseline.  UA with moderate leuks.  Repeat urine culture were sent.  Patient is currently still on Keflex from ED visit a few days ago, however urine culture at that time was negative.  We will have him continue Keflex for now and if second urine culture comes back negative he can likely stop this.  We will have him follow-up with his urologist, Dr. Gloriann Loan.  He will return here for any new or worsening symptoms.  Patient seen and evaluated with attending physician, Dr. Betsey Holiday, who agrees with assessment and plan of care.  Final Clinical Impressions(s) / ED Diagnoses   Final diagnoses:  Urinary retention    ED Discharge Orders    None       Larene Pickett, PA-C 09/10/18 0507    Orpah Greek, MD 09/11/18 [phone]

## 2018-09-10 NOTE — ED Notes (Signed)
Bed: WA21 Expected date:  Expected time:  Means of arrival:  Comments: Room 2

## 2018-09-10 NOTE — ED Notes (Signed)
Pt given leg bag. Pt reported understanding of dc instructions to continue antibiotic and to call urology in AM

## 2018-09-10 NOTE — ED Notes (Signed)
Bed: WA02 Expected date:  Expected time:  Means of arrival:  Comments: 

## 2018-09-10 NOTE — ED Triage Notes (Signed)
Pt reports having urinary retention. Pt states he only urinated once yesterday. Bladder scan showed 457ml in bladder.

## 2018-09-10 NOTE — Discharge Instructions (Signed)
Leave foley catheter in place for now.  Continue your keflex. Follow-up with Dr. Gloriann Loan in the urology clinic-- call in the morning for appt. Return here for any new/acute changes.

## 2018-09-11 LAB — URINE CULTURE: CULTURE: NO GROWTH

## 2018-09-18 ENCOUNTER — Encounter (HOSPITAL_COMMUNITY): Payer: Self-pay | Admitting: *Deleted

## 2018-09-18 ENCOUNTER — Emergency Department (HOSPITAL_COMMUNITY)
Admission: EM | Admit: 2018-09-18 | Discharge: 2018-09-18 | Disposition: A | Discharge status: Home / Self Care | Payer: Medicare Other | Attending: Emergency Medicine | Admitting: Emergency Medicine

## 2018-09-18 DIAGNOSIS — R338 Other retention of urine: Secondary | ICD-10-CM | POA: Diagnosis not present

## 2018-09-18 DIAGNOSIS — Z87891 Personal history of nicotine dependence: Secondary | ICD-10-CM | POA: Diagnosis not present

## 2018-09-18 DIAGNOSIS — I10 Essential (primary) hypertension: Secondary | ICD-10-CM | POA: Diagnosis not present

## 2018-09-18 DIAGNOSIS — R339 Retention of urine, unspecified: Secondary | ICD-10-CM | POA: Diagnosis not present

## 2018-09-18 DIAGNOSIS — Z7982 Long term (current) use of aspirin: Secondary | ICD-10-CM | POA: Diagnosis not present

## 2018-09-18 DIAGNOSIS — Z79899 Other long term (current) drug therapy: Secondary | ICD-10-CM | POA: Insufficient documentation

## 2018-09-18 DIAGNOSIS — R310 Gross hematuria: Secondary | ICD-10-CM | POA: Insufficient documentation

## 2018-09-18 DIAGNOSIS — Z85828 Personal history of other malignant neoplasm of skin: Secondary | ICD-10-CM | POA: Diagnosis not present

## 2018-09-18 LAB — URINALYSIS, ROUTINE W REFLEX MICROSCOPIC: RBC / HPF: >50 RBC/hpf — ABNORMAL HIGH (ref 0–5)

## 2018-09-18 LAB — CBC
HEMATOCRIT: 32 % — AB (ref 39.0–52.0)
HEMOGLOBIN: 10.5 g/dL — AB (ref 13.0–17.0)
MCH: 32 pg (ref 26.0–34.0)
MCHC: 32.8 g/dL (ref 30.0–36.0)
MCV: 97.6 fL (ref 80.0–100.0)
Platelets: 187 10*3/uL (ref 150–400)
RBC: 3.28 MIL/uL — ABNORMAL LOW (ref 4.22–5.81)
RDW: 14.1 % (ref 11.5–15.5)
WBC: 8.1 10*3/uL (ref 4.0–10.5)
nRBC: 0 % (ref 0.0–0.2)

## 2018-09-18 LAB — BASIC METABOLIC PANEL WITH GFR
Anion gap: 6 (ref 5–15)
BUN: 29 mg/dL — ABNORMAL HIGH (ref 8–23)
CO2: 26 mmol/L (ref 22–32)
Calcium: 9.3 mg/dL (ref 8.9–10.3)
Chloride: 105 mmol/L (ref 98–111)
Creatinine, Ser: 1.75 mg/dL — ABNORMAL HIGH (ref 0.61–1.24)
GFR calc Af Amer: 40 mL/min — ABNORMAL LOW
GFR calc non Af Amer: 35 mL/min — ABNORMAL LOW
Glucose, Bld: 96 mg/dL (ref 70–99)
Potassium: 3.6 mmol/L (ref 3.5–5.1)
Sodium: 137 mmol/L (ref 135–145)

## 2018-09-18 LAB — PROTIME-INR
INR: 1.06
Prothrombin Time: 13.7 seconds (ref 11.4–15.2)

## 2018-09-18 NOTE — ED Notes (Signed)
ED Provider at bedside. 

## 2018-09-18 NOTE — ED Notes (Signed)
Leg bag placed on pt for cath. Pt verbalizes understanding of catheter care. Pt denies need for larger standard drainage bag.

## 2018-09-18 NOTE — ED Triage Notes (Signed)
Pt complains of urinary retention. Pt had catheter removed 2 days ago, states he last urinated yesterday. Pt had catheter for a week for retention last week.

## 2018-09-18 NOTE — ED Notes (Signed)
Bladder Irrigation set up at bedside.

## 2018-09-18 NOTE — Consult Note (Signed)
Urology Consult   Physician requesting consult: Dr. Johnney Killian  Reason for consult: Urinary retention and hematuria  History of Present Illness: [name] is a 84 y.o. who underwent a TURBT and TUIBNC by Dr. Gloriann Loan a few weeks ago.  He developed urinary retention postoperatively and had a catheter until a voiding trial 2 days ago.  He presented to the ED this morning with an inability to void.  A catheter was place with return of 500 cc of grossly bloody urine.  He denies fever or other complaints.  He is still having some pain and discomfort.   Past Medical History:  Diagnosis Date  . Adenocarcinoma of colon ([place])   . Arthritis   . Diverticulitis   . Gout    no recent flare  . Hemorrhoids   . History of blood transfusion   . Hypertension   . Incontinence   . Peripheral vascular disease ([place]) 2011   DVT right leg  . Prostate carcinoma ([place])   . Wears dentures    upper   . Wears glasses     Past Surgical History:  Procedure Laterality Date  . COLON SURGERY    . COLONOSCOPY N/A 11/17/2013   Procedure: COLONOSCOPY;  Surgeon: Jeryl Columbia, MD;  [facility]: [facility];  Service: Endoscopy;  Laterality: N/A;  . CYSTOSCOPY W/ RETROGRADES Bilateral 08/16/2018   Procedure: CYSTOSCOPY WITH BILATERAL  RETROGRADE PYELOGRAM;  Surgeon: Lucas Mallow, MD;  Location: [facility];  Service: Urology;  Laterality: Bilateral;  . diverticulitis    . EYE SURGERY Bilateral    cataract extraction with IOL  . HERNIA REPAIR  1999  . HOT HEMOSTASIS N/A 11/17/2013   Procedure: HOT HEMOSTASIS (ARGON PLASMA COAGULATION/BICAP);  Surgeon: Jeryl Columbia, MD;  Location: [facility];  Service: Endoscopy;  Laterality: N/A;  . ILEOSTOMY    . LAPAROSCOPIC LYSIS OF ADHESIONS N/A 01/06/2014   Procedure: LAPAROSCOPIC LYSIS OF ADHESIONS;  Surgeon: Odis Hollingshead, MD;  Location: [facility];  Service: General;  Laterality: N/A;  . LAPAROSCOPIC PARTIAL COLECTOMY N/A 01/06/2014   Procedure: LAPAROSCOPIC ASSISTED  PARTIAL COLECTOMY;  Surgeon: Odis Hollingshead, MD;  Location: [facility];  Service: General;  Laterality: N/A;  . [place]  . TONSILLECTOMY    . TRANSURETHRAL RESECTION OF BLADDER TUMOR N/A 08/16/2018   Procedure: TRANSURETHRAL RESECTION OF BLADDER TUMOR (TURBT);  Surgeon: Lucas Mallow, MD;  Location: [facility];  Service: Urology;  Laterality: N/A;    Medications:  Home meds:  No current facility-administered medications on file prior to encounter.    Current Outpatient Medications on File Prior to Encounter  Medication Sig Dispense Refill  . acetaminophen (TYLENOL) 500 MG tablet Take 1,000 mg by mouth every 8 (eight) hours as needed for mild pain.    Marland Kitchen allopurinol (ZYLOPRIM) 300 MG tablet Take 300 mg by mouth every evening.     Marland Kitchen aspirin EC 81 MG tablet Take 81 mg by mouth daily.    . carvedilol (COREG) 6.25 MG tablet Take 6.25 mg by mouth 2 (two) times daily with a meal.      . clotrimazole-betamethasone (LOTRISONE) cream Apply 1 application topically 2 (two) times daily.   0  . hydrochlorothiazide (HYDRODIURIL) 50 MG tablet Take 50 mg by mouth daily.     Marland Kitchen loperamide (IMODIUM) 2 MG capsule Take 2 mg by mouth as needed for diarrhea or loose stools.    . Multiple Vitamin (MULTIVITAMIN WITH MINERALS) TABS tablet Take 1  tablet by mouth daily.    . potassium chloride SA (K-DUR,KLOR-CON) 20 MEQ tablet Take 20 mEq by mouth every evening.   2  . cephALEXin (KEFLEX) 500 MG capsule Take 1 capsule (500 mg total) by mouth 3 (three) times daily. (Patient not taking: Reported on 09/18/2018) 21 capsule 0  . HYDROcodone-acetaminophen (NORCO/VICODIN) 5-325 MG tablet Take 1 tablet by mouth every 4 (four) hours as needed for moderate pain. (Patient not taking: Reported on 09/18/2018) 8 tablet 0     Scheduled Meds: Continuous Infusions: PRN Meds:.  Allergies:  Allergies  Allergen Reactions  . Sulfa Antibiotics Rash  . Sulfonamide Derivatives Rash    No family history on  file.  Social History:  reports that he quit smoking about 38 years ago. His smoking use included cigarettes and pipe. He has never used smokeless tobacco. He reports that he does not drink alcohol or use drugs.  ROS: A complete review of systems was performed.  All systems are negative except for pertinent findings as noted.  Physical Exam:  Vital signs in last 24 hours: Temp:  [97.8 F (36.6 C)] 97.8 F (36.6 C) (11/27 1057) Pulse Rate:  [87-93] 88 (11/27 1307) Resp:  [15-16] 16 (11/27 1307) BP: (141-186)/(90-108) 141/91 (11/27 1307) SpO2:  [98 %-100 %] 100 % (11/27 1307) Constitutional:  Alert and oriented, No acute distress Cardiovascular: Regular rate and rhythm, No JVD Respiratory: Normal respiratory effort, Lungs clear bilaterally GI: Abdomen is soft, nondistended Genitourinary: He has an indwelling 16 Fr catheter with grossly bloody urine.  His catheter does appear to be extending out of his urethra more than would be expected. Lymphatic: No lymphadenopathy Neurologic: Grossly intact, no focal deficits Psychiatric: Normal mood and affect  Laboratory Data:  Recent Labs    09/18/18 1324  WBC 8.1  HGB 10.5*  HCT 32.0*  PLT 187    No results for input(s): NA, K, CL, GLUCOSE, BUN, CALCIUM, CREATININE in the last 72 hours.  Invalid input(s): CO3   Results for orders placed or performed during the hospital encounter of 09/18/18 (from the past 24 hour(s))  Urinalysis, Routine w reflex microscopic- may I&O cath if menses     Status: Abnormal   Collection Time: 09/18/18 12:14 PM  Result Value Ref Range   Color, Urine RED (A) YELLOW   APPearance TURBID (A) CLEAR   Specific Gravity, Urine  1.005 - 1.030    TEST NOT REPORTED DUE TO COLOR INTERFERENCE OF URINE PIGMENT   pH  5.0 - 8.0    TEST NOT REPORTED DUE TO COLOR INTERFERENCE OF URINE PIGMENT   Glucose, UA (A) NEGATIVE mg/dL    TEST NOT REPORTED DUE TO COLOR INTERFERENCE OF URINE PIGMENT   Hgb urine dipstick (A)  NEGATIVE    TEST NOT REPORTED DUE TO COLOR INTERFERENCE OF URINE PIGMENT   Bilirubin Urine (A) NEGATIVE    TEST NOT REPORTED DUE TO COLOR INTERFERENCE OF URINE PIGMENT   Ketones, ur (A) NEGATIVE mg/dL    TEST NOT REPORTED DUE TO COLOR INTERFERENCE OF URINE PIGMENT   Protein, ur (A) NEGATIVE mg/dL    TEST NOT REPORTED DUE TO COLOR INTERFERENCE OF URINE PIGMENT   Nitrite (A) NEGATIVE    TEST NOT REPORTED DUE TO COLOR INTERFERENCE OF URINE PIGMENT   Leukocytes, UA (A) NEGATIVE    TEST NOT REPORTED DUE TO COLOR INTERFERENCE OF URINE PIGMENT   RBC / HPF >50 (H) 0 - 5 RBC/hpf   WBC, UA 6-10 0 - 5  WBC/hpf   Bacteria, UA RARE (A) NONE SEEN  CBC     Status: Abnormal   Collection Time: 09/18/18  1:24 PM  Result Value Ref Range   WBC 8.1 4.0 - 10.5 K/uL   RBC 3.28 (L) 4.22 - 5.81 MIL/uL   Hemoglobin 10.5 (L) 13.0 - 17.0 g/dL   HCT 32.0 (L) 39.0 - 52.0 %   MCV 97.6 80.0 - 100.0 fL   MCH 32.0 26.0 - 34.0 pg   MCHC 32.8 30.0 - 36.0 g/dL   RDW 14.1 11.5 - 15.5 %   Platelets 187 150 - 400 K/uL   nRBC 0.0 0.0 - 0.2 %   Recent Results (from the past 240 hour(s))  Urine culture     Status: None   Collection Time: 09/10/18  2:33 AM  Result Value Ref Range Status   Specimen Description   Final    URINE, RANDOM Performed at [facility], [place] [address] [place], [place] [address]    Special Requests   Final    NONE Performed at [facility], [place] [address], [place], [place] [address]    Culture   Final    NO GROWTH Performed at [facility], [place] [address], [place], [place] [address]    Report Status 09/11/2018 FINAL  Final    Renal Function: No results for input(s): CREATININE in the last 168 hours. Estimated Creatinine Clearance: 37.2 mL/min (A) (by C-G formula based on SCr of 1.5 mg/dL (H)).  Procedure: I took his catheter balloon and attempted to advance it but it appeared to be curling in his prostatic urethra suggesting it was not in  appropriate position.  I removed this catheter and placed a new 18 Fr coude catheter under sterile conditions.  This was placed without difficulty and return of pink urine that drained readily.  I hand irrigated this catheter and it irrigated well and without significant clots.  Impression/Recommendation Urinary retention:  Discharge with indwelling catheter.  I will notify Dr. Gloriann Loan so he can arrange appropriate follow up.  Will hold any antibiotic therapy pending urine culture results.  Anniyah Mood,LES 09/18/2018, 1:40 PM    Pryor Curia MD  CC: Dr. Johnney Killian

## 2018-09-18 NOTE — ED Provider Notes (Signed)
[facility] DEPT Provider Note   CSN: [id number] Arrival date & time: 09/18/18  1043     History   Chief Complaint Chief Complaint  Patient presents with  . Urinary Retention    HPI [name] is a 84 y.o. male.  HPI Patient reports that he has had a tumor resected from his bladder 10\25\2019.  He reports that he had a catheter in place after the procedure.  He reports he was doing alright but then was diagnosed with a urinary tract infection 11\15.  He was taking Keflex.  On 11/19, he was seen in the emergency department for incontinence and then decreased urinary output.  Bladder scan showed retention of 500 cc in the bladder.  A Foley catheter was placed.  Patient reports that that was draining well and he was not having problems.  It was removed 2 days ago the urologist office and he was able to urinate afterwards.  He reports starting last night however his urine output decreased and he has not been putting out urine and developed a lot of pressure over the bladder.  He denies that when he had the catheter in place he was having bloody drainage or discharge. Past Medical History:  Diagnosis Date  . Adenocarcinoma of colon ([place])   . Arthritis   . Diverticulitis   . Gout    no recent flare  . Hemorrhoids   . History of blood transfusion   . Hypertension   . Incontinence   . Peripheral vascular disease ([place]) 2011   DVT right leg  . Prostate carcinoma ([place])   . Wears dentures    upper   . Wears glasses     Patient Active Problem List   Diagnosis Date Noted  .  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 Colon cancer ([place]) 12/15/2013  . Acute lower GI bleeding 11/14/2013  . History of pulmonary embolism 11/14/2013  . LOCALIZED SUPERFICIAL SWELLING MASS OR LUMP 09/30/2010  . DIVERTICULAR DISEASE 06/21/2009  . ACNE ROSACEA 05/12/2009  . ARTHRALGIA 05/12/2009  . Gout, unspecified 05/11/2008  . HYPERTENSION  05/11/2008  . KIDNEY DISEASE, CHRONIC NOS 05/11/2008  . COUGH 04/17/2007  . ESOPHAGEAL STRICTURE 01/23/2007  . ADENOCARCINOMA, PROSTATE, HX OF 01/23/2007    Past Surgical History:  Procedure Laterality Date  . COLON SURGERY    . COLONOSCOPY N/A 11/17/2013   Procedure: COLONOSCOPY;  Surgeon: Jeryl Columbia, MD;  Location: [facility];  Service: Endoscopy;  Laterality: N/A;  . CYSTOSCOPY W/ RETROGRADES Bilateral 08/16/2018   Procedure: CYSTOSCOPY WITH BILATERAL  RETROGRADE PYELOGRAM;  Surgeon: Lucas Mallow, MD;  Location: [facility];  Service: Urology;  Laterality: Bilateral;  . diverticulitis    . EYE SURGERY Bilateral    cataract extraction with IOL  . HERNIA REPAIR  1999  . HOT HEMOSTASIS N/A 11/17/2013   Procedure: HOT HEMOSTASIS (ARGON PLASMA COAGULATION/BICAP);  Surgeon: Jeryl Columbia, MD;  Location: [facility];  Service: Endoscopy;  Laterality: N/A;  . ILEOSTOMY    . LAPAROSCOPIC LYSIS OF ADHESIONS N/A 01/06/2014   Procedure: LAPAROSCOPIC LYSIS OF ADHESIONS;  Surgeon: Odis Hollingshead, MD;  Location[facility];  Service: General;  Laterality: N/A;  . LAPAROSCOPIC PARTIAL COLECTOMY N/A 01/06/2014   Procedure: LAPAROSCOPIC ASSISTED PARTIAL COLECTOMY;  Surgeon: Odis Hollingshead, MD;  Location[facility];  Service: General;  Laterality: N/A;  . [place]  . TONSILLECTOMY    . TRANSURETHRAL RESECTION OF  BLADDER TUMOR N/A 08/16/2018   Procedure: TRANSURETHRAL RESECTION OF BLADDER TUMOR (TURBT);  Surgeon: Lucas Mallow, MD;  [facility];  Service: Urology;  Laterality: N/A;        Home Medications    Prior to Admission medications   Medication Sig Start Date End Date Taking? Authorizing Provider  acetaminophen (TYLENOL) 500 MG tablet Take 1,000 mg by mouth every 8 (eight) hours as needed for mild pain.   Yes [provider]  allopurinol (ZYLOPRIM) 300 MG tablet Take 300 mg by mouth every evening.    Yes [provider]  aspirin EC 81 MG tablet  Take 81 mg by mouth daily.   Yes [provider]  carvedilol (COREG) 6.25 MG tablet Take 6.25 mg by mouth 2 (two) times daily with a meal.     Yes [provider]  clotrimazole-betamethasone (LOTRISONE) cream Apply 1 application topically 2 (two) times daily.  08/13/18  Yes [provider]  hydrochlorothiazide (HYDRODIURIL) 50 MG tablet Take 50 mg by mouth daily.    Yes [provider]  loperamide (IMODIUM) 2 MG capsule Take 2 mg by mouth as needed for diarrhea or loose stools.   Yes [provider]  Multiple Vitamin (MULTIVITAMIN WITH MINERALS) TABS tablet Take 1 tablet by mouth daily.   Yes [provider]  potassium chloride SA (K-DUR,KLOR-CON) 20 MEQ tablet Take 20 mEq by mouth every evening.  07/13/18  Yes [provider]  cephALEXin (KEFLEX) 500 MG capsule Take 1 capsule (500 mg total) by mouth 3 (three) times daily. Patient not taking: Reported on 09/18/2018 09/06/18   Lajean Saver, MD  HYDROcodone-acetaminophen (NORCO/VICODIN) 5-325 MG tablet Take 1 tablet by mouth every 4 (four) hours as needed for moderate pain. Patient not taking: Reported on 09/18/2018 08/16/18 08/16/19  Lucas Mallow, MD    Family History No family history on file.  Social History Social History   Tobacco Use  . Smoking status: Former Smoker    Types: Cigarettes, Pipe    Last attempt to quit: 01/02/1980    Years since quitting: 38.7  . Smokeless tobacco: Never Used  Substance Use Topics  . Alcohol use: No  . Drug use: No     Allergies   Sulfa antibiotics and Sulfonamide derivatives   Review of Systems Review of Systems 10 Systems reviewed and are negative for acute change except as noted in the HPI.   Physical Exam Updated Vital Signs BP (!) 141/91   Pulse 88   Temp 97.8 F (36.6 C) (Oral)   Resp 16   SpO2 100%   Physical Exam  Constitutional: He is oriented to person, place, and time.  Patient is alert and nontoxic.   Clinically well in appearance.  Catheter has already been placed.  He is much more comfortable.  HENT:  Head: Normocephalic and atraumatic.  Pulmonary/Chest: Effort normal.  Abdominal: Soft. He exhibits no distension. There is no tenderness. There is no guarding.  Genitourinary: Penis normal.  Genitourinary Comments: Penis is normal appearance.  He has a catheter in place now fluid in the catheter is bloody with some clot intermingled.  Musculoskeletal: Normal range of motion. He exhibits no edema or tenderness.  Neurological: He is alert and oriented to person, place, and time. He exhibits normal muscle tone. Coordination normal.  Skin: Skin is warm and dry.  Psychiatric: He has a normal mood and affect.       ED Treatments / Results  Labs (all  labs ordered are listed, but only abnormal results are displayed) Labs Reviewed  URINALYSIS, ROUTINE W REFLEX MICROSCOPIC - Abnormal; Notable for the following components:      Result Value   Color, Urine RED (*)    APPearance TURBID (*)    Glucose, UA   (*)    Value: TEST NOT REPORTED DUE TO COLOR INTERFERENCE OF URINE PIGMENT   Hgb urine dipstick   (*)    Value: TEST NOT REPORTED DUE TO COLOR INTERFERENCE OF URINE PIGMENT   Bilirubin Urine   (*)    Value: TEST NOT REPORTED DUE TO COLOR INTERFERENCE OF URINE PIGMENT   Ketones, ur   (*)    Value: TEST NOT REPORTED DUE TO COLOR INTERFERENCE OF URINE PIGMENT   Protein, ur   (*)    Value: TEST NOT REPORTED DUE TO COLOR INTERFERENCE OF URINE PIGMENT   Nitrite   (*)    Value: TEST NOT REPORTED DUE TO COLOR INTERFERENCE OF URINE PIGMENT   Leukocytes, UA   (*)    Value: TEST NOT REPORTED DUE TO COLOR INTERFERENCE OF URINE PIGMENT   RBC / HPF >50 (*)    Bacteria, UA RARE (*)    All other components within normal limits  BASIC METABOLIC PANEL - Abnormal; Notable for the following components:   BUN 29 (*)    Creatinine, Ser 1.75 (*)    GFR calc non Af Amer 35 (*)    GFR calc Af Amer 40 (*)      All other components within normal limits  CBC - Abnormal; Notable for the following components:   RBC 3.28 (*)    Hemoglobin 10.5 (*)    HCT 32.0 (*)    All other components within normal limits  PROTIME-INR    EKG None  Radiology No results found.  Procedures Procedures (including critical care time)  Medications Ordered in ED Medications - No data to display   Initial Impression / Assessment and Plan / ED Course  I have reviewed the triage vital signs and the nursing notes.  Pertinent labs & imaging results that were available during my care of the patient were reviewed by me and considered in my medical decision making (see chart for details).  Clinical Course as of Sep 18 1425  Wed Sep 18, 2018  1237 Consult to urology ordered.   [MP]  2376 Consult: Reviewed with Dr. Alinda Money.  He will see the patient in the emergency department to irrigate foley and determine final disposition.   [MP]    Clinical Course User Index [MP] Charlesetta Shanks, MD   Patient presents with urinary retention.  Dr. Alinda Money has evaluated the patient.  He has evaluated the catheter and replaced with coud catheter.  He advises first catheter was likely in the prostatic urethra.  He has subsequently irrigated and is satisfied with clearing of blood.  He advises patient stable for discharge and they will follow-up on urine culture results and catheter care.  Final Clinical Impressions(s) / ED Diagnoses   Final diagnoses:  Urinary retention  Gross hematuria    ED Discharge Orders    None       Charlesetta Shanks, MD 09/18/18 1428

## 2018-09-20 ENCOUNTER — Other Ambulatory Visit: Payer: Self-pay | Admitting: *Deleted

## 2018-09-20 LAB — URINE CULTURE

## 2018-09-20 NOTE — Patient Outreach (Signed)
[facility]) Care Management  09/20/2018  [name] Jun 17, 1931 [id number]   Telephone Screen  Referral Date:  09/20/2018 Referral Source:  [facility] ED Census Reason for Referral:  6 or more ED visits in the past 6 months Insurance:  Medicare   Outreach Attempt:   Successful telephone outreach to patient for telephone screening.  Patient request RN Health Coach speak with him and wife.  HIPAA verified with wife per patient's request.  [facility] services reviewed and discussed with patient and wife.  Wife declines screening and declines services at this time.  States they have [employer] available for any assistance they need and does not want to double services.  Attempted to offer to send brochure for review and future needs, telephone was disconnected before [facility] could extend offer.  Plan:  RN Health Coach will close case based on patient and wife declining services at this time.   [place] [phone] [email or web address]

## 2018-09-21 ENCOUNTER — Telehealth: Payer: Self-pay

## 2018-09-21 NOTE — Telephone Encounter (Signed)
Spoke with Pt for Symptom check from ED visit at [facility] on 09/18/18  Denies any problems, just wants to get Foley out as soon as possible. No appt yet with [facility] but he is going to call on Monday.  Denied need for ABX treatment.

## 2018-09-21 NOTE — Progress Notes (Signed)
ED Antimicrobial Stewardship Positive Culture Follow Up   [name] is an 84 y.o. male who presented to [facility] on 09/18/2018 with a chief complaint of  Chief Complaint  Patient presents with  . Urinary Retention    Recent Results (from the past 720 hour(s))  Urine culture     Status: None   Collection Time: 09/03/18  6:45 PM  Result Value Ref Range Status   Specimen Description   Final    URINE, RANDOM Performed at [facility] [address] [place], [place] [address]    Special Requests   Final    NONE Performed at [facility], [place] [address] [place], [place] [address]    Culture   Final    NO GROWTH Performed at [facility], [place] [address] [place], [place] [address]    Report Status 09/05/2018 FINAL  Final  Urine culture     Status: None   Collection Time: 09/10/18  2:33 AM  Result Value Ref Range Status   Specimen Description   Final    URINE, RANDOM Performed at [facility] [address], [place], [place] [address]    Special Requests   Final    NONE Performed at [facility], [place] [address] [place], [place] [address]    Culture   Final    NO GROWTH Performed at [facility], [place] [address] [place], [place] [address]    Report Status 09/11/2018 FINAL  Final  Urine culture     Status: Abnormal   Collection Time: 09/18/18 12:14 PM  Result Value Ref Range Status   Specimen Description   Final    Urine Performed at [facility] [address] [place], [place] [address]    Special Requests   Final    NONE Performed at [facility], [place] [address], [place], [place] [address]    Culture 30,000 COLONIES/mL KLEBSIELLA OXYTOCA (A)  Final   Report Status 09/20/2018 FINAL  Final   Organism ID, Bacteria KLEBSIELLA OXYTOCA (A)  Final      Susceptibility   Klebsiella oxytoca - MIC*    AMPICILLIN >=32 RESISTANT Resistant      CEFAZOLIN 16 SENSITIVE Sensitive     CEFEPIME <=1 SENSITIVE Sensitive     CEFTAZIDIME <=1 SENSITIVE Sensitive     CEFTRIAXONE <=1 SENSITIVE Sensitive     CIPROFLOXACIN >=4 RESISTANT Resistant     GENTAMICIN <=1 SENSITIVE Sensitive     IMIPENEM <=0.25 SENSITIVE Sensitive     TRIMETH/SULFA >=320 RESISTANT Resistant     AMPICILLIN/SULBACTAM 16 INTERMEDIATE Intermediate     PIP/TAZO 8 SENSITIVE Sensitive     Extended ESBL NEGATIVE Sensitive     * 30,000 COLONIES/mL KLEBSIELLA OXYTOCA    []  Treated with N/A, organism resistant to prescribed antimicrobial [x]  Patient discharged originally without antimicrobial agent and treatment is now indicated  New antibiotic prescription: If pt with UTI symptoms, start cephalexin 500mg  PO BID x 7 days  ED Provider: Providence Lanius, PA   Yanessa Hocevar, Rande Lawman 09/21/2018, 9:24 AM Clinical Pharmacist Monday - Friday phone -  [phone] Saturday - Sunday phone - [phone]

## 2018-09-30 ENCOUNTER — Telehealth: Payer: Self-pay | Admitting: *Deleted

## 2018-09-30 NOTE — Telephone Encounter (Signed)
[id number] with patient concerning ongoing urinary retention problems/ does still have foley which is flowing well without clots/has a md this week he believes is on Thursday/will call if unable to void or the foley becomes a problem.Marland KitchenSuanne Marker Penny Frisbie,BSN,CCM,RN3,CN

## 2018-10-10 ENCOUNTER — Encounter (HOSPITAL_COMMUNITY): Payer: Self-pay | Admitting: Emergency Medicine

## 2018-10-10 ENCOUNTER — Emergency Department (HOSPITAL_COMMUNITY)
Admission: EM | Admit: 2018-10-10 | Discharge: 2018-10-10 | Disposition: A | Discharge status: Home / Self Care | Payer: Medicare Other | Attending: Emergency Medicine | Admitting: Emergency Medicine

## 2018-10-10 DIAGNOSIS — N3000 Acute cystitis without hematuria: Secondary | ICD-10-CM

## 2018-10-10 DIAGNOSIS — I10 Essential (primary) hypertension: Secondary | ICD-10-CM | POA: Diagnosis not present

## 2018-10-10 DIAGNOSIS — Z85038 Personal history of other malignant neoplasm of large intestine: Secondary | ICD-10-CM | POA: Diagnosis not present

## 2018-10-10 DIAGNOSIS — R339 Retention of urine, unspecified: Secondary | ICD-10-CM | POA: Insufficient documentation

## 2018-10-10 DIAGNOSIS — Z86718 Personal history of other venous thrombosis and embolism: Secondary | ICD-10-CM | POA: Diagnosis not present

## 2018-10-10 DIAGNOSIS — Z8546 Personal history of malignant neoplasm of prostate: Secondary | ICD-10-CM | POA: Diagnosis not present

## 2018-10-10 DIAGNOSIS — R300 Dysuria: Secondary | ICD-10-CM | POA: Insufficient documentation

## 2018-10-10 DIAGNOSIS — Z79899 Other long term (current) drug therapy: Secondary | ICD-10-CM | POA: Diagnosis not present

## 2018-10-10 DIAGNOSIS — Z87891 Personal history of nicotine dependence: Secondary | ICD-10-CM | POA: Insufficient documentation

## 2018-10-10 DIAGNOSIS — R350 Frequency of micturition: Secondary | ICD-10-CM | POA: Diagnosis not present

## 2018-10-10 LAB — URINALYSIS, ROUTINE W REFLEX MICROSCOPIC
Bilirubin Urine: NEGATIVE
Glucose, UA: NEGATIVE mg/dL
Ketones, ur: NEGATIVE mg/dL
Nitrite: POSITIVE — AB
PH: 5 (ref 5.0–8.0)
Protein, ur: 30 mg/dL — AB
SPECIFIC GRAVITY, URINE: 1.008 (ref 1.005–1.030)

## 2018-10-10 MED ORDER — CEPHALEXIN 500 MG PO CAPS: 500.0000 mg | ORAL_CAPSULE | Freq: Three times a day (TID) | ORAL | 0 refills | Status: DC

## 2018-10-10 MED ORDER — CEPHALEXIN 500 MG PO CAPS
500.0000 mg | ORAL_CAPSULE | Freq: Once | ORAL | Status: AC
  Administered 2018-10-10: 500 mg via ORAL
  Filled 2018-10-10: qty 1

## 2018-10-10 NOTE — ED Provider Notes (Addendum)
[facility] DEPT Provider Note   CSN: [id number] Arrival date & time: 10/10/18  0107  Time seen 3:07 AM.   History   Chief Complaint Chief Complaint  Patient presents with  . Urinary Retention    HPI [name] is a 84 y.o. male.  HPI patient states he had some dysuria today and had lost the ability to urinate today.  He states he had a Foley catheter removed on December 13 after having had it in for 11 days.  He states he did not have abdominal pain, no nausea, no vomiting, or fever.  He states after he got to the ED all of a sudden there was a rush of urine and he is now able to urinate.  PCP Tisovec, Fransico Him, MD Urology Dr Gloriann Loan  Past Medical History:  Diagnosis Date  . Adenocarcinoma of colon ([place])   . Arthritis   . Diverticulitis   . Gout    no recent flare  . Hemorrhoids   . History of blood transfusion   . Hypertension   . Incontinence   . Peripheral vascular disease ([place]) 2011   DVT right leg  . Prostate carcinoma ([place])   . Wears dentures    upper   . Wears glasses     Patient Active Problem List   Diagnosis Date Noted  .  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 Colon cancer ([place]) 12/15/2013  . Acute lower GI bleeding 11/14/2013  . History of pulmonary embolism 11/14/2013  . LOCALIZED SUPERFICIAL SWELLING MASS OR LUMP 09/30/2010  . DIVERTICULAR DISEASE 06/21/2009  . ACNE ROSACEA 05/12/2009  . ARTHRALGIA 05/12/2009  . Gout, unspecified 05/11/2008  . HYPERTENSION 05/11/2008  . KIDNEY DISEASE, CHRONIC NOS 05/11/2008  . COUGH 04/17/2007  . ESOPHAGEAL STRICTURE 01/23/2007  . ADENOCARCINOMA, PROSTATE, HX OF 01/23/2007    Past Surgical History:  Procedure Laterality Date  . COLON SURGERY    . COLONOSCOPY N/A 11/17/2013   Procedure: COLONOSCOPY;  Surgeon: Jeryl Columbia, MD;  [facility];  Service: Endoscopy;  Laterality: N/A;  . CYSTOSCOPY W/ RETROGRADES Bilateral 08/16/2018   Procedure: CYSTOSCOPY WITH BILATERAL  RETROGRADE PYELOGRAM;  Surgeon: Lucas Mallow, MD;  [facility];  Service: Urology;  Laterality: Bilateral;  . diverticulitis    . EYE SURGERY Bilateral    cataract extraction with IOL  . HERNIA REPAIR  1999  . HOT HEMOSTASIS N/A 11/17/2013   Procedure: HOT HEMOSTASIS (ARGON PLASMA COAGULATION/BICAP);  Surgeon: Jeryl Columbia, MD;  Location: [facility];  Service: Endoscopy;  Laterality: N/A;  . ILEOSTOMY    . LAPAROSCOPIC LYSIS OF ADHESIONS N/A 01/06/2014   Procedure: LAPAROSCOPIC LYSIS OF ADHESIONS;  Surgeon: Odis Hollingshead, MD;  [facility];  Service: General;  Laterality: N/A;  . LAPAROSCOPIC PARTIAL COLECTOMY N/A 01/06/2014   Procedure: LAPAROSCOPIC ASSISTED PARTIAL COLECTOMY;  Surgeon: Odis Hollingshead, MD;  Location[facility];  Service: General;  Laterality: N/A;  . [place]  . TONSILLECTOMY    . TRANSURETHRAL RESECTION OF BLADDER TUMOR N/A 08/16/2018   Procedure: TRANSURETHRAL RESECTION OF BLADDER TUMOR (TURBT);  Surgeon: Lucas Mallow, MD;  [facility];  Service: Urology;  Laterality: N/A;        Home Medications    Prior to Admission medications   Medication Sig Start Date End Date Taking? Authorizing Provider  acetaminophen (TYLENOL) 500 MG tablet Take 1,000 mg by mouth every 8 (eight)  hours as needed for mild pain.    [provider]  allopurinol (ZYLOPRIM) 300 MG tablet Take 300 mg by mouth every evening.     [provider]  aspirin EC 81 MG tablet Take 81 mg by mouth daily.    [provider]  carvedilol (COREG) 6.25 MG tablet Take 6.25 mg by mouth 2 (two) times daily with a meal.      [provider]  cephALEXin (KEFLEX) 500 MG capsule Take 1 capsule (500 mg total) by mouth 3 (three) times daily. 10/10/18   Rolland Porter, MD  clotrimazole-betamethasone (LOTRISONE) cream Apply 1 application topically 2 (two) times daily.  08/13/18   [provider]    hydrochlorothiazide (HYDRODIURIL) 50 MG tablet Take 50 mg by mouth daily.     [provider]  HYDROcodone-acetaminophen (NORCO/VICODIN) 5-325 MG tablet Take 1 tablet by mouth every 4 (four) hours as needed for moderate pain. Patient not taking: Reported on 09/18/2018 08/16/18 08/16/19  Marton Redwood III, MD  loperamide (IMODIUM) 2 MG capsule Take 2 mg by mouth as needed for diarrhea or loose stools.    [provider]  Multiple Vitamin (MULTIVITAMIN WITH MINERALS) TABS tablet Take 1 tablet by mouth daily.    [provider]  potassium chloride SA (K-DUR,KLOR-CON) 20 MEQ tablet Take 20 mEq by mouth every evening.  07/13/18   [provider]    Family History History reviewed. No pertinent family history.  Social History Social History   Tobacco Use  . Smoking status: Former Smoker    Types: Cigarettes, Pipe    Last attempt to quit: 01/02/1980    Years since quitting: 38.7  . Smokeless tobacco: Never Used  Substance Use Topics  . Alcohol use: No  . Drug use: No     Allergies   Sulfa antibiotics and Sulfonamide derivatives   Review of Systems Review of Systems  All other systems reviewed and are negative.    Physical Exam Updated Vital Signs Ht 5\' 10"  (1.778 m)   Wt 77.1 kg   BMI 24.39 kg/m   Physical Exam Vitals signs and nursing note reviewed.  Constitutional:      Appearance: Normal appearance.  HENT:     Head: Normocephalic and atraumatic.     Nose: Nose normal.  Eyes:     Extraocular Movements: Extraocular movements intact.     Conjunctiva/sclera: Conjunctivae normal.  Neck:     Musculoskeletal: Neck supple.  Cardiovascular:     Rate and Rhythm: Normal rate.  Pulmonary:     Effort: Pulmonary effort is normal. No respiratory distress.  Abdominal:     General: There is no distension.     Palpations: Abdomen is soft. There is no mass.     Tenderness: There is no abdominal tenderness.     Comments: Exam after foley  catheter  Musculoskeletal: Normal range of motion.  Skin:    General: Skin is warm and dry.  Neurological:     General: No focal deficit present.     Mental Status: He is alert and oriented to person, place, and time.  Psychiatric:        Mood and Affect: Mood normal.        Behavior: Behavior normal.        Thought Content: Thought content normal.      ED Treatments / Results  Labs (all labs ordered are listed, but only abnormal results are displayed)  Results for orders placed or performed during  the hospital encounter of 10/10/18  Urinalysis, Routine w reflex microscopic- may I&O cath if menses  Result Value Ref Range   Color, Urine YELLOW YELLOW   APPearance HAZY (A) CLEAR   Specific Gravity, Urine 1.008 1.005 - 1.030   pH 5.0 5.0 - 8.0   Glucose, UA NEGATIVE NEGATIVE mg/dL   Hgb urine dipstick LARGE (A) NEGATIVE   Bilirubin Urine NEGATIVE NEGATIVE   Ketones, ur NEGATIVE NEGATIVE mg/dL   Protein, ur 30 (A) NEGATIVE mg/dL   Nitrite POSITIVE (A) NEGATIVE   Leukocytes, UA LARGE (A) NEGATIVE   RBC / HPF 21-50 0 - 5 RBC/hpf   WBC, UA >50 (H) 0 - 5 WBC/hpf   Bacteria, UA MANY (A) NONE SEEN   Mucus PRESENT    Laboratory interpretation all normal except probable UTI   EKG None  Radiology No results found.  Procedures Procedures (including critical care time)  Component 3wk ago  Specimen Description Urine  Performed at [facility], [place] [address] [place], [place] [address]   Special Requests NONE  Performed at [facility], [place] [address] [place], [place] [address]   Culture 30,000 COLONIES/mL KLEBSIELLA OXYTOCAAbnormal    Report Status 09/20/2018 FINAL   Organism ID, Bacteria KLEBSIELLA OXYTOCAAbnormal    Resulting Agency [facility]  Susceptibility    Klebsiella oxytoca    MIC    AMPICILLIN >=32 RESIST... Resistant    AMPICILLIN/SULBACTAM 16 INTERMED... Intermediate    CEFAZOLIN 16 SENSITIVE  Sensitive    CEFEPIME  <=1 SENSITIVE  Sensitive    CEFTAZIDIME <=1 SENSITIVE  Sensitive    CEFTRIAXONE <=1 SENSITIVE  Sensitive    CIPROFLOXACIN >=4 RESISTANT  Resistant    Extended ESBL NEGATIVE  Sensitive    GENTAMICIN <=1 SENSITIVE  Sensitive    IMIPENEM <=0.25 SENS... Sensitive    PIP/TAZO 8 SENSITIVE  Sensitive    TRIMETH/SULFA >=320 RESIS... Resistant         Susceptibility Comments   Klebsiella oxytoca  30,000 COLONIES/mL KLEBSIELLA OXYTOCA      Specimen Collected: 09/18/18 12:14  Last Resulted: 09/20/18 07:53       Medications Ordered in ED Medications  cephALEXin (KEFLEX) capsule 500 mg (has no administration in time range)     Initial Impression / Assessment and Plan / ED Course  I have reviewed the triage vital signs and the nursing notes.  Pertinent labs & imaging results that were available during my care of the patient were reviewed by me and considered in my medical decision making (see chart for details).   Patient's initial bladder scan when he came to the ED showed 580 cc of urine.  Nurses do document he was able to void 150 cc.  After my exam however his bladder scan still shows 453 cc urine.  At this point it was felt he did need to have another Foley catheter inserted.  Patient's urine was consistent with probable urinary tract infection.  He had a urine culture done 3 weeks ago which showed Klebsiella oxytoca  That was sensitive to cephalosporins.  He was started on oral Keflex.  Nurse reports patient voided again and repeat bladder scan was 121.  We therefore did not proceed with a Foley catheter.   Final Clinical Impressions(s) / ED Diagnoses   Final diagnoses:  Urinary retention  Acute cystitis without hematuria    ED Discharge Orders         Ordered    cephALEXin (KEFLEX) 500 MG capsule  3 times daily     10/10/18 0404         Plan discharge  Rolland Porter, MD, Barbette Or, MD 10/10/18 6803    Rolland Porter, MD 10/10/18 [place], Roy,  MD 10/21/18 [phone]

## 2018-10-10 NOTE — ED Notes (Signed)
Bladder scanned showed 534ml.

## 2018-10-10 NOTE — ED Triage Notes (Signed)
Patient is complaining of not being able to urinate but is leaking. Patient just had a catheter removed on Friday.

## 2018-10-10 NOTE — Discharge Instructions (Addendum)
Unfortunately even though you are urinating some on your own your bladder is not emptying well.  Take the antibiotics until gone.  Please call Dr. Purvis Sheffield office in the morning to let them know you had to come back to the ED and get another catheter inserted.  Return to the ED if the catheter quits working, you get abdominal pain, vomiting, or fever.

## 2018-10-12 ENCOUNTER — Emergency Department (HOSPITAL_COMMUNITY)
Admission: EM | Admit: 2018-10-12 | Discharge: 2018-10-12 | Disposition: A | Discharge status: Home / Self Care | Payer: Medicare Other | Attending: Emergency Medicine | Admitting: Emergency Medicine

## 2018-10-12 ENCOUNTER — Encounter (HOSPITAL_COMMUNITY): Payer: Self-pay

## 2018-10-12 ENCOUNTER — Other Ambulatory Visit: Payer: Self-pay

## 2018-10-12 DIAGNOSIS — Z79899 Other long term (current) drug therapy: Secondary | ICD-10-CM | POA: Diagnosis not present

## 2018-10-12 DIAGNOSIS — I10 Essential (primary) hypertension: Secondary | ICD-10-CM | POA: Insufficient documentation

## 2018-10-12 DIAGNOSIS — R339 Retention of urine, unspecified: Secondary | ICD-10-CM | POA: Insufficient documentation

## 2018-10-12 DIAGNOSIS — Z8546 Personal history of malignant neoplasm of prostate: Secondary | ICD-10-CM | POA: Diagnosis not present

## 2018-10-12 DIAGNOSIS — Z87891 Personal history of nicotine dependence: Secondary | ICD-10-CM | POA: Diagnosis not present

## 2018-10-12 DIAGNOSIS — Z7982 Long term (current) use of aspirin: Secondary | ICD-10-CM | POA: Insufficient documentation

## 2018-10-12 LAB — I-STAT CHEM 8, ED
BUN: 25 mg/dL — ABNORMAL HIGH (ref 8–23)
CALCIUM ION: 1.26 mmol/L (ref 1.15–1.40)
CHLORIDE: 102 mmol/L (ref 98–111)
Creatinine, Ser: 1.4 mg/dL — ABNORMAL HIGH (ref 0.61–1.24)
Glucose, Bld: 97 mg/dL (ref 70–99)
HCT: 31 % — ABNORMAL LOW (ref 39.0–52.0)
Hemoglobin: 10.5 g/dL — ABNORMAL LOW (ref 13.0–17.0)
POTASSIUM: 3.2 mmol/L — AB (ref 3.5–5.1)
SODIUM: 136 mmol/L (ref 135–145)
TCO2: 23 mmol/L (ref 22–32)

## 2018-10-12 LAB — URINALYSIS, ROUTINE W REFLEX MICROSCOPIC
Bilirubin Urine: NEGATIVE
Glucose, UA: NEGATIVE mg/dL
Ketones, ur: NEGATIVE mg/dL
Nitrite: NEGATIVE
Protein, ur: NEGATIVE mg/dL
Specific Gravity, Urine: 1.01 (ref 1.005–1.030)
pH: 5 (ref 5.0–8.0)

## 2018-10-12 LAB — URINE CULTURE
Culture: >=100000 — AB
SPECIAL REQUESTS: NORMAL

## 2018-10-12 NOTE — ED Triage Notes (Signed)
States seen here 5 times for the same, and now unable to urinate since last night and now pain 10/10. No fever noted.

## 2018-10-12 NOTE — ED Provider Notes (Signed)
[facility] DEPT Provider Note   CSN: [id number] Arrival date & time: 10/12/18  1506     History   Chief Complaint Chief Complaint  Patient presents with  . Urinary Retention    HPI CHOSEN [name] is a 84 y.o. male.  Patient is a 84 year old male who presents with urinary retention.  He states he has not been able to urinate since this morning.  He has some pressure into his lower abdomen.  No nausea or vomiting.  No fevers.  He had a history of a bladder tumor removed in October of this year by Dr. Gloriann Loan with [facility].  He has had to have Foley catheters placed intermittently since that time.  Most recently he had one removed on December 13.  He was seen yesterday for urinary retention but was able to urinate prior to insertion of the catheter.  His urine has recently grown out Klebsiella and he was started on Keflex last night.     Past Medical History:  Diagnosis Date  . Adenocarcinoma of colon ([place])   . Arthritis   . Diverticulitis   . Gout    no recent flare  . Hemorrhoids   . History of blood transfusion   . Hypertension   . Incontinence   . Peripheral vascular disease ([place]) 2011   DVT right leg  . Prostate carcinoma ([employer])   . Wears dentures    upper   . Wears glasses     Patient Active Problem List   Diagnosis Date Noted  .  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 Colon cancer ([place]) 12/15/2013  . Acute lower GI bleeding 11/14/2013  . History of pulmonary embolism 11/14/2013  . LOCALIZED SUPERFICIAL SWELLING MASS OR LUMP 09/30/2010  . DIVERTICULAR DISEASE 06/21/2009  . ACNE ROSACEA 05/12/2009  . ARTHRALGIA 05/12/2009  . Gout, unspecified 05/11/2008  . HYPERTENSION 05/11/2008  . KIDNEY DISEASE, CHRONIC NOS 05/11/2008  . COUGH 04/17/2007  . ESOPHAGEAL STRICTURE 01/23/2007  . ADENOCARCINOMA, PROSTATE, HX OF 01/23/2007    Past Surgical History:  Procedure Laterality Date  . COLON  SURGERY    . COLONOSCOPY N/A 11/17/2013   Procedure: COLONOSCOPY;  Surgeon: Jeryl Columbia, MD;  Location: [facility];  Service: Endoscopy;  Laterality: N/A;  . CYSTOSCOPY W/ RETROGRADES Bilateral 08/16/2018   Procedure: CYSTOSCOPY WITH BILATERAL  RETROGRADE PYELOGRAM;  Surgeon: Lucas Mallow, MD;  Location: [facility];  Service: Urology;  Laterality: Bilateral;  . diverticulitis    . EYE SURGERY Bilateral    cataract extraction with IOL  . HERNIA REPAIR  1999  . HOT HEMOSTASIS N/A 11/17/2013   Procedure: HOT HEMOSTASIS (ARGON PLASMA COAGULATION/BICAP);  Surgeon: Jeryl Columbia, MD;  Location: [facility];  Service: Endoscopy;  Laterality: N/A;  . ILEOSTOMY    . LAPAROSCOPIC LYSIS OF ADHESIONS N/A 01/06/2014   Procedure: LAPAROSCOPIC LYSIS OF ADHESIONS;  Surgeon: Odis Hollingshead, MD;  Location: [facility];  Service: General;  Laterality: N/A;  . LAPAROSCOPIC PARTIAL COLECTOMY N/A 01/06/2014   Procedure: LAPAROSCOPIC ASSISTED PARTIAL COLECTOMY;  Surgeon: Odis Hollingshead, MD;  Location: [facility];  Service: General;  Laterality: N/A;  . [place]  . TONSILLECTOMY    . TRANSURETHRAL RESECTION OF BLADDER TUMOR N/A 08/16/2018   Procedure: TRANSURETHRAL RESECTION OF BLADDER TUMOR (TURBT);  Surgeon: Lucas Mallow, MD;  Location: [facility];  Service: Urology;  Laterality: N/A;  Home Medications    Prior to Admission medications   Medication Sig Start Date End Date Taking? Authorizing Provider  acetaminophen (TYLENOL) 500 MG tablet Take 1,000 mg by mouth every 8 (eight) hours as needed for mild pain.    [provider]  allopurinol (ZYLOPRIM) 300 MG tablet Take 300 mg by mouth every evening.     [provider]  aspirin EC 81 MG tablet Take 81 mg by mouth daily.    [provider]  carvedilol (COREG) 6.25 MG tablet Take 6.25 mg by mouth 2 (two) times daily with a meal.      [provider]  cephALEXin (KEFLEX) 500 MG capsule Take 1 capsule  (500 mg total) by mouth 3 (three) times daily. 10/10/18   Rolland Porter, MD  clotrimazole-betamethasone (LOTRISONE) cream Apply 1 application topically 2 (two) times daily.  08/13/18   [provider]  hydrochlorothiazide (HYDRODIURIL) 50 MG tablet Take 50 mg by mouth daily.     [provider]  HYDROcodone-acetaminophen (NORCO/VICODIN) 5-325 MG tablet Take 1 tablet by mouth every 4 (four) hours as needed for moderate pain. Patient not taking: Reported on 09/18/2018 08/16/18 08/16/19  Marton Redwood III, MD  loperamide (IMODIUM) 2 MG capsule Take 2 mg by mouth as needed for diarrhea or loose stools.    [provider]  Multiple Vitamin (MULTIVITAMIN WITH MINERALS) TABS tablet Take 1 tablet by mouth daily.    [provider]  potassium chloride SA (K-DUR,KLOR-CON) 20 MEQ tablet Take 20 mEq by mouth every evening.  07/13/18   [provider]    Family History History reviewed. No pertinent family history.  Social History Social History   Tobacco Use  . Smoking status: Former Smoker    Types: Cigarettes, Pipe    Last attempt to quit: 01/02/1980    Years since quitting: 38.8  . Smokeless tobacco: Never Used  Substance Use Topics  . Alcohol use: No  . Drug use: No     Allergies   Sulfa antibiotics and Sulfonamide derivatives   Review of Systems Review of Systems  Constitutional: Negative for chills, diaphoresis, fatigue and fever.  HENT: Negative for congestion, rhinorrhea and sneezing.   Eyes: Negative.   Respiratory: Negative for cough, chest tightness and shortness of breath.   Cardiovascular: Negative for chest pain and leg swelling.  Gastrointestinal: Positive for abdominal pain. Negative for blood in stool, diarrhea, nausea and vomiting.  Genitourinary: Positive for decreased urine volume. Negative for difficulty urinating, flank pain, frequency, hematuria, penile swelling, scrotal swelling and testicular pain.  Musculoskeletal:  Negative for arthralgias and back pain.  Skin: Negative for rash.  Neurological: Negative for dizziness, speech difficulty, weakness, numbness and headaches.     Physical Exam Updated Vital Signs BP (!) 160/110   Pulse 88   Temp 97.7 F (36.5 C) (Oral)   Resp 16   Ht 5\' 10"  (1.778 m)   Wt 77.1 kg   SpO2 100%   BMI 24.39 kg/m   Physical Exam Constitutional:      Appearance: He is well-developed.  HENT:     Head: Normocephalic and atraumatic.  Eyes:     Pupils: Pupils are equal, round, and reactive to light.  Neck:     Musculoskeletal: Normal range of motion and neck supple.  Cardiovascular:     Rate and Rhythm: Normal rate and regular rhythm.     Heart sounds: Normal heart sounds.  Pulmonary:     Effort: Pulmonary effort is  normal. No respiratory distress.     Breath sounds: Normal breath sounds. No wheezing or rales.  Chest:     Chest wall: No tenderness.  Abdominal:     General: Bowel sounds are normal.     Palpations: Abdomen is soft.     Tenderness: There is abdominal tenderness (To suprapubic area). There is no guarding or rebound.  Genitourinary:    Comments: Normal-appearing external male genitalia with a circumcised penis.  He has some mild erythema to his scrotal sac and at the base of his penis with satellite lesions which looks consistent with [place] Musculoskeletal: Normal range of motion.  Lymphadenopathy:     Cervical: No cervical adenopathy.  Skin:    General: Skin is warm and dry.     Findings: No rash.  Neurological:     Mental Status: He is alert and oriented to person, place, and time.      ED Treatments / Results  Labs (all labs ordered are listed, but only abnormal results are displayed) Labs Reviewed  URINALYSIS, ROUTINE W REFLEX MICROSCOPIC - Abnormal; Notable for the following components:      Result Value   APPearance HAZY (*)    Hgb urine dipstick LARGE (*)    Leukocytes, UA LARGE (*)    RBC / HPF >50 (*)    WBC, UA >50 (*)     Bacteria, UA RARE (*)    All other components within normal limits  I-STAT CHEM 8, ED - Abnormal; Notable for the following components:   Potassium 3.2 (*)    BUN 25 (*)    Creatinine, Ser 1.40 (*)    Hemoglobin 10.5 (*)    HCT 31.0 (*)    All other components within normal limits    EKG None  Radiology No results found.  Procedures Procedures (including critical care time)  Medications Ordered in ED Medications - No data to display   Initial Impression / Assessment and Plan / ED Course  I have reviewed the triage vital signs and the nursing notes.  Pertinent labs & imaging results that were available during my care of the patient were reviewed by me and considered in my medical decision making (see chart for details).     Patient is a 84 year old male who presents with urinary retention.  He feels much better after his Foley catheter was placed.  With straining nonbloody, yellow urine.  His urine culture from yesterday did grow out Klebsiella which is sensitive to cephalosporins.  He will continue the Keflex that was started yesterday.  I encouraged him to follow-up with his urologist, return precautions given.  Final Clinical Impressions(s) / ED Diagnoses   Final diagnoses:  Urinary retention    ED Discharge Orders    None       Malvin Johns, MD 10/12/18 1906

## 2018-10-15 ENCOUNTER — Emergency Department (HOSPITAL_COMMUNITY)
Admission: EM | Admit: 2018-10-15 | Discharge: 2018-10-15 | Discharge status: Absent without leave | Payer: Medicare Other

## 2018-10-25 ENCOUNTER — Emergency Department (HOSPITAL_COMMUNITY)
Admission: EM | Admit: 2018-10-25 | Discharge: 2018-10-25 | Discharge status: Absent without leave | Payer: Medicare Other

## 2018-10-25 ENCOUNTER — Encounter (HOSPITAL_COMMUNITY): Payer: Self-pay | Admitting: Emergency Medicine

## 2018-10-25 NOTE — ED Triage Notes (Deleted)
Per pt, states foley cath is clogged-states she doesn't feel like her bladder is emptying-called [facility] and they told her to come to ED-states not sure when foley has been replaced-has surgery back in [place] in ED for the same symptoms on 12/21

## 2018-10-28 ENCOUNTER — Telehealth: Payer: Self-pay | Admitting: *Deleted

## 2018-10-28 NOTE — Telephone Encounter (Signed)
[id number] with patient concerning recent urinary retention, now has foley removed and is voiding without trouble.  Is taking medications as prescribed and limiting active to light lifting and short walks.  Rhonda Davis,BSN,RN3,CCM,CN.

## 2018-10-30 DIAGNOSIS — X32XXXD Exposure to sunlight, subsequent encounter: Secondary | ICD-10-CM | POA: Diagnosis not present

## 2018-10-30 DIAGNOSIS — D0439 Carcinoma in situ of skin of other parts of face: Secondary | ICD-10-CM | POA: Diagnosis not present

## 2018-10-30 DIAGNOSIS — D225 Melanocytic nevi of trunk: Secondary | ICD-10-CM | POA: Diagnosis not present

## 2018-10-30 DIAGNOSIS — D044 Carcinoma in situ of skin of scalp and neck: Secondary | ICD-10-CM | POA: Diagnosis not present

## 2018-10-30 DIAGNOSIS — L570 Actinic keratosis: Secondary | ICD-10-CM | POA: Diagnosis not present

## 2018-10-30 DIAGNOSIS — L718 Other rosacea: Secondary | ICD-10-CM | POA: Diagnosis not present

## 2018-11-07 ENCOUNTER — Encounter (HOSPITAL_COMMUNITY): Payer: Self-pay

## 2018-11-07 ENCOUNTER — Other Ambulatory Visit: Payer: Self-pay

## 2018-11-07 ENCOUNTER — Emergency Department (HOSPITAL_COMMUNITY)
Admission: EM | Admit: 2018-11-07 | Discharge: 2018-11-07 | Disposition: A | Discharge status: Home / Self Care | Payer: Medicare Other | Attending: Emergency Medicine | Admitting: Emergency Medicine

## 2018-11-07 DIAGNOSIS — Z8546 Personal history of malignant neoplasm of prostate: Secondary | ICD-10-CM | POA: Diagnosis not present

## 2018-11-07 DIAGNOSIS — I10 Essential (primary) hypertension: Secondary | ICD-10-CM | POA: Insufficient documentation

## 2018-11-07 DIAGNOSIS — Z7982 Long term (current) use of aspirin: Secondary | ICD-10-CM | POA: Diagnosis not present

## 2018-11-07 DIAGNOSIS — R339 Retention of urine, unspecified: Secondary | ICD-10-CM | POA: Insufficient documentation

## 2018-11-07 DIAGNOSIS — Z960 Presence of urogenital implants: Secondary | ICD-10-CM | POA: Diagnosis not present

## 2018-11-07 DIAGNOSIS — Z7689 Persons encountering health services in other specified circumstances: Secondary | ICD-10-CM | POA: Diagnosis not present

## 2018-11-07 DIAGNOSIS — Z87891 Personal history of nicotine dependence: Secondary | ICD-10-CM | POA: Diagnosis not present

## 2018-11-07 DIAGNOSIS — Z79899 Other long term (current) drug therapy: Secondary | ICD-10-CM | POA: Diagnosis not present

## 2018-11-07 DIAGNOSIS — Z85038 Personal history of other malignant neoplasm of large intestine: Secondary | ICD-10-CM | POA: Diagnosis not present

## 2018-11-07 DIAGNOSIS — Z466 Encounter for fitting and adjustment of urinary device: Secondary | ICD-10-CM | POA: Diagnosis not present

## 2018-11-07 NOTE — ED Triage Notes (Signed)
Patient wants the tape changed that is holding his catheter. Patient states that he comes in all the time to have the tape changed.

## 2018-11-07 NOTE — Discharge Instructions (Signed)
Please follow-up with your urologist as scheduled.    Return to the emergency department for new or worsening symptoms in the meantime.

## 2018-11-07 NOTE — ED Provider Notes (Signed)
[facility] DEPT Provider Note   CSN: [id number] Arrival date & time: 11/07/18  1143     History   Chief Complaint Chief Complaint  Patient presents with  . cath issues    HPI [name] is a 85 y.o. male.  HPI   Patient is an 85 year old male with a history of adenocarcinoma of colon, hemorrhoids, hypertension, incontinence, prostate carcinoma, who presents the emergency department today for evaluation of Foley catheter problem.  Patient has had issues with urinary retention and is currently following with urology.  He has had a Foley catheter in place for some time.  He presents today stating  that the tape on his leg to hold his Foley catheter in place is worn and is not sticking to his leg anymore.  He is requesting that we change this.  He denies any other issues or complaints at this time.  States he has follow-up with urology scheduled for surgery 11/28/2017.  No fevers chills or urinary symptoms.  Past Medical History:  Diagnosis Date  . Adenocarcinoma of colon ([place])   . Arthritis   . Diverticulitis   . Gout    no recent flare  . Hemorrhoids   . History of blood transfusion   . Hypertension   . Incontinence   . Peripheral vascular disease ([place]) 2011   DVT right leg  . Prostate carcinoma ([place])   . Wears dentures    upper   . Wears glasses     Patient Active Problem List   Diagnosis Date Noted  .  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 Colon cancer ([place]) 12/15/2013  . Acute lower GI bleeding 11/14/2013  . History of pulmonary embolism 11/14/2013  . LOCALIZED SUPERFICIAL SWELLING MASS OR LUMP 09/30/2010  . DIVERTICULAR DISEASE 06/21/2009  . ACNE ROSACEA 05/12/2009  . ARTHRALGIA 05/12/2009  . Gout, unspecified 05/11/2008  . HYPERTENSION 05/11/2008  . KIDNEY DISEASE, CHRONIC NOS 05/11/2008  . COUGH 04/17/2007  . ESOPHAGEAL STRICTURE 01/23/2007  . ADENOCARCINOMA, PROSTATE, HX OF 01/23/2007     Past Surgical History:  Procedure Laterality Date  . COLON SURGERY    . COLONOSCOPY N/A 11/17/2013   Procedure: COLONOSCOPY;  Surgeon: Jeryl Columbia, MD;  [facility];  Service: Endoscopy;  Laterality: N/A;  . CYSTOSCOPY W/ RETROGRADES Bilateral 08/16/2018   Procedure: CYSTOSCOPY WITH BILATERAL  RETROGRADE PYELOGRAM;  Surgeon: Lucas Mallow, MD;  [facility];  Service: Urology;  Laterality: Bilateral;  . diverticulitis    . EYE SURGERY Bilateral    cataract extraction with IOL  . HERNIA REPAIR  1999  . HOT HEMOSTASIS N/A 11/17/2013   Procedure: HOT HEMOSTASIS (ARGON PLASMA COAGULATION/BICAP);  Surgeon: Jeryl Columbia, MD;  Location: [facility];  Service: Endoscopy;  Laterality: N/A;  . ILEOSTOMY    . LAPAROSCOPIC LYSIS OF ADHESIONS N/A 01/06/2014   Procedure: LAPAROSCOPIC LYSIS OF ADHESIONS;  Surgeon: Odis Hollingshead, MD;  [facility];  Service: General;  Laterality: N/A;  . LAPAROSCOPIC PARTIAL COLECTOMY N/A 01/06/2014   Procedure: LAPAROSCOPIC ASSISTED PARTIAL COLECTOMY;  Surgeon: Odis Hollingshead, MD;  Location[facility];  Service: General;  Laterality: N/A;  . [place]  . TONSILLECTOMY    . TRANSURETHRAL RESECTION OF BLADDER TUMOR N/A 08/16/2018   Procedure: TRANSURETHRAL RESECTION OF BLADDER TUMOR (TURBT);  Surgeon: Lucas Mallow, MD;  [facility];  Service: Urology;  Laterality: N/A;  Home Medications    Prior to Admission medications   Medication Sig Start Date End Date Taking? Authorizing Provider  acetaminophen (TYLENOL) 500 MG tablet Take 1,000 mg by mouth every 8 (eight) hours as needed for mild pain.    [provider]  allopurinol (ZYLOPRIM) 300 MG tablet Take 300 mg by mouth every evening.     [provider]  aspirin EC 81 MG tablet Take 81 mg by mouth daily.    [provider]  carvedilol (COREG) 6.25 MG tablet Take 6.25 mg by mouth 2 (two) times daily with a meal.      [provider]  cephALEXin (KEFLEX) 500 MG capsule Take 1 capsule (500 mg total) by mouth 3 (three) times daily. 10/10/18   Rolland Porter, MD  clotrimazole-betamethasone (LOTRISONE) cream Apply 1 application topically 2 (two) times daily.  08/13/18   [provider]  hydrochlorothiazide (HYDRODIURIL) 50 MG tablet Take 50 mg by mouth daily.     [provider]  HYDROcodone-acetaminophen (NORCO/VICODIN) 5-325 MG tablet Take 1 tablet by mouth every 4 (four) hours as needed for moderate pain. Patient not taking: Reported on 09/18/2018 08/16/18 08/16/19  Marton Redwood III, MD  loperamide (IMODIUM) 2 MG capsule Take 2 mg by mouth as needed for diarrhea or loose stools.    [provider]  Multiple Vitamin (MULTIVITAMIN WITH MINERALS) TABS tablet Take 1 tablet by mouth daily.    [provider]  potassium chloride SA (K-DUR,KLOR-CON) 20 MEQ tablet Take 20 mEq by mouth every evening.  07/13/18   [provider]    Family History History reviewed. No pertinent family history.  Social History Social History   Tobacco Use  . Smoking status: Former Smoker    Types: Cigarettes, Pipe    Last attempt to quit: 01/02/1980    Years since quitting: 38.8  . Smokeless tobacco: Never Used  Substance Use Topics  . Alcohol use: No  . Drug use: No     Allergies   Sulfa antibiotics and Sulfonamide derivatives   Review of Systems Review of Systems  Constitutional: Negative for fever.  Genitourinary: Negative for dysuria.       Catheter problem     Physical Exam Updated Vital Signs BP 138/85 (BP Location: Right Arm)   Pulse 77   Temp 97.7 F (36.5 C) (Oral)   Resp 14   Ht 5\' 10"  (1.778 m)   Wt 77.1 kg   SpO2 100%   BMI 24.39 kg/m   Physical Exam Constitutional:      General: He is not in acute distress.    Appearance: He is well-developed.  Eyes:     Conjunctiva/sclera: Conjunctivae normal.  Cardiovascular:     Rate and Rhythm: Normal rate.    Pulmonary:     Effort: Pulmonary effort is normal.  Skin:    General: Skin is warm and dry.     Comments: Foley catheter in place, StatLock has been changed and is in good position.  Urine and urine bag is clear.  No gross hematuria noted.  Neurological:     Mental Status: He is alert and oriented to person, place, and time.      ED Treatments / Results  Labs (all labs ordered are listed, but only abnormal results are displayed) Labs Reviewed - No data to display  EKG None  Radiology No results found.  Procedures Procedures (including critical care time)  Medications Ordered in ED Medications - No data to  display   Initial Impression / Assessment and Plan / ED Course  I have reviewed the triage vital signs and the nursing notes.  Pertinent labs & imaging results that were available during my care of the patient were reviewed by me and considered in my medical decision making (see chart for details).     Final Clinical Impressions(s) / ED Diagnoses   Final diagnoses:  Encounter for assessment of Foley catheter   Patient is an 85 year old male with a history of adenocarcinoma of colon, hemorrhoids, hypertension, incontinence, prostate carcinoma, who presents the emergency department today for evaluation of Foley catheter problem.  Patient has had issues with urinary retention and is currently following with urology.  He has had a Foley catheter in place for some time.  He presents today stating  that the tape on his leg to hold his Foley catheter in place is worn and is not sticking to his leg anymore.  He is requesting that we change this.  He denies any other issues or complaints at this time.  States he has follow-up with urology scheduled for surgery 11/28/2017.  No fevers chills or urinary symptoms.  On exam, foley catheter in place, StatLock has been changed by nursing staff and is in good position.  Urine and urine bag is clear.  No gross hematuria noted.  Patient  without other complaints.  Vital stable at this time and he is in no acute distress.  Appropriate for outpatient follow-up with urology as already scheduled.  Return precautions discussed.  Patient voiced understanding plan reasons return.  All questions answered.  ED Discharge Orders    None       Bishop Dublin 11/07/18 1245    Fredia Sorrow, MD 11/12/18 [phone]

## 2018-11-12 DIAGNOSIS — R339 Retention of urine, unspecified: Secondary | ICD-10-CM | POA: Diagnosis not present

## 2018-11-25 ENCOUNTER — Encounter (HOSPITAL_COMMUNITY): Payer: Self-pay | Admitting: Urology

## 2018-11-28 DIAGNOSIS — C61 Malignant neoplasm of prostate: Secondary | ICD-10-CM | POA: Diagnosis not present

## 2018-11-28 DIAGNOSIS — D414 Neoplasm of uncertain behavior of bladder: Secondary | ICD-10-CM | POA: Diagnosis not present

## 2018-11-29 ENCOUNTER — Other Ambulatory Visit: Payer: Self-pay

## 2018-11-29 ENCOUNTER — Encounter (HOSPITAL_COMMUNITY): Payer: Self-pay

## 2018-11-29 ENCOUNTER — Emergency Department (HOSPITAL_COMMUNITY)
Admission: EM | Admit: 2018-11-29 | Discharge: 2018-11-30 | Disposition: A | Discharge status: Home / Self Care | Payer: Medicare Other | Attending: Emergency Medicine | Admitting: Emergency Medicine

## 2018-11-29 DIAGNOSIS — C180 Malignant neoplasm of cecum: Secondary | ICD-10-CM | POA: Insufficient documentation

## 2018-11-29 DIAGNOSIS — D010 Carcinoma in situ of colon: Secondary | ICD-10-CM | POA: Insufficient documentation

## 2018-11-29 DIAGNOSIS — Z87891 Personal history of nicotine dependence: Secondary | ICD-10-CM | POA: Diagnosis not present

## 2018-11-29 DIAGNOSIS — R338 Other retention of urine: Secondary | ICD-10-CM | POA: Diagnosis not present

## 2018-11-29 DIAGNOSIS — Z7982 Long term (current) use of aspirin: Secondary | ICD-10-CM | POA: Diagnosis not present

## 2018-11-29 DIAGNOSIS — R319 Hematuria, unspecified: Secondary | ICD-10-CM | POA: Diagnosis not present

## 2018-11-29 DIAGNOSIS — Z960 Presence of urogenital implants: Secondary | ICD-10-CM | POA: Diagnosis not present

## 2018-11-29 DIAGNOSIS — Z978 Presence of other specified devices: Secondary | ICD-10-CM

## 2018-11-29 DIAGNOSIS — Z79899 Other long term (current) drug therapy: Secondary | ICD-10-CM | POA: Insufficient documentation

## 2018-11-29 DIAGNOSIS — I10 Essential (primary) hypertension: Secondary | ICD-10-CM | POA: Insufficient documentation

## 2018-11-29 NOTE — ED Notes (Signed)
Patient has clear, red urine in his leg bag. Patient started cipro yesterday and had his catheter replaced yesterday as well, per patient. He has had one home dose of cipro and was due to take the other one tonight at 9 pm.

## 2018-11-29 NOTE — ED Notes (Signed)
Scanned bladder: 60 mL.

## 2018-11-29 NOTE — ED Notes (Signed)
Foley cath irrigated with 100 mL sterile water, clear urine with small clots returned. Before irrigating, 600 mL light red urine drained from bag.

## 2018-11-29 NOTE — ED Provider Notes (Signed)
[facility] DEPT Provider Note   CSN: [id number] Arrival date & time: 11/29/18  2127     History   Chief Complaint Chief Complaint  Patient presents with  . Hematuria    HPI [name] is a 85 y.o. male.  HPI   He presents for evaluation of discolored urine in his Foley catheter bag, and is worried that it is blood.  Today he was at the urology office because he was unable to void, and apparently had several catheters placed before he was finally discharged from the urology office.  He is concerned that the urine now appears bloody and he has "filled 4 bags," indicating the leg bag he is wearing.  He denies abdominal pain, back pain, weakness, dizziness, nausea, vomiting, fever or chills.  He was started on Cipro, yesterday after he was evaluated, and had a Foley catheter removed.  He was able to void spontaneously yesterday, but today could not, so he was ultimately discharged with a catheter in his bladder.  There are no other known modifying factors.  Past Medical History:  Diagnosis Date  . Adenocarcinoma of colon ([place])   . Arthritis   . Diverticulitis   . Gout    no recent flare  . Hemorrhoids   . History of blood transfusion   . Hypertension   . Incontinence   . Peripheral vascular disease ([place]) 2011   DVT right leg  . Prostate carcinoma ([place])   . Wears dentures    upper   . Wears glasses     Patient Active Problem List   Diagnosis Date Noted  .  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 Colon cancer ([place]) 12/15/2013  . Acute lower GI bleeding 11/14/2013  . History of pulmonary embolism 11/14/2013  . LOCALIZED SUPERFICIAL SWELLING MASS OR LUMP 09/30/2010  . DIVERTICULAR DISEASE 06/21/2009  . ACNE ROSACEA 05/12/2009  . ARTHRALGIA 05/12/2009  . Gout, unspecified 05/11/2008  . HYPERTENSION 05/11/2008  . KIDNEY DISEASE, CHRONIC NOS 05/11/2008  . COUGH 04/17/2007  . ESOPHAGEAL STRICTURE 01/23/2007    . ADENOCARCINOMA, PROSTATE, HX OF 01/23/2007    Past Surgical History:  Procedure Laterality Date  . COLON SURGERY    . COLONOSCOPY N/A 11/17/2013   Procedure: COLONOSCOPY;  Surgeon: Jeryl Columbia, MD;  [facility]: [facility];  Service: Endoscopy;  Laterality: N/A;  . CYSTOSCOPY W/ RETROGRADES Bilateral 08/16/2018   Procedure: CYSTOSCOPY WITH BILATERAL  RETROGRADE PYELOGRAM;  Surgeon: Lucas Mallow, MD;  Location: [facility];  Service: Urology;  Laterality: Bilateral;  . diverticulitis    . EYE SURGERY Bilateral    cataract extraction with IOL  . HERNIA REPAIR  1999  . HOT HEMOSTASIS N/A 11/17/2013   Procedure: HOT HEMOSTASIS (ARGON PLASMA COAGULATION/BICAP);  Surgeon: Jeryl Columbia, MD;  Location: [facility];  Service: Endoscopy;  Laterality: N/A;  . ILEOSTOMY    . LAPAROSCOPIC LYSIS OF ADHESIONS N/A 01/06/2014   Procedure: LAPAROSCOPIC LYSIS OF ADHESIONS;  Surgeon: Odis Hollingshead, MD;  Location: [facility];  Service: General;  Laterality: N/A;  . LAPAROSCOPIC PARTIAL COLECTOMY N/A 01/06/2014   Procedure: LAPAROSCOPIC ASSISTED PARTIAL COLECTOMY;  Surgeon: Odis Hollingshead, MD;  Location: [facility];  Service: General;  Laterality: N/A;  . [place]  . TONSILLECTOMY    . TRANSURETHRAL RESECTION OF BLADDER TUMOR N/A 08/16/2018   Procedure: TRANSURETHRAL RESECTION OF BLADDER TUMOR (TURBT);  Surgeon: Lucas Mallow, MD;  Location: [facility];  Service: Urology;  Laterality: N/A;        Home Medications    Prior to Admission medications   Medication Sig Start Date End Date Taking? Authorizing Provider  acetaminophen (TYLENOL) 500 MG tablet Take 1,000 mg by mouth every 8 (eight) hours as needed for mild pain.    [provider]  allopurinol (ZYLOPRIM) 300 MG tablet Take 300 mg by mouth every evening.     [provider]  aspirin EC 81 MG tablet Take 81 mg by mouth daily.    [provider]  carvedilol (COREG) 6.25 MG tablet Take 6.25 mg by mouth 2  (two) times daily with a meal.      [provider]  cephALEXin (KEFLEX) 500 MG capsule Take 1 capsule (500 mg total) by mouth 3 (three) times daily. 10/10/18   Rolland Porter, MD  clotrimazole-betamethasone (LOTRISONE) cream Apply 1 application topically 2 (two) times daily.  08/13/18   [provider]  hydrochlorothiazide (HYDRODIURIL) 50 MG tablet Take 50 mg by mouth daily.     [provider]  HYDROcodone-acetaminophen (NORCO/VICODIN) 5-325 MG tablet Take 1 tablet by mouth every 4 (four) hours as needed for moderate pain. Patient not taking: Reported on 09/18/2018 08/16/18 08/16/19  Marton Redwood III, MD  loperamide (IMODIUM) 2 MG capsule Take 2 mg by mouth as needed for diarrhea or loose stools.    [provider]  Multiple Vitamin (MULTIVITAMIN WITH MINERALS) TABS tablet Take 1 tablet by mouth daily.    [provider]  potassium chloride SA (K-DUR,KLOR-CON) 20 MEQ tablet Take 20 mEq by mouth every evening.  07/13/18   [provider]    Family History History reviewed. No pertinent family history.  Social History Social History   Tobacco Use  . Smoking status: Former Smoker    Types: Cigarettes, Pipe    Last attempt to quit: 01/02/1980    Years since quitting: 38.9  . Smokeless tobacco: Never Used  Substance Use Topics  . Alcohol use: No  . Drug use: No     Allergies   Sulfa antibiotics and Sulfonamide derivatives   Review of Systems Review of Systems  All other systems reviewed and are negative.    Physical Exam Updated Vital Signs BP (!) 137/99   Pulse 87   Temp 97.9 F (36.6 C) (Oral)   Resp 15   Ht 5\' 10"  (4.854 m)   Wt 79.4 kg   SpO2 100%   BMI 25.11 kg/m   Physical Exam Vitals signs and nursing note reviewed.  Constitutional:      General: He is not in acute distress.    Appearance: Normal appearance. He is well-developed and normal weight. He is not ill-appearing or diaphoretic.  HENT:     Head:  Normocephalic and atraumatic.     Right Ear: External ear normal.     Left Ear: External ear normal.  Eyes:     Conjunctiva/sclera: Conjunctivae normal.     Pupils: Pupils are equal, round, and reactive to light.  Neck:     Musculoskeletal: Normal range of motion and neck supple.     Trachea: Phonation normal.  Cardiovascular:     Rate and Rhythm: Normal rate.  Pulmonary:     Effort: Pulmonary effort is normal.  Abdominal:     General: There is no distension.     Palpations: Abdomen is soft. There is no mass.     Tenderness: There is no abdominal tenderness. There  is no guarding.  Genitourinary:    Comments: Normal-appearing penis and scrotum and scrotal contents.  Foley catheter in urethral meatus, site and appliance appear normal.  Catheter bag has red-tinged urine in it. Musculoskeletal: Normal range of motion.  Skin:    General: Skin is warm and dry.  Neurological:     Mental Status: He is alert and oriented to person, place, and time.     Cranial Nerves: No cranial nerve deficit.     Sensory: No sensory deficit.     Motor: No abnormal muscle tone.     Coordination: Coordination normal.  Psychiatric:        Behavior: Behavior normal.        Thought Content: Thought content normal.        Judgment: Judgment normal.      ED Treatments / Results  Labs (all labs ordered are listed, but only abnormal results are displayed) Labs Reviewed  URINALYSIS, ROUTINE W REFLEX MICROSCOPIC    EKG None  Radiology No results found.  Procedures Procedures (including critical care time)  Medications Ordered in ED Medications - No data to display   Initial Impression / Assessment and Plan / ED Course  I have reviewed the triage vital signs and the nursing notes.  Pertinent labs & imaging results that were available during my care of the patient were reviewed by me and considered in my medical decision making (see chart for details).  Clinical Course as of Nov 29 2314  Fri  Nov 29, 2018  2316 Bladder scan normal, 60 mL   [EW]    Clinical Course User Index [EW] Daleen Bo, MD     Patient Vitals for the past 24 hrs:  BP Temp Temp src Pulse Resp SpO2 Height Weight  11/29/18 2237 (!) 137/99 - - 87 15 100 % - -  11/29/18 2156 (!) 141/98 97.9 F (36.6 C) Oral 91 14 100 % 5\' 10"  (1.778 m) 79.4 kg      Medical Decision Making: Bloody urine after catheter placement today likely traumatic.  Patient is taking Cipro currently to treat possible urinary tract infection, prescribed by urology, yesterday.  Doubt serious bacterial infection or metabolic instability.   CRITICAL CARE-no Performed by: Daleen Bo   Nursing Notes Reviewed/ Care Coordinated Applicable Imaging Reviewed Interpretation of Laboratory Data incorporated into ED treatment  Plan-disposition home after return of urinalysis, by oncoming provider team.   Final Clinical Impressions(s) / ED Diagnoses   Final diagnoses:  Hematuria, unspecified type  Foley catheter in place    ED Discharge Orders    None       Daleen Bo, MD 11/29/18 2329

## 2018-11-29 NOTE — Discharge Instructions (Addendum)
Follow-up with your urologist, as needed for problems.

## 2018-11-29 NOTE — ED Notes (Signed)
NT reported 60 mL of urine in bladder, leg bag emptied, 700 mL of urine obtained.

## 2018-11-29 NOTE — ED Triage Notes (Signed)
Pt reports that he has filled 2246mL worth of bloody urine in his catheter bag. He had his catheter replaced earlier today. He feels that the urine should be getting pinker. A&Ox4. Ambulatory and tolerating PO well. No pain.

## 2018-11-30 ENCOUNTER — Emergency Department (HOSPITAL_COMMUNITY)
Admission: EM | Admit: 2018-11-30 | Discharge: 2018-11-30 | Disposition: A | Discharge status: Home / Self Care | Payer: Medicare Other | Source: Home / Self Care

## 2018-11-30 LAB — URINALYSIS, ROUTINE W REFLEX MICROSCOPIC
Bilirubin Urine: NEGATIVE
Glucose, UA: NEGATIVE mg/dL
Ketones, ur: NEGATIVE mg/dL
Nitrite: NEGATIVE
PH: 7 (ref 5.0–8.0)
Protein, ur: 100 mg/dL — AB
SPECIFIC GRAVITY, URINE: 1.003 — AB (ref 1.005–1.030)

## 2018-12-23 DIAGNOSIS — R338 Other retention of urine: Secondary | ICD-10-CM | POA: Diagnosis not present

## 2019-01-01 DIAGNOSIS — N481 Balanitis: Secondary | ICD-10-CM | POA: Diagnosis not present

## 2019-01-01 DIAGNOSIS — R338 Other retention of urine: Secondary | ICD-10-CM | POA: Diagnosis not present

## 2019-01-07 DIAGNOSIS — I10 Essential (primary) hypertension: Secondary | ICD-10-CM | POA: Diagnosis not present

## 2019-01-07 DIAGNOSIS — Z125 Encounter for screening for malignant neoplasm of prostate: Secondary | ICD-10-CM | POA: Diagnosis not present

## 2019-01-07 DIAGNOSIS — M109 Gout, unspecified: Secondary | ICD-10-CM | POA: Diagnosis not present

## 2019-01-07 DIAGNOSIS — N183 Chronic kidney disease, stage 3 (moderate): Secondary | ICD-10-CM | POA: Diagnosis not present

## 2019-01-14 DIAGNOSIS — C189 Malignant neoplasm of colon, unspecified: Secondary | ICD-10-CM | POA: Diagnosis not present

## 2019-01-14 DIAGNOSIS — Z8546 Personal history of malignant neoplasm of prostate: Secondary | ICD-10-CM | POA: Diagnosis not present

## 2019-01-14 DIAGNOSIS — M109 Gout, unspecified: Secondary | ICD-10-CM | POA: Diagnosis not present

## 2019-01-14 DIAGNOSIS — M1990 Unspecified osteoarthritis, unspecified site: Secondary | ICD-10-CM | POA: Diagnosis not present

## 2019-01-14 DIAGNOSIS — R808 Other proteinuria: Secondary | ICD-10-CM | POA: Diagnosis not present

## 2019-01-14 DIAGNOSIS — R339 Retention of urine, unspecified: Secondary | ICD-10-CM | POA: Diagnosis not present

## 2019-01-14 DIAGNOSIS — Z6825 Body mass index (BMI) 25.0-25.9, adult: Secondary | ICD-10-CM | POA: Diagnosis not present

## 2019-01-14 DIAGNOSIS — Z0000 Encounter for general adult medical examination without abnormal findings: Secondary | ICD-10-CM | POA: Diagnosis not present

## 2019-01-14 DIAGNOSIS — I1310 Hypertensive heart and chronic kidney disease without heart failure, with stage 1 through stage 4 chronic kidney disease, or unspecified chronic kidney disease: Secondary | ICD-10-CM | POA: Diagnosis not present

## 2019-01-14 DIAGNOSIS — N183 Chronic kidney disease, stage 3 (moderate): Secondary | ICD-10-CM | POA: Diagnosis not present

## 2019-01-14 DIAGNOSIS — Z23 Encounter for immunization: Secondary | ICD-10-CM | POA: Diagnosis not present

## 2019-01-14 DIAGNOSIS — Z1331 Encounter for screening for depression: Secondary | ICD-10-CM | POA: Diagnosis not present

## 2019-01-23 DIAGNOSIS — C678 Malignant neoplasm of overlapping sites of bladder: Secondary | ICD-10-CM | POA: Diagnosis not present

## 2019-01-24 ENCOUNTER — Other Ambulatory Visit: Payer: Self-pay | Admitting: Urology

## 2019-01-27 ENCOUNTER — Encounter (HOSPITAL_COMMUNITY): Payer: Self-pay

## 2019-01-27 ENCOUNTER — Encounter (HOSPITAL_COMMUNITY)
Admission: RE | Admit: 2019-01-27 | Discharge: 2019-01-27 | Disposition: A | Discharge status: Home / Self Care | Payer: Medicare Other | Source: Ambulatory Visit | Attending: Urology | Admitting: Urology

## 2019-01-27 ENCOUNTER — Other Ambulatory Visit: Payer: Self-pay

## 2019-01-27 DIAGNOSIS — R310 Gross hematuria: Secondary | ICD-10-CM | POA: Diagnosis not present

## 2019-01-27 DIAGNOSIS — R338 Other retention of urine: Secondary | ICD-10-CM | POA: Diagnosis not present

## 2019-01-27 DIAGNOSIS — C678 Malignant neoplasm of overlapping sites of bladder: Secondary | ICD-10-CM | POA: Diagnosis not present

## 2019-01-27 DIAGNOSIS — Z01812 Encounter for preprocedural laboratory examination: Secondary | ICD-10-CM | POA: Diagnosis not present

## 2019-01-27 LAB — BASIC METABOLIC PANEL
Anion gap: 8 (ref 5–15)
BUN: 30 mg/dL — ABNORMAL HIGH (ref 8–23)
CO2: 26 mmol/L (ref 22–32)
Calcium: 10.2 mg/dL (ref 8.9–10.3)
Chloride: 104 mmol/L (ref 98–111)
Creatinine, Ser: 1.47 mg/dL — ABNORMAL HIGH (ref 0.61–1.24)
GFR calc Af Amer: 50 mL/min — ABNORMAL LOW (ref >60)
GFR calc non Af Amer: 43 mL/min — ABNORMAL LOW (ref >60)
Glucose, Bld: 101 mg/dL — ABNORMAL HIGH (ref 70–99)
Potassium: 3.8 mmol/L (ref 3.5–5.1)
Sodium: 138 mmol/L (ref 135–145)

## 2019-01-27 LAB — CBC
HCT: 37.5 % — ABNORMAL LOW (ref 39.0–52.0)
Hemoglobin: 12.1 g/dL — ABNORMAL LOW (ref 13.0–17.0)
MCH: 31.4 pg (ref 26.0–34.0)
MCHC: 32.3 g/dL (ref 30.0–36.0)
MCV: 97.4 fL (ref 80.0–100.0)
Platelets: 183 10*3/uL (ref 150–400)
RBC: 3.85 MIL/uL — ABNORMAL LOW (ref 4.22–5.81)
RDW: 14.8 % (ref 11.5–15.5)
WBC: 9 10*3/uL (ref 4.0–10.5)
nRBC: 0 % (ref 0.0–0.2)

## 2019-01-27 NOTE — Patient Instructions (Addendum)
[name]    Your procedure is scheduled on: 02/03/2019   Report to [facility] Main  Entrance  Report to admitting at 10:00 AM    Call this number if you have problems the morning of surgery [phone]    Remember: Do not eat food or drink liquids :After Midnight.  BRUSH YOUR TEETH MORNING OF SURGERY AND RINSE YOUR MOUTH OUT, NO CHEWING GUM CANDY OR MINTS.     Take these medicines the morning of surgery with A SIP OF WATER: Carvedilol( Coreg) DO NOT TAKE ANY DIABETIC MEDICATIONS DAY OF YOUR SURGERY                                You may not have any metal on your body .            Do not wear jewelry, lotions, powders or perfumes, deodorant                          Men may shave face and neck.   Do not bring valuables to the hospital.  [place].  Contacts, dentures or bridgework may not be worn into surgery.       Patients discharged the day of surgery will not be allowed to drive home.  IF YOU ARE HAVING SURGERY AND GOING HOME THE SAME DAY, YOU MUST HAVE AN ADULT TO DRIVE YOU HOME AND BE WITH YOU FOR 24 HOURS.  YOU MAY GO HOME BY TAXI OR UBER OR ORTHERWISE, BUT AN ADULT MUST ACCOMPANY YOU HOME AND STAY WITH YOU FOR 24 HOURS.   Name and phone number of your driver:   Special Instructions: N/A              Please read over the following fact sheets you were given: _____________________________________________________________________  Call Pharmacy to review Meds  [phone]          [facility] - Preparing for Surgery Before surgery, you can play an important role.  Because skin is not sterile, your skin needs to be as free of germs as possible.  You can reduce the number of germs on your skin by washing with CHG (chlorahexidine gluconate) soap before surgery.  CHG is an antiseptic cleaner which kills germs and bonds with the skin to continue killing germs even after washing. Please DO  NOT use if you have an allergy to CHG or antibacterial soaps.  If your skin becomes reddened/irritated stop using the CHG and inform your nurse when you arrive at Short Stay. Do not shave (including legs and underarms) for at least 48 hours prior to the first CHG shower.  You may shave your face/neck. Please follow these instructions carefully:  1.  Shower with CHG Soap the night before surgery and the  morning of Surgery.  2.  If you choose to wash your hair, wash your hair first as usual with your  normal  shampoo.  3.  After you shampoo, rinse your hair and body thoroughly to remove the  shampoo.                           4.  Use CHG  as you would any other liquid soap.  You can apply chg directly  to the skin and wash                       Gently with a scrungie or clean washcloth.  5.  Apply the CHG Soap to your body ONLY FROM THE NECK DOWN.   Do not use on face/ open                           Wound or open sores. Avoid contact with eyes, ears mouth and genitals (private parts).                       Wash face,  Genitals (private parts) with your normal soap.             6.  Wash thoroughly, paying special attention to the area where your surgery  will be performed.  7.  Thoroughly rinse your body with warm water from the neck down.  8.  DO NOT shower/wash with your normal soap after using and rinsing off  the CHG Soap.                9.  Pat yourself dry with a clean towel.            10.  Wear clean pajamas.            11.  Place clean sheets on your bed the night of your first shower and do not  sleep with pets. Day of Surgery : Do not apply any lotions/deodorants the morning of surgery.  Please wear clean clothes to the hospital/surgery center.  FAILURE TO FOLLOW THESE INSTRUCTIONS MAY RESULT IN THE CANCELLATION OF YOUR SURGERY PATIENT SIGNATURE_________________________________  NURSE  SIGNATURE__________________________________  ________________________________________________________________________

## 2019-01-27 NOTE — Progress Notes (Signed)
IOX:[id number] Tisovec  CARDIOLOGIST:none  INFO IN Epic:CBC  BUN 25  INFO ON CHART:  BLOOD THINNERS AND LAST DOSES: ____________________________________  PATIENT SYMPTOMS AT TIME OF PREOP: none     For jessica to review

## 2019-01-28 NOTE — Anesthesia Preprocedure Evaluation (Addendum)
Anesthesia Evaluation  Patient identified by MRN, date of birth, ID band Patient awake    Reviewed: Allergy & Precautions, NPO status , Patient's Chart, lab work & pertinent test results  Airway Mallampati: I  TM Distance: >3 FB Neck ROM: Full    Dental  (+) Upper Dentures, Dental Advisory Given   Pulmonary former smoker,    breath sounds clear to auscultation       Cardiovascular hypertension, Pt. on home beta blockers and Pt. on medications + Peripheral Vascular Disease   Rhythm:Regular Rate:Normal     Neuro/Psych negative neurological ROS     GI/Hepatic negative GI ROS, Neg liver ROS,   Endo/Other  negative endocrine ROS  Renal/GU Renal InsufficiencyRenal disease     Musculoskeletal  (+) Arthritis ,   Abdominal Normal abdominal exam  (+)   Peds  Hematology negative hematology ROS (+)   Anesthesia Other Findings   Reproductive/Obstetrics                           Lab Results  Component Value Date   WBC 9.0 01/27/2019   HGB 12.1 (L) 01/27/2019   HCT 37.5 (L) 01/27/2019   MCV 97.4 01/27/2019   PLT 183 01/27/2019   Lab Results  Component Value Date   CREATININE 1.47 (H) 01/27/2019   BUN 30 (H) 01/27/2019   NA 138 01/27/2019   K 3.8 01/27/2019   CL 104 01/27/2019   CO2 26 01/27/2019   Lab Results  Component Value Date   INR 1.06 09/18/2018   INR 1.00 01/01/2014   INR 0.94 11/18/2013   EKG: normal sinus rhythm, frequent PVC's noted.   Anesthesia Physical Anesthesia Plan  ASA: III  Anesthesia Plan: General   Post-op Pain Management:    Induction: Intravenous  PONV Risk Score and Plan: 3 and Ondansetron, Dexamethasone and Treatment may vary due to age or medical condition  Airway Management Planned: Oral ETT  Additional Equipment: None  Intra-op Plan:   Post-operative Plan: Extubation in OR  Informed Consent: I have reviewed the patients History and Physical,  chart, labs and discussed the procedure including the risks, benefits and alternatives for the proposed anesthesia with the patient or authorized representative who has indicated his/her understanding and acceptance.     Dental advisory given  Plan Discussed with: CRNA  Anesthesia Plan Comments: (See PAT note 01/27/19, Konrad Felix, PA-C)      Anesthesia Quick Evaluation

## 2019-01-28 NOTE — Progress Notes (Signed)
Anesthesia Chart Review   Case:  [id number] Date/Time:  02/03/19 1145   Procedure:  TRANSURETHRAL RESECTION OF BLADDER TUMOR WITH GEMCITABINE (N/A )   Anesthesia type:  General   Pre-op diagnosis:  BLADDER TUMOR   Location:  [place] 08 / [facility]   Surgeon:  Lucas Mallow, MD      DISCUSSION: 85 yo former smoker (quit 01/02/80) with h/o HTN, prostate cancer, colon adenocarcinoma, PVD, h/o DVT 2011, bladder tumor (catheter in place) scheduled for above procedure 02/03/19 with Dr. Link Snuffer.   Pt has been seen in ED multiple times over the last 4 months due to hematuria and problems with urinary catheter.  TURBT done 08/06/18, anesthesia records reviewed with no anesthesia complications noted.    Pt can proceed with planned procedure barring acute status change.  VS: BP 130/78   Pulse 65   Temp 36.4 C (Oral)   Resp 18   Ht 5\' 10"  (1.778 m)   Wt 78.5 kg   SpO2 100%   BMI 24.82 kg/m   PROVIDERS: Tisovec, Fransico Him, MD is PCP    LABS: Labs reviewed: Acceptable for surgery. (all labs ordered are listed, but only abnormal results are displayed)  Labs Reviewed  BASIC METABOLIC PANEL - Abnormal; Notable for the following components:      Result Value   Glucose, Bld 101 (*)    BUN 30 (*)    Creatinine, Ser 1.47 (*)    GFR calc non Af Amer 43 (*)    GFR calc Af Amer 50 (*)    All other components within normal limits  CBC - Abnormal; Notable for the following components:   RBC 3.85 (*)    Hemoglobin 12.1 (*)    HCT 37.5 (*)    All other components within normal limits     IMAGES:   EKG: 08/16/18 Rate 77 bpm Sinus rhythm with Premature supraventricular complexes Left anterior hemiblock Abnormal ECG   CV:  Past Medical History:  Diagnosis Date  . Adenocarcinoma of colon ([place])   . Arthritis    gout  . Diverticulitis   . Gout    no recent flare  . Hemorrhoids   . History of blood transfusion   . Hypertension   . Incontinence   . Peripheral vascular disease  ([place]) 2011   DVT right leg  . Prostate carcinoma ([place])   . Wears dentures    upper   . Wears glasses     Past Surgical History:  Procedure Laterality Date  . COLON SURGERY    . COLONOSCOPY N/A 11/17/2013   Procedure: COLONOSCOPY;  Surgeon: Jeryl Columbia, MD;  [facility]: [facility];  Service: Endoscopy;  Laterality: N/A;  . CYSTOSCOPY W/ RETROGRADES Bilateral 08/16/2018   Procedure: CYSTOSCOPY WITH BILATERAL  RETROGRADE PYELOGRAM;  Surgeon: Lucas Mallow, MD;  Location: [facility];  Service: Urology;  Laterality: Bilateral;  . diverticulitis    . EYE SURGERY Bilateral    cataract extraction with IOL  . HERNIA REPAIR  1999   inguinal  . HOT HEMOSTASIS N/A 11/17/2013   Procedure: HOT HEMOSTASIS (ARGON PLASMA COAGULATION/BICAP);  Surgeon: Jeryl Columbia, MD;  Location: [facility];  Service: Endoscopy;  Laterality: N/A;  . ILEOSTOMY    . LAPAROSCOPIC LYSIS OF ADHESIONS N/A 01/06/2014   Procedure: LAPAROSCOPIC LYSIS OF ADHESIONS;  Surgeon: Odis Hollingshead, MD;  Location: [facility];  Service: General;  Laterality: N/A;  . LAPAROSCOPIC PARTIAL COLECTOMY N/A 01/06/2014  Procedure: LAPAROSCOPIC ASSISTED PARTIAL COLECTOMY;  Surgeon: Odis Hollingshead, MD;  Location: [facility];  Service: General;  Laterality: N/A;  . [place]  . TONSILLECTOMY    . TRANSURETHRAL RESECTION OF BLADDER TUMOR N/A 08/16/2018   Procedure: TRANSURETHRAL RESECTION OF BLADDER TUMOR (TURBT);  Surgeon: Lucas Mallow, MD;  Location: [facility];  Service: Urology;  Laterality: N/A;    MEDICATIONS: . acetaminophen (TYLENOL) 500 MG tablet  . allopurinol (ZYLOPRIM) 300 MG tablet  . aspirin EC 81 MG tablet  . carvedilol (COREG) 6.25 MG tablet  . cephALEXin (KEFLEX) 500 MG capsule  . clotrimazole-betamethasone (LOTRISONE) cream  . hydrochlorothiazide (HYDRODIURIL) 50 MG tablet  . HYDROcodone-acetaminophen (NORCO/VICODIN) 5-325 MG tablet  . loperamide (IMODIUM) 2 MG capsule  . Multiple Vitamin (MULTIVITAMIN WITH  MINERALS) TABS tablet  . potassium chloride SA (K-DUR,KLOR-CON) 20 MEQ tablet   No current facility-administered medications for this encounter.     Maia Plan [facility] Pre-Surgical Testing [phone] 01/28/19 9:43 AM

## 2019-02-03 ENCOUNTER — Ambulatory Visit (HOSPITAL_COMMUNITY)
Admission: RE | Admit: 2019-02-03 | Discharge: 2019-02-03 | Disposition: A | Discharge status: Home / Self Care | Payer: Medicare Other | Attending: Urology | Admitting: Urology

## 2019-02-03 ENCOUNTER — Ambulatory Visit (HOSPITAL_COMMUNITY): Payer: Medicare Other | Admitting: Physician Assistant

## 2019-02-03 ENCOUNTER — Ambulatory Visit (HOSPITAL_COMMUNITY): Payer: Medicare Other | Admitting: Certified Registered"

## 2019-02-03 ENCOUNTER — Encounter (HOSPITAL_COMMUNITY)
Admission: RE | Disposition: A | Discharge status: Home / Self Care | Payer: Self-pay | Source: Home / Self Care | Attending: Urology

## 2019-02-03 ENCOUNTER — Encounter (HOSPITAL_COMMUNITY): Payer: Self-pay | Admitting: *Deleted

## 2019-02-03 DIAGNOSIS — Z8551 Personal history of malignant neoplasm of bladder: Secondary | ICD-10-CM | POA: Diagnosis not present

## 2019-02-03 DIAGNOSIS — Z8042 Family history of malignant neoplasm of prostate: Secondary | ICD-10-CM | POA: Diagnosis not present

## 2019-02-03 DIAGNOSIS — D494 Neoplasm of unspecified behavior of bladder: Secondary | ICD-10-CM | POA: Insufficient documentation

## 2019-02-03 DIAGNOSIS — I739 Peripheral vascular disease, unspecified: Secondary | ICD-10-CM | POA: Insufficient documentation

## 2019-02-03 DIAGNOSIS — Z87891 Personal history of nicotine dependence: Secondary | ICD-10-CM | POA: Insufficient documentation

## 2019-02-03 DIAGNOSIS — M1990 Unspecified osteoarthritis, unspecified site: Secondary | ICD-10-CM | POA: Insufficient documentation

## 2019-02-03 DIAGNOSIS — I10 Essential (primary) hypertension: Secondary | ICD-10-CM | POA: Insufficient documentation

## 2019-02-03 DIAGNOSIS — N320 Bladder-neck obstruction: Secondary | ICD-10-CM | POA: Insufficient documentation

## 2019-02-03 DIAGNOSIS — Z923 Personal history of irradiation: Secondary | ICD-10-CM | POA: Insufficient documentation

## 2019-02-03 DIAGNOSIS — Z7982 Long term (current) use of aspirin: Secondary | ICD-10-CM | POA: Diagnosis not present

## 2019-02-03 DIAGNOSIS — Z9079 Acquired absence of other genital organ(s): Secondary | ICD-10-CM | POA: Diagnosis not present

## 2019-02-03 DIAGNOSIS — Z8546 Personal history of malignant neoplasm of prostate: Secondary | ICD-10-CM | POA: Insufficient documentation

## 2019-02-03 DIAGNOSIS — Z79899 Other long term (current) drug therapy: Secondary | ICD-10-CM | POA: Diagnosis not present

## 2019-02-03 DIAGNOSIS — Z882 Allergy status to sulfonamides status: Secondary | ICD-10-CM | POA: Insufficient documentation

## 2019-02-03 DIAGNOSIS — N3010 Interstitial cystitis (chronic) without hematuria: Secondary | ICD-10-CM | POA: Diagnosis not present

## 2019-02-03 HISTORY — PX: TRANSURETHRAL RESECTION OF BLADDER TUMOR WITH MITOMYCIN-C: SHX6459

## 2019-02-03 SURGERY — TRANSURETHRAL RESECTION OF BLADDER TUMOR WITH MITOMYCIN-C
Anesthesia: General | Site: Bladder

## 2019-02-03 MED ORDER — LIDOCAINE 2% (20 MG/ML) 5 ML SYRINGE
INTRAMUSCULAR | Status: AC
  Filled 2019-02-03: qty 5

## 2019-02-03 MED ORDER — SUGAMMADEX SODIUM 200 MG/2ML IV SOLN
INTRAVENOUS | Status: DC | PRN
  Administered 2019-02-03: 200 mg via INTRAVENOUS

## 2019-02-03 MED ORDER — DEXAMETHASONE SODIUM PHOSPHATE 10 MG/ML IJ SOLN
INTRAMUSCULAR | Status: AC
  Filled 2019-02-03: qty 1

## 2019-02-03 MED ORDER — SODIUM CHLORIDE 0.9 % IR SOLN
Status: DC | PRN
  Administered 2019-02-03: 1000 mL

## 2019-02-03 MED ORDER — CARVEDILOL 6.25 MG PO TABS
6.2500 mg | ORAL_TABLET | Freq: Two times a day (BID) | ORAL | Status: DC
  Administered 2019-02-03: 6.25 mg via ORAL
  Filled 2019-02-03 (×2): qty 1

## 2019-02-03 MED ORDER — CEFAZOLIN SODIUM-DEXTROSE 2-4 GM/100ML-% IV SOLN
INTRAVENOUS | Status: AC
  Filled 2019-02-03: qty 100

## 2019-02-03 MED ORDER — FENTANYL CITRATE (PF) 100 MCG/2ML IJ SOLN
INTRAMUSCULAR | Status: AC
  Filled 2019-02-03: qty 2

## 2019-02-03 MED ORDER — EPHEDRINE SULFATE-NACL 50-0.9 MG/10ML-% IV SOSY
PREFILLED_SYRINGE | INTRAVENOUS | Status: DC | PRN
  Administered 2019-02-03: 10 mg via INTRAVENOUS

## 2019-02-03 MED ORDER — ROCURONIUM BROMIDE 10 MG/ML (PF) SYRINGE
PREFILLED_SYRINGE | INTRAVENOUS | Status: AC
  Filled 2019-02-03: qty 10

## 2019-02-03 MED ORDER — GEMCITABINE CHEMO FOR BLADDER INSTILLATION 2000 MG
2000.0000 mg | Freq: Once | INTRAVENOUS | Status: AC
  Administered 2019-02-03: 2000 mg via INTRAVESICAL
  Filled 2019-02-03: qty 2000

## 2019-02-03 MED ORDER — LACTATED RINGERS IV SOLN
INTRAVENOUS | Status: DC
  Administered 2019-02-03: 11:00:00 via INTRAVENOUS

## 2019-02-03 MED ORDER — CEFAZOLIN SODIUM-DEXTROSE 2-4 GM/100ML-% IV SOLN
2.0000 g | INTRAVENOUS | Status: AC
  Administered 2019-02-03: 2 g via INTRAVENOUS

## 2019-02-03 MED ORDER — ROCURONIUM BROMIDE 10 MG/ML (PF) SYRINGE
PREFILLED_SYRINGE | INTRAVENOUS | Status: DC | PRN
  Administered 2019-02-03: 30 mg via INTRAVENOUS

## 2019-02-03 MED ORDER — LIDOCAINE 2% (20 MG/ML) 5 ML SYRINGE
INTRAMUSCULAR | Status: DC | PRN
  Administered 2019-02-03: 40 mg via INTRAVENOUS

## 2019-02-03 MED ORDER — PROPOFOL 10 MG/ML IV BOLUS
INTRAVENOUS | Status: AC
  Filled 2019-02-03: qty 20

## 2019-02-03 MED ORDER — ONDANSETRON HCL 4 MG/2ML IJ SOLN
INTRAMUSCULAR | Status: AC
  Filled 2019-02-03: qty 2

## 2019-02-03 MED ORDER — SODIUM CHLORIDE 0.9 % IR SOLN
Status: DC | PRN
  Administered 2019-02-03: 6000 mL

## 2019-02-03 MED ORDER — FENTANYL CITRATE (PF) 100 MCG/2ML IJ SOLN
INTRAMUSCULAR | Status: AC
  Administered 2019-02-03: 25 ug via INTRAVENOUS
  Filled 2019-02-03: qty 2

## 2019-02-03 MED ORDER — FENTANYL CITRATE (PF) 250 MCG/5ML IJ SOLN
INTRAMUSCULAR | Status: DC | PRN
  Administered 2019-02-03: 25 ug via INTRAVENOUS

## 2019-02-03 MED ORDER — SUGAMMADEX SODIUM 500 MG/5ML IV SOLN
INTRAVENOUS | Status: AC
  Filled 2019-02-03: qty 5

## 2019-02-03 MED ORDER — DEXAMETHASONE SODIUM PHOSPHATE 10 MG/ML IJ SOLN
INTRAMUSCULAR | Status: DC | PRN
  Administered 2019-02-03: 6 mg via INTRAVENOUS

## 2019-02-03 MED ORDER — ONDANSETRON HCL 4 MG/2ML IJ SOLN
INTRAMUSCULAR | Status: DC | PRN
  Administered 2019-02-03: 4 mg via INTRAVENOUS

## 2019-02-03 MED ORDER — SUCCINYLCHOLINE CHLORIDE 200 MG/10ML IV SOSY
PREFILLED_SYRINGE | INTRAVENOUS | Status: DC | PRN
  Administered 2019-02-03: 100 mg via INTRAVENOUS

## 2019-02-03 MED ORDER — PROPOFOL 10 MG/ML IV BOLUS
INTRAVENOUS | Status: DC | PRN
  Administered 2019-02-03: 80 mg via INTRAVENOUS

## 2019-02-03 MED ORDER — HYDROCODONE-ACETAMINOPHEN 5-325 MG PO TABS: 1.0000 | ORAL_TABLET | ORAL | 0 refills | Status: AC | PRN

## 2019-02-03 MED ORDER — FENTANYL CITRATE (PF) 100 MCG/2ML IJ SOLN
25.0000 ug | INTRAMUSCULAR | Status: DC | PRN
  Administered 2019-02-03: 14:00:00 25 ug via INTRAVENOUS
  Administered 2019-02-03: 14:00:00 50 ug via INTRAVENOUS

## 2019-02-03 MED ORDER — SUCCINYLCHOLINE CHLORIDE 200 MG/10ML IV SOSY
PREFILLED_SYRINGE | INTRAVENOUS | Status: AC
  Filled 2019-02-03: qty 10

## 2019-02-03 MED ORDER — HYDROCODONE-ACETAMINOPHEN 5-325 MG PO TABS: 1.0000 | ORAL_TABLET | ORAL | 0 refills | Status: DC | PRN

## 2019-02-03 SURGICAL SUPPLY — 17 items
BAG URINE DRAINAGE (UROLOGICAL SUPPLIES) ×3 IMPLANT
BAG URO CATCHER STRL LF (MISCELLANEOUS) ×3 IMPLANT
CATH FOLEY 2WAY SLVR  5CC 20FR (CATHETERS) ×2
CATH FOLEY 2WAY SLVR 5CC 20FR (CATHETERS) ×1 IMPLANT
COVER WAND RF STERILE (DRAPES) IMPLANT
ELECT REM PT RETURN 15FT ADLT (MISCELLANEOUS) ×3 IMPLANT
GLOVE BIO SURGEON STRL SZ7.5 (GLOVE) ×3 IMPLANT
GOWN STRL REUS W/TWL LRG LVL3 (GOWN DISPOSABLE) ×3 IMPLANT
KIT TURNOVER KIT A (KITS) IMPLANT
LOOP CUT BIPOLAR 24F LRG (ELECTROSURGICAL) ×3 IMPLANT
MANIFOLD NEPTUNE II (INSTRUMENTS) ×3 IMPLANT
PACK CYSTO (CUSTOM PROCEDURE TRAY) ×3 IMPLANT
SET ASPIRATION TUBING (TUBING) IMPLANT
SYRINGE IRR TOOMEY STRL 70CC (SYRINGE) ×3 IMPLANT
TUBING CONNECTING 10 (TUBING) ×2 IMPLANT
TUBING CONNECTING 10' (TUBING) ×1
TUBING UROLOGY SET (TUBING) ×3 IMPLANT

## 2019-02-03 NOTE — Anesthesia Procedure Notes (Addendum)
Procedure Name: Intubation Date/Time: 02/03/2019 12:08 PM Performed by: Mitzie Na, CRNA Pre-anesthesia Checklist: Patient identified, Emergency Drugs available, Suction available, Patient being monitored and Timeout performed Patient Re-evaluated:Patient Re-evaluated prior to induction Oxygen Delivery Method: Circle system utilized Preoxygenation: Pre-oxygenation with 100% oxygen Induction Type: IV induction Ventilation: Mask ventilation without difficulty Laryngoscope Size: Mac and 4 Grade View: Grade I Tube type: Oral Tube size: 7.5 mm Number of attempts: 1 Airway Equipment and Method: Stylet Placement Confirmation: ETT inserted through vocal cords under direct vision,  positive ETCO2 and breath sounds checked- equal and bilateral Secured at: 23 cm Tube secured with: Tape Dental Injury: Teeth and Oropharynx as per pre-operative assessment

## 2019-02-03 NOTE — Op Note (Signed)
Operative Note  Preoperative diagnosis:  1.  Bladder tumor--medium history of low-grade bladder cancer  Postoperative diagnosis: 1.  Bladder neck contracture 2.  Bladder tumor--medium with history of low-grade bladder cancer  Procedure(s): 1.  Cystoscopy with bladder neck contracture dilation 2.  Transurethral resection of bladder tumor--small 2.  Intravesical instillation of chemotherapy agent--gemcitabine  Surgeon: Link Snuffer, MD  Assistants: None  Anesthesia: General  Complications: None immediate  EBL: Minimal  Specimens: 1.  Bladder tumor  Drains/Catheters: 1.  20 French urethral catheter  Intraoperative findings: 1.  Normal anterior urethra 2.  Dense scar tissue of the bladder neck consistent with very dense bladder neck contracture that was dilated with the resectoscope. 3.  Trabeculated bladder.  On the left posterior bladder wall was a 1 cm solitary papillary appearing tumor.  There was another 1 cm area of edema versus tumor on the right posterior wall.  Both were completely resected.  Areas of resection were well away from bilateral ureteral orifices.  Indication: 85 year old male with a history of prostate cancer status post prostatectomy with biochemical recurrence status post radiation in 2002.  He recently underwent a TURBT in November of last year that revealed low-grade superficial urothelial cell carcinoma.  He presents for repeat TURBT with instillation of gemcitabine him in the finding of a recurrence.  Description of procedure:  The patient was identified and consent was obtained.  The patient was taken to the operating room and placed in the supine position.  The patient was placed under general anesthesia.  Perioperative antibiotics were administered.  The patient was placed in dorsal lithotomy.  Patient was prepped and draped in a standard sterile fashion and a timeout was performed.  A 26 French resectoscope with a visual obturator in place was  advanced into the urethra.  Bladder neck was quite dense with an approximately 93 French bladder neck contracture.  I was able to dilate this carefully using the resectoscope and dilating it to that size.  The surrounding tissue was quite dense.  I was able to atraumatically insert the scope into the bladder.  I then performed a complete cystoscopy with findings noted above.  On bipolar settings, I resected the masses of interest and collected the bladder specimens.  I fulgurated the resection beds.  There was no active bleeding noted.  I therefore withdrew the scope and placed a 20 French Foley catheter.  This concluded the operation.  The patient tolerated procedure well and was stable postoperative.  In the PACU, I instilled intravesical gemcitabine which remained for approximately 1 hour prior to proper disposal.  Plan: Patient will be discharged with Foley catheter given his history of a contractile bladder.  He was unable to perform CIC.  He would rather just have Foley catheter.

## 2019-02-03 NOTE — Discharge Instructions (Signed)

## 2019-02-03 NOTE — H&P (Signed)
CC/HPI: Cc: Gross hematuria, history of prostate cancer  HPI:  35:  85 YO male patient of Dr. Arlyn Leak seen today for a 6 month office visit.    H/o with (probable) biochemical recurrent prostate cancer. He is status post radical retropubic prostatectomy in 1992, followed by external beam radiation therapy in 2002. However, in 2004, his PSA began to rise from 0.04 to 1.58, to 2.68 in 2009, to 3.04 in 2010. June 2011 PSA 2.74. PSA on 12/09/10 of 2.12.    He has a normal DEXA scan and normal vitamin D level of 38. The patient has had post prostatectomy stress urinary incontinence which was greatly aided by Sudafed which he no longer uses. He also does Kegals. He has cough, laugh, sneeze incontinence, with 2 pad incontinence when he plays golf.    He is s/p cysto/urethral dilation of stricture on 11/02/09 after Dr. Dalbert Batman wasn't able to place foley for surgery of colostomy 11/02/09. Pt has failed Toviaz. Pt had his colostomy reversed on 06/28/10.    11/01/11 PSA - 2.38    7/13: 2.76.    10/04/18 Interval HX:   Today denies hematuria, dysuria, or change in FOS. Still has urinary incontinence and wears 2 pads/day. States he only took Myrbetriq 50 mg 1 po daily X 2 weeks w/o relief of sxs but does state no change in behavior (drinks numerous sodas/day).    No bone pain or weight loss.    08/12/2018:  Patient is status post radical prostatectomy in 1992. He says currently had a biochemical recurrence that resulted in external beam radiation treatment in 2002. His PSA then increased slowly but stabilized. He has not been seen here since 2015. On Friday, the patient went to the emergency department with gross hematuria. He was subsequently discharged as he was stable. Soon thereafter however he developed urinary retention that prompted another emergency room visit. He had a Foley catheter placed and a CT scan was performed without contrast of the abdomen and pelvis. This revealed urinary  retention with bilateral hydronephrosis as a result. There was high-density material in the bladder consistent with hemorrhage. There is no obvious lymphadenopathy or bone lesions. Patient's hematuria has resolved. At baseline, the patient does have stress urinary incontinence and uses about 2 pads per day.   08/23/2018:  Patient has been doing well since TURBT. Pathology revealed low-grade superficial urothelial cell carcinoma. Muscularis was present in the specimen and uninvolved. The patient has been doing well he presents for voiding trial as well. During the surgery, resected the bladder neck to open it up there was no sign of malignancy here.   09/24/2018:  Since the TURBT, the patient has had some intermittent gross hematuria with clot retention. He has passed several voiding trials but then gone back into retention. Each time he goes into retention, he has had gross hematuria with clots. He has not had any gross hematuria from his catheter for at least 2 weeks according to him.   11/12/2018  Patient has had his Foley catheter for about 3 weeks. He does not wish to have it removed today. He states that he is very comfortable. He would prefer to wait until scheduled cystoscopy. He has been using the nystatin powder with good results. No other complaints.   11/28/2018  Patient presents for cystoscopy today. He has a history of low-grade superficial bladder cancer. He states he is getting his PSA checked by his primary care physician.   12/23/18: The patient is here today for  a catheter exchange after supposedly failing multiple voiding trials. He isn't bothered by his catheter and denies interval episodes of hematuria or bladder spasms.   01/01/19: Patient with above noted history. He presents today with concerns regarding an area of erythema with rash on his glans penis. This is noted when he retracts foreskin. He states that he first noted the area about 2-3 days ago. The area is not painful. It does  not itch. He continues to tolerate indwelling catheter well. He states that the catheter has been draining without issues. He denies painful urgency or leakage from around the catheter. He denies gross hematuria, fevers, or chills.   01/23/2019  Patient continues with a Foley catheter. Likely has a areflexic bladder. He also returns for surveillance cystoscopy.     CC: I have urinary retention.  HPI: [name] is a 85 year-old male established patient who is here for urinary retention.  01/27/2019: Taught CIC at last visit on 01/23/2019 to keep bladder residual <419ml. Unfortunately over the weekend he's had issues with performing the procedure at home to effectively empty the bladder. He presents this afternoon to have a foley catheter placed. Pt is scheduled for TURBT with instillation of gemcitabine on 02/03/2019 with Dr Gloriann Loan.   Patient reports grossly unsuccessful with CIC or for the past couple of days. Both He and his wife have tried. He continues to void small amounts on his own but is also leaking quite a bit having to rely on and changes multiple times per day. Denies any gross hematuria or burning/painful urination, denies fever/chills or nausea/vomiting.   He does not currently have an indwelling catheter.   He does have to strain or bear down to start his urinary stream. He does not have a good size and strength to his urinary stream. He is not having problems emptying his bladder. His urine has not shut off completely.   He is having problems with urinary control or incontinence.   He has previously had an indwelling catheter in for more than two weeks at a time.     ALLERGIES: Sulfa Drugs    MEDICATIONS: Allopurinol 100 mg tablet Oral  Aspir 81  Carvedilol 6.25 MG Oral Tablet Oral  Clotrimazole-Betamethasone 1 %-0.05 % cream Apply to affected area BID for 1-2 weeks  Hydrochlorothiazide 50 mg tablet Oral  Loperamide 2 mg tablet tablet PRN  Multivitamins With Minerals   Potassium Chloride 20 MEQ Oral Packet Oral     GU PSH: Cysto Dilate Stricture (M or F) - 2011 Cystoscopy - 01/23/2019, 11/28/2018, 08/12/2018 Cystoscopy Insert Stent - 2011 Cystoscopy TURBT <2 cm - 08/16/2018 Revise Bladder Neck - 08/16/2018      PSH Notes: Ileostomy Reversal, Colostomy, Cystoscopy With Insertion Of Ureteral Stent Right, Cystoscopy For Urethral Stricture, Hernia Repair, Radiation Therapy, Prostate Surgery   NON-GU PSH: Colostomy - 2011 Hernia Repair - 2008 Repair Bowel Opening - 2013    GU PMH: Balanitis - 01/01/2019 Urinary Retention - 11/29/2018 Bladder Cancer overlapping sites - 10/04/2018 Candidal balanitis - 10/04/2018 Acute Cystitis/UTI - 09/05/2018 Gross hematuria - 08/30/2018, - 08/12/2018 Urinary Retention, Unspec - 08/13/2018 Bladder tumor/neoplasm - 08/12/2018 Stress Incontinence - 08/12/2018 Prostate Cancer, Prostate cancer - 2014, Prostate cancer, - 2014 Renal cyst, Renal cyst, acquired - 2014 Urinary incontinence, Unspec, Urinary incontinence - 2014      PMH Notes:  2011-11-01 09:33:42 - Note: 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   Tobacco Use Assessment Completed: Used Tobacco in last 30 days? Has never drank.  Drinks 1 caffeinated drink per day.     Notes: Death In The Family Father, Alcohol Use, Marital History - Currently Married, Occupation:, Death In The Family Mother, Tobacco  Use, Caffeine Use   REVIEW OF SYSTEMS:    GU Review Male:   Patient reports frequent urination, hard to postpone urination, get up at night to urinate, leakage of urine, trouble starting your stream, and have to strain to urinate . Patient denies burning/ pain with urination, stream starts and stops, erection problems, and penile pain.  Gastrointestinal (Upper):   Patient denies nausea, vomiting, and indigestion/ heartburn.  Gastrointestinal (Lower):   Patient denies diarrhea and constipation.  Constitutional:   Patient denies fever, night sweats, weight loss, and fatigue.  Skin:   Patient denies skin rash/ lesion and itching.  Eyes:   Patient denies double vision and blurred vision.  Ears/ Nose/ Throat:   Patient denies sore throat and sinus problems.  Hematologic/Lymphatic:   Patient denies swollen glands and easy bruising.  Cardiovascular:   Patient denies leg swelling and chest pains.  Respiratory:   Patient denies cough and shortness of breath.  Endocrine:   Patient denies excessive thirst.  Musculoskeletal:   Patient denies back pain and joint pain.  Neurological:   Patient denies headaches and dizziness.  Psychologic:   Patient denies depression and anxiety.   VITAL SIGNS: None   GU PHYSICAL EXAMINATION:    Scrotum: No lesions. No edema. No cysts. No warts.  Urethral Meatus: Normal size. No lesion, no wart, no discharge, no polyp. Normal location.  Penis: Penis uncircumcised. No foreskin warts, no cracks. No dorsal peyronie's plaques, no left corporal peyronie's plaques, no right corporal peyronie's plaques, no scarring, no shaft warts. No balanitis, no meatal stenosis.    MULTI-SYSTEM PHYSICAL EXAMINATION:    Constitutional: Well-nourished. No physical deformities. Normally developed. Good grooming.  Neck: Neck symmetrical, not swollen. Normal tracheal position.  Respiratory: No labored breathing, no use of accessory muscles.   Cardiovascular: Normal temperature, adequate  perfusion of extremities  Skin: No paleness, no jaundice  Neurologic / Psychiatric: Oriented to time, oriented to place, oriented to person. No depression, no anxiety, no agitation.  Gastrointestinal: No mass, no tenderness, no rigidity, non obese abdomen.  Musculoskeletal: Normal gait and station of head and neck.     PAST DATA REVIEWED:  Source Of History:  Patient  Records Review:   Previous Doctor Records, Previous Patient Records   05/01/12 11/01/11 05/19/11 12/09/10 09/26/10 04/11/10 09/08/09 03/03/09  PSA  Total PSA 2.76  2.38  2.49  2.12  2.60  2.74  3.04  2.36     PROCEDURES:         Simple Foley Catheterization - 93267  A 16 French coude catheter was inserted into the bladder using sterile technique. The patient was taught routine catheter care. A urine culture was sent to the lab. Hand irrigation of the bladder with sterile saline was performed. A leg bag was connected. 100 cc of urine was  obtained.   ASSESSMENT:      ICD-10 Details  1 GU:   Urinary Retention - R33.8   2   Bladder Cancer overlapping sites - C67.8    PLAN:           Orders Labs Urine Culture CATH          Schedule Return Visit/Planned Activity: Keep Scheduled Appointment - Follow up MD, Schedule Surgery          Document Letter(s):  Created for Patient: Clinical Summary         Notes:   Catheter placed today and sample collected for urine c/s. He will keep catheter through his upcoming procedure next week. His bladder wasn't terribly full today but pt had been voiding some and also with incontinence. There was no bloody return and it irrigated without resistance or aspiration of blood products and/or tissue material.   Moving forward, pt was instructed to rest and hydrate liberally. He will be contacted regarding his urine c/s results. Pt will keep scheduled appointment for TURBT on 04/13 with Dr Gloriann Loan.        Next Appointment:      Next Appointment: 02/03/2019 12:00 PM    Appointment Type:  Surgery     Location: [facility] [phone]    Provider: Link Snuffer, III, M.D.    Reason for Visit: OP WL TURBT GEM      Signed by Jiles Crocker on 01/27/19 at 1:12 PM (EDT)

## 2019-02-03 NOTE — Anesthesia Postprocedure Evaluation (Signed)
Anesthesia Post Note  Patient: [name]  Procedure(s) Performed: TRANSURETHRAL RESECTION OF BLADDER TUMOR WITH GEMCITABINE (N/A Bladder)     Patient location during evaluation: PACU Anesthesia Type: General Level of consciousness: awake and alert Pain management: pain level controlled Vital Signs Assessment: post-procedure vital signs reviewed and stable Respiratory status: spontaneous breathing, nonlabored ventilation, respiratory function stable and patient connected to nasal cannula oxygen Cardiovascular status: blood pressure returned to baseline and stable Postop Assessment: no apparent nausea or vomiting Anesthetic complications: no    Last Vitals:  Vitals:   02/03/19 1415 02/03/19 1430  BP: (!) 144/93   Pulse: (!) 58   Resp: 18   Temp:  36.4 C  SpO2: 100%     Last Pain:  Vitals:   02/03/19 1430  TempSrc:   PainSc: 2                  Effie Berkshire

## 2019-02-03 NOTE — Transfer of Care (Signed)
Immediate Anesthesia Transfer of Care Note  Patient: [name]  Procedure(s) Performed: TRANSURETHRAL RESECTION OF BLADDER TUMOR WITH GEMCITABINE (N/A Bladder)  Patient Location: PACU  Anesthesia Type:General  Level of Consciousness: awake, alert  and oriented  Airway & Oxygen Therapy: Patient Spontanous Breathing and Patient connected to face mask oxygen  Post-op Assessment: Report given to RN and Post -op Vital signs reviewed and stable  Post vital signs: Reviewed and stable  Last Vitals:  Vitals Value Taken Time  BP    Temp 36.5 C 02/03/2019 12:45 PM  Pulse 69 02/03/2019 12:50 PM  Resp 16 02/03/2019 12:50 PM  SpO2 100 % 02/03/2019 12:50 PM  Vitals shown include unvalidated device data.  Last Pain:  Vitals:   02/03/19 1019  TempSrc: Oral         Complications: No apparent anesthesia complications

## 2019-02-04 ENCOUNTER — Encounter (HOSPITAL_COMMUNITY): Payer: Self-pay | Admitting: Urology

## 2019-02-05 ENCOUNTER — Telehealth: Payer: Self-pay | Admitting: *Deleted

## 2019-02-05 NOTE — Telephone Encounter (Signed)
tct-home spoke with the wife Fern/ Sirius was lying down for a nap, had a turp and bladder instillation on Monday but hasa done well.  Still has foley cath in place and keeping it clean.  No bleeding or bladder problems.  Neighbors are socially distancing but are supporting one another with prayers and food.  Has neighbor that can do errands if need be. Both are staying at home.

## 2019-02-11 DIAGNOSIS — R338 Other retention of urine: Secondary | ICD-10-CM | POA: Diagnosis not present

## 2019-02-11 DIAGNOSIS — C678 Malignant neoplasm of overlapping sites of bladder: Secondary | ICD-10-CM | POA: Diagnosis not present

## 2019-03-04 DIAGNOSIS — H52203 Unspecified astigmatism, bilateral: Secondary | ICD-10-CM | POA: Diagnosis not present

## 2019-03-04 DIAGNOSIS — H26493 Other secondary cataract, bilateral: Secondary | ICD-10-CM | POA: Diagnosis not present

## 2019-03-04 DIAGNOSIS — H0100B Unspecified blepharitis left eye, upper and lower eyelids: Secondary | ICD-10-CM | POA: Diagnosis not present

## 2019-03-04 DIAGNOSIS — H0100A Unspecified blepharitis right eye, upper and lower eyelids: Secondary | ICD-10-CM | POA: Diagnosis not present

## 2019-03-14 DIAGNOSIS — R338 Other retention of urine: Secondary | ICD-10-CM | POA: Diagnosis not present

## 2019-03-30 ENCOUNTER — Emergency Department (HOSPITAL_COMMUNITY)
Admission: EM | Admit: 2019-03-30 | Discharge: 2019-03-30 | Disposition: A | Discharge status: Home / Self Care | Payer: Medicare Other

## 2019-04-01 DIAGNOSIS — L718 Other rosacea: Secondary | ICD-10-CM | POA: Diagnosis not present

## 2019-04-01 DIAGNOSIS — D04122 Carcinoma in situ of skin of left lower eyelid, including canthus: Secondary | ICD-10-CM | POA: Diagnosis not present

## 2019-04-01 DIAGNOSIS — X32XXXD Exposure to sunlight, subsequent encounter: Secondary | ICD-10-CM | POA: Diagnosis not present

## 2019-04-01 DIAGNOSIS — D044 Carcinoma in situ of skin of scalp and neck: Secondary | ICD-10-CM | POA: Diagnosis not present

## 2019-04-01 DIAGNOSIS — L570 Actinic keratosis: Secondary | ICD-10-CM | POA: Diagnosis not present

## 2019-04-14 DIAGNOSIS — R338 Other retention of urine: Secondary | ICD-10-CM | POA: Diagnosis not present

## 2019-04-26 ENCOUNTER — Emergency Department (HOSPITAL_COMMUNITY)
Admission: EM | Admit: 2019-04-26 | Discharge: 2019-04-26 | Disposition: A | Discharge status: Home / Self Care | Payer: Medicare Other

## 2019-04-26 ENCOUNTER — Other Ambulatory Visit: Payer: Self-pay

## 2019-04-26 NOTE — ED Notes (Signed)
Patient requesting leg strap for his catheter leg bag. New leg straps provided and placed on patient.   Patient denies pain and denies any problems with catheter.  A/ox4 Ambulatory in triage.

## 2019-04-29 DIAGNOSIS — Z08 Encounter for follow-up examination after completed treatment for malignant neoplasm: Secondary | ICD-10-CM | POA: Diagnosis not present

## 2019-04-29 DIAGNOSIS — B9689 Other specified bacterial agents as the cause of diseases classified elsewhere: Secondary | ICD-10-CM | POA: Diagnosis not present

## 2019-04-29 DIAGNOSIS — L570 Actinic keratosis: Secondary | ICD-10-CM | POA: Diagnosis not present

## 2019-04-29 DIAGNOSIS — Z85828 Personal history of other malignant neoplasm of skin: Secondary | ICD-10-CM | POA: Diagnosis not present

## 2019-04-29 DIAGNOSIS — D0439 Carcinoma in situ of skin of other parts of face: Secondary | ICD-10-CM | POA: Diagnosis not present

## 2019-04-29 DIAGNOSIS — L02224 Furuncle of groin: Secondary | ICD-10-CM | POA: Diagnosis not present

## 2019-04-29 DIAGNOSIS — D044 Carcinoma in situ of skin of scalp and neck: Secondary | ICD-10-CM | POA: Diagnosis not present

## 2019-04-29 DIAGNOSIS — X32XXXD Exposure to sunlight, subsequent encounter: Secondary | ICD-10-CM | POA: Diagnosis not present

## 2019-05-16 DIAGNOSIS — Z20828 Contact with and (suspected) exposure to other viral communicable diseases: Secondary | ICD-10-CM | POA: Diagnosis not present

## 2019-05-19 DIAGNOSIS — R338 Other retention of urine: Secondary | ICD-10-CM | POA: Diagnosis not present

## 2019-05-27 ENCOUNTER — Encounter (HOSPITAL_COMMUNITY): Payer: Self-pay

## 2019-05-27 ENCOUNTER — Emergency Department (HOSPITAL_COMMUNITY)
Admission: EM | Admit: 2019-05-27 | Discharge: 2019-05-27 | Disposition: A | Discharge status: Home / Self Care | Payer: Medicare Other | Attending: Emergency Medicine | Admitting: Emergency Medicine

## 2019-05-27 ENCOUNTER — Other Ambulatory Visit: Payer: Self-pay

## 2019-05-27 DIAGNOSIS — Z978 Presence of other specified devices: Secondary | ICD-10-CM | POA: Insufficient documentation

## 2019-05-27 DIAGNOSIS — R399 Unspecified symptoms and signs involving the genitourinary system: Secondary | ICD-10-CM | POA: Diagnosis not present

## 2019-05-27 NOTE — ED Provider Notes (Signed)
Pt thought his cath bag strap had broken however it just came unattached.  Pt has no complaints and did not require a full exam or history today   Blanchie Dessert, MD 05/27/19 [phone]

## 2019-05-27 NOTE — ED Triage Notes (Signed)
Pt stated that the straps on his leg bag were broken. Upon inspection,they were not.

## 2019-06-04 ENCOUNTER — Other Ambulatory Visit: Payer: Self-pay

## 2019-06-04 ENCOUNTER — Emergency Department (HOSPITAL_COMMUNITY)
Admission: EM | Admit: 2019-06-04 | Discharge: 2019-06-04 | Discharge status: Absent without leave | Payer: Medicare Other

## 2019-06-16 DIAGNOSIS — R338 Other retention of urine: Secondary | ICD-10-CM | POA: Diagnosis not present

## 2019-07-14 DIAGNOSIS — R338 Other retention of urine: Secondary | ICD-10-CM | POA: Diagnosis not present

## 2019-07-16 DIAGNOSIS — I1310 Hypertensive heart and chronic kidney disease without heart failure, with stage 1 through stage 4 chronic kidney disease, or unspecified chronic kidney disease: Secondary | ICD-10-CM | POA: Diagnosis not present

## 2019-07-16 DIAGNOSIS — M1990 Unspecified osteoarthritis, unspecified site: Secondary | ICD-10-CM | POA: Diagnosis not present

## 2019-07-16 DIAGNOSIS — R809 Proteinuria, unspecified: Secondary | ICD-10-CM | POA: Diagnosis not present

## 2019-07-16 DIAGNOSIS — J309 Allergic rhinitis, unspecified: Secondary | ICD-10-CM | POA: Diagnosis not present

## 2019-07-16 DIAGNOSIS — R339 Retention of urine, unspecified: Secondary | ICD-10-CM | POA: Diagnosis not present

## 2019-07-16 DIAGNOSIS — I2699 Other pulmonary embolism without acute cor pulmonale: Secondary | ICD-10-CM | POA: Diagnosis not present

## 2019-07-16 DIAGNOSIS — N183 Chronic kidney disease, stage 3 (moderate): Secondary | ICD-10-CM | POA: Diagnosis not present

## 2019-07-16 DIAGNOSIS — M109 Gout, unspecified: Secondary | ICD-10-CM | POA: Diagnosis not present

## 2019-08-13 DIAGNOSIS — R338 Other retention of urine: Secondary | ICD-10-CM | POA: Diagnosis not present

## 2019-09-04 DIAGNOSIS — L570 Actinic keratosis: Secondary | ICD-10-CM | POA: Diagnosis not present

## 2019-09-04 DIAGNOSIS — L819 Disorder of pigmentation, unspecified: Secondary | ICD-10-CM | POA: Diagnosis not present

## 2019-09-04 DIAGNOSIS — L738 Other specified follicular disorders: Secondary | ICD-10-CM | POA: Diagnosis not present

## 2019-09-04 DIAGNOSIS — Z85828 Personal history of other malignant neoplasm of skin: Secondary | ICD-10-CM | POA: Diagnosis not present

## 2019-09-10 DIAGNOSIS — R338 Other retention of urine: Secondary | ICD-10-CM | POA: Diagnosis not present

## 2019-09-24 DIAGNOSIS — L700 Acne vulgaris: Secondary | ICD-10-CM | POA: Diagnosis not present

## 2019-09-24 DIAGNOSIS — L570 Actinic keratosis: Secondary | ICD-10-CM | POA: Diagnosis not present

## 2019-09-24 DIAGNOSIS — X32XXXD Exposure to sunlight, subsequent encounter: Secondary | ICD-10-CM | POA: Diagnosis not present

## 2019-09-24 DIAGNOSIS — B078 Other viral warts: Secondary | ICD-10-CM | POA: Diagnosis not present

## 2019-10-10 ENCOUNTER — Other Ambulatory Visit: Payer: Self-pay

## 2019-10-10 ENCOUNTER — Emergency Department (HOSPITAL_COMMUNITY)
Admission: EM | Admit: 2019-10-10 | Discharge: 2019-10-10 | Discharge status: Absent without leave | Payer: Medicare Other

## 2019-12-08 ENCOUNTER — Other Ambulatory Visit: Payer: Self-pay

## 2019-12-08 ENCOUNTER — Ambulatory Visit: Payer: Medicare Other | Attending: Internal Medicine

## 2019-12-08 DIAGNOSIS — Z23 Encounter for immunization: Secondary | ICD-10-CM | POA: Insufficient documentation

## 2019-12-08 NOTE — Progress Notes (Signed)
   Covid-19 Vaccination Clinic  Name:  [name]    MRN: [id number] DOB: [birth year]  12/08/2019  [name] was observed post Covid-19 immunization for 30 minutes based on pre-vaccination screening without incidence. He was provided with Vaccine Information Sheet and instruction to access the V-Safe system.   [name] was instructed to call 911 with any severe reactions post vaccine: Marland Kitchen Difficulty breathing  . Swelling of your face and throat  . A fast heartbeat  . A bad rash all over your body  . Dizziness and weakness    Immunizations Administered    Name Date Dose VIS Date Route   Moderna COVID-19 Vaccine 12/08/2019 10:12 AM 0.5 mL 09/23/2019 Intramuscular   Manufacturer: Moderna   Lot: [id number]   [id number]

## 2019-12-17 DIAGNOSIS — X32XXXD Exposure to sunlight, subsequent encounter: Secondary | ICD-10-CM | POA: Diagnosis not present

## 2019-12-17 DIAGNOSIS — L718 Other rosacea: Secondary | ICD-10-CM | POA: Diagnosis not present

## 2019-12-17 DIAGNOSIS — D044 Carcinoma in situ of skin of scalp and neck: Secondary | ICD-10-CM | POA: Diagnosis not present

## 2019-12-17 DIAGNOSIS — L570 Actinic keratosis: Secondary | ICD-10-CM | POA: Diagnosis not present

## 2019-12-30 DIAGNOSIS — C61 Malignant neoplasm of prostate: Secondary | ICD-10-CM | POA: Diagnosis not present

## 2020-01-06 ENCOUNTER — Ambulatory Visit: Payer: Medicare Other | Attending: Internal Medicine

## 2020-01-06 DIAGNOSIS — Z23 Encounter for immunization: Secondary | ICD-10-CM

## 2020-01-06 NOTE — Progress Notes (Signed)
   Covid-19 Vaccination Clinic  Name:  [name]    MRN: [id number] DOB: Nov 06, 1930  01/06/2020  [name] was observed post Covid-19 immunization for 15 minutes without incident. He was provided with Vaccine Information Sheet and instruction to access the V-Safe system.   [name] was instructed to call 911 with any severe reactions post vaccine: Marland Kitchen Difficulty breathing  . Swelling of face and throat  . A fast heartbeat  . A bad rash all over body  . Dizziness and weakness   Immunizations Administered    Name Date Dose VIS Date Route   Moderna COVID-19 Vaccine 01/06/2020  9:40 AM 0.5 mL 09/23/2019 Intramuscular   Manufacturer: Moderna   Lot: [id number]   [id number]

## 2020-01-09 DIAGNOSIS — C678 Malignant neoplasm of overlapping sites of bladder: Secondary | ICD-10-CM | POA: Diagnosis not present

## 2020-01-11 ENCOUNTER — Encounter (HOSPITAL_COMMUNITY): Payer: Self-pay | Admitting: Emergency Medicine

## 2020-01-11 ENCOUNTER — Emergency Department (HOSPITAL_COMMUNITY)
Admission: EM | Admit: 2020-01-11 | Discharge: 2020-01-11 | Disposition: A | Discharge status: Home / Self Care | Payer: Medicare Other | Attending: Emergency Medicine | Admitting: Emergency Medicine

## 2020-01-11 ENCOUNTER — Other Ambulatory Visit: Payer: Self-pay

## 2020-01-11 DIAGNOSIS — N3001 Acute cystitis with hematuria: Secondary | ICD-10-CM | POA: Diagnosis not present

## 2020-01-11 DIAGNOSIS — Z7982 Long term (current) use of aspirin: Secondary | ICD-10-CM | POA: Diagnosis not present

## 2020-01-11 DIAGNOSIS — N189 Chronic kidney disease, unspecified: Secondary | ICD-10-CM | POA: Insufficient documentation

## 2020-01-11 DIAGNOSIS — Z466 Encounter for fitting and adjustment of urinary device: Secondary | ICD-10-CM | POA: Diagnosis not present

## 2020-01-11 DIAGNOSIS — Z85038 Personal history of other malignant neoplasm of large intestine: Secondary | ICD-10-CM | POA: Diagnosis not present

## 2020-01-11 DIAGNOSIS — N4889 Other specified disorders of penis: Secondary | ICD-10-CM | POA: Diagnosis present

## 2020-01-11 DIAGNOSIS — Z87891 Personal history of nicotine dependence: Secondary | ICD-10-CM | POA: Insufficient documentation

## 2020-01-11 DIAGNOSIS — I129 Hypertensive chronic kidney disease with stage 1 through stage 4 chronic kidney disease, or unspecified chronic kidney disease: Secondary | ICD-10-CM | POA: Diagnosis not present

## 2020-01-11 DIAGNOSIS — Z79899 Other long term (current) drug therapy: Secondary | ICD-10-CM | POA: Diagnosis not present

## 2020-01-11 LAB — URINALYSIS, ROUTINE W REFLEX MICROSCOPIC
Bilirubin Urine: NEGATIVE
Glucose, UA: NEGATIVE mg/dL
Ketones, ur: NEGATIVE mg/dL
Nitrite: POSITIVE — AB
Protein, ur: NEGATIVE mg/dL
Specific Gravity, Urine: 1.006 (ref 1.005–1.030)
WBC, UA: >50 WBC/hpf — ABNORMAL HIGH (ref 0–5)
pH: 6 (ref 5.0–8.0)

## 2020-01-11 MED ORDER — CEPHALEXIN 500 MG PO CAPS: 500.0000 mg | ORAL_CAPSULE | Freq: Three times a day (TID) | ORAL | 0 refills | Status: AC

## 2020-01-11 NOTE — ED Provider Notes (Signed)
[facility] DEPT Provider Note   CSN: [id number] Arrival date & time: 01/11/20  1420    History Chief Complaint  Patient presents with  . Penis Pain    [name] is a 86 y.o. male with past medical history significant for prostate Ca, chronic foley who presents for evaluation of burning. Patient with burning sensation to tip of foley.  Apparently had procedure for a camera was inserted through the tip of the penis to evaluate the bladder.  Patient states he had these procedures about every 2 months.  States he normally gets some burning for about 24 hours after the procedure however this persisted.  Patient states his last burning episode was at 3 AM.  Patient denies any hematuria, clots or difficulty emptying his Foley catheter.  He denies fever, chills, nausea, vomiting, abdominal pain, diarrhea, rashes or lesions.  He took ibuprofen with resolve of his symptoms this morning.  Denies additional aggravating or alleviating factors.   History obtained from patient and past medical record.  No interpreter used  HPI     Past Medical History:  Diagnosis Date  . Adenocarcinoma of colon ([place])   . Arthritis    gout  . Diverticulitis   . Gout    no recent flare  . Hemorrhoids   . History of blood transfusion   . Hypertension   . Incontinence   . Peripheral vascular disease ([place]) 2011   DVT right leg  . Prostate carcinoma ([place])   . Wears dentures    upper   . Wears glasses     Patient Active Problem List   Diagnosis Date Noted  .  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 Colon cancer ([place]) 12/15/2013  . Acute lower GI bleeding 11/14/2013  . History of pulmonary embolism 11/14/2013  . LOCALIZED SUPERFICIAL SWELLING MASS OR LUMP 09/30/2010  . DIVERTICULAR DISEASE 06/21/2009  . ACNE ROSACEA 05/12/2009  . ARTHRALGIA 05/12/2009  . Gout, unspecified 05/11/2008  . HYPERTENSION 05/11/2008  . KIDNEY DISEASE, CHRONIC  NOS 05/11/2008  . COUGH 04/17/2007  . ESOPHAGEAL STRICTURE 01/23/2007  . ADENOCARCINOMA, PROSTATE, HX OF 01/23/2007    Past Surgical History:  Procedure Laterality Date  . COLON SURGERY    . COLONOSCOPY N/A 11/17/2013   Procedure: COLONOSCOPY;  Surgeon: Jeryl Columbia, MD;  Location: [facility];  Service: Endoscopy;  Laterality: N/A;  . CYSTOSCOPY W/ RETROGRADES Bilateral 08/16/2018   Procedure: CYSTOSCOPY WITH BILATERAL  RETROGRADE PYELOGRAM;  Surgeon: Lucas Mallow, MD;  Location: [facility];  Service: Urology;  Laterality: Bilateral;  . diverticulitis    . EYE SURGERY Bilateral    cataract extraction with IOL  . HERNIA REPAIR  1999   inguinal  . HOT HEMOSTASIS N/A 11/17/2013   Procedure: HOT HEMOSTASIS (ARGON PLASMA COAGULATION/BICAP);  Surgeon: Jeryl Columbia, MD;  Location: [facility];  Service: Endoscopy;  Laterality: N/A;  . ILEOSTOMY    . LAPAROSCOPIC LYSIS OF ADHESIONS N/A 01/06/2014   Procedure: LAPAROSCOPIC LYSIS OF ADHESIONS;  Surgeon: Odis Hollingshead, MD;  Location[facility];  Service: General;  Laterality: N/A;  . LAPAROSCOPIC PARTIAL COLECTOMY N/A 01/06/2014   Procedure: LAPAROSCOPIC ASSISTED PARTIAL COLECTOMY;  Surgeon: Odis Hollingshead, MD;  [facility];  Service: General;  Laterality: N/A;  . [place]  . TONSILLECTOMY    . TRANSURETHRAL RESECTION OF BLADDER TUMOR N/A 08/16/2018   Procedure: TRANSURETHRAL RESECTION OF BLADDER TUMOR (TURBT);  Surgeon: Gloriann Loan,  Desiree Hane, MD;  Location[facility];  Service: Urology;  Laterality: N/A;  . TRANSURETHRAL RESECTION OF BLADDER TUMOR WITH MITOMYCIN-C N/A 02/03/2019   Procedure: TRANSURETHRAL RESECTION OF BLADDER TUMOR WITH GEMCITABINE;  Surgeon: Lucas Mallow, MD;  [facility];  Service: Urology;  Laterality: N/A;       No family history on file.  Social History   Tobacco Use  . Smoking status: Former Smoker    Types: Cigarettes, Pipe    Quit date: 01/02/1980    Years since quitting: 40.0  .  Smokeless tobacco: Never Used  Substance Use Topics  . Alcohol use: No  . Drug use: No    Home Medications Prior to Admission medications   Medication Sig Start Date End Date Taking? Authorizing Provider  acetaminophen (TYLENOL) 500 MG tablet Take 1,000 mg by mouth every 8 (eight) hours as needed for mild pain.    [provider]  allopurinol (ZYLOPRIM) 300 MG tablet Take 300 mg by mouth every evening.     [provider]  aspirin EC 81 MG tablet Take 81 mg by mouth daily.    [provider]  carvedilol (COREG) 6.25 MG tablet Take 6.25 mg by mouth 2 (two) times daily with a meal.      [provider]  cephALEXin (KEFLEX) 500 MG capsule Take 1 capsule (500 mg total) by mouth 3 (three) times daily for 5 days. 01/11/20 01/16/20  Cara Thaxton A, PA-C  hydrochlorothiazide (HYDRODIURIL) 50 MG tablet Take 50 mg by mouth every evening.     [provider]  HYDROcodone-acetaminophen (NORCO/VICODIN) 5-325 MG tablet Take 1 tablet by mouth every 4 (four) hours as needed for moderate pain. 02/03/19 02/03/20  Lucas Mallow, MD  Multiple Vitamin (MULTIVITAMIN WITH MINERALS) TABS tablet Take 1 tablet by mouth every evening.     [provider]  potassium chloride SA (K-DUR,KLOR-CON) 20 MEQ tablet Take 20 mEq by mouth every evening.  07/13/18   [provider]    Allergies    Sulfa antibiotics and Sulfonamide derivatives  Review of Systems   Review of Systems  Constitutional: Negative.   HENT: Negative.   Respiratory: Negative.   Cardiovascular: Negative.   Gastrointestinal: Negative.   Genitourinary: Positive for penile pain. Negative for decreased urine volume, difficulty urinating, discharge, dysuria, flank pain, frequency, penile swelling, scrotal swelling, testicular pain and urgency.  Musculoskeletal: Negative.   Neurological: Negative.   All other systems reviewed and are negative.  Physical Exam Updated Vital Signs BP (!)  156/106 (BP Location: Left Arm)   Pulse 77   Temp 98 F (36.7 C) (Oral)   Resp 18   SpO2 99%   Physical Exam Vitals and nursing note reviewed. Exam conducted with a chaperone present.  Constitutional:      General: He is not in acute distress.    Appearance: He is well-developed. He is not ill-appearing, toxic-appearing or diaphoretic.  HENT:     Head: Normocephalic and atraumatic.     Nose: Nose normal.     Mouth/Throat:     Mouth: Mucous membranes are moist.     Pharynx: Oropharynx is clear.  Eyes:     Pupils: Pupils are equal, round, and reactive to light.  Cardiovascular:     Rate and Rhythm: Normal rate and regular rhythm.     Pulses: Normal pulses.     Heart sounds: Normal heart sounds.  Pulmonary:     Effort: Pulmonary effort is  normal. No respiratory distress.     Breath sounds: Normal breath sounds.  Abdominal:     General: Bowel sounds are normal. There is no distension.     Palpations: Abdomen is soft.  Genitourinary:    Comments: Foley in place with yellow urine without clots. No rashes or lesions. Musculoskeletal:        General: Normal range of motion.     Cervical back: Normal range of motion and neck supple.  Skin:    General: Skin is warm and dry.  Neurological:     Mental Status: He is alert.    ED Results / Procedures / Treatments   Labs (all labs ordered are listed, but only abnormal results are displayed) Labs Reviewed  URINALYSIS, ROUTINE W REFLEX MICROSCOPIC - Abnormal; Notable for the following components:      Result Value   APPearance HAZY (*)    Hgb urine dipstick SMALL (*)    Nitrite POSITIVE (*)    Leukocytes,Ua LARGE (*)    WBC, UA >50 (*)    Bacteria, UA MANY (*)    All other components within normal limits  URINE CULTURE   EKG None  Radiology No results found.  Procedures Procedures (including critical care time)  Medications Ordered in ED Medications - No data to display  ED Course  I have reviewed the triage vital  signs and the nursing notes.  Pertinent labs & imaging results that were available during my care of the patient were reviewed by me and considered in my medical decision making (see chart for details).  86 year old presents for evaluation of penis pain. States recent procedure at [facility].  Afebrile, nonseptic, non-ill-appearing.  Has had burning to the tip of his penis since. Pain significantly improved thus far however just want to be "Checked out." Foley in place with yellow urine. No clots. Urine flowing without difficulty. No current pain or complaints. No rashes or lesions. UA positive for infection. Will plan on bladder scan and discuss with Urology given recent procedure.   Bladder scan 0  UA consistent with infection, sent for culture  No flank pain or abd pain to suggest stone.  CONSULT with Dr. Beatrix Fetters, Urology. Patient with recent cystoscopy and foley change. He has reviewed chart. Recommends Keflex x 5 days and patient can follow-up in office.  Discussed plan with patient.  He is agreeable to this.  Not appear septic at this time.  Given prescription for Keflex.  Discussed strict return precautions.  Patient voiced understanding and is agreeable for follow-up. Offered to contact family to discuss plan however patient declines. Dc home in stable condition.  The patient has been appropriately medically screened and/or stabilized in the ED. I have low suspicion for any other emergent medical condition which would require further screening, evaluation or treatment in the ED or require inpatient management.  Patient seen and evaluated by attending Dr. Eulis Foster who agrees with above treatment, plan and disposition.    MDM Rules/Calculators/A&P                       Final Clinical Impression(s) / ED Diagnoses Final diagnoses:  Acute cystitis with hematuria    Rx / DC Orders ED Discharge Orders         Ordered    cephALEXin (KEFLEX) 500 MG capsule  3 times daily      01/11/20 1553           Geramy Lamorte A,  PA-C 01/11/20 1555    Daleen Bo, MD 01/12/20 [phone]

## 2020-01-11 NOTE — ED Triage Notes (Signed)
Patient here from home reporting penis burning after procedure on Friday. Reports that urology went down in urethra with camera to look at bladder and now has burning from the "chemical they used". States that it is better since Friday.

## 2020-01-11 NOTE — ED Notes (Signed)
ED Provider at bedside. 

## 2020-01-11 NOTE — Discharge Instructions (Signed)
Take the antibiotics for your UTI. [name] take Tylenol for pain

## 2020-01-11 NOTE — ED Provider Notes (Signed)
  Face-to-face evaluation   History: He presents for evaluation of a burning sensation, in his penis which occurs sporadically for the last 2 days but is generally less.  He also had some pain yesterday and the day prior, in the prepelvic region.  He had a catheter placed, after videography of bladder, 2 days ago.  He denies fever, vomiting or dizziness.  Physical exam: Elderly, alert and cooperative.  Abdomen soft and nontender.  No palpable mass in the suprapubic region.  Patient is nontoxic in appearance.  Medical screening examination/treatment/procedure(s) were conducted as a shared visit with non-physician practitioner(s) and myself.  I personally evaluated the patient during the encounter    Daleen Bo, MD 01/12/20 [phone]

## 2020-01-12 DIAGNOSIS — M109 Gout, unspecified: Secondary | ICD-10-CM | POA: Diagnosis not present

## 2020-01-12 DIAGNOSIS — I1310 Hypertensive heart and chronic kidney disease without heart failure, with stage 1 through stage 4 chronic kidney disease, or unspecified chronic kidney disease: Secondary | ICD-10-CM | POA: Diagnosis not present

## 2020-01-12 DIAGNOSIS — Z125 Encounter for screening for malignant neoplasm of prostate: Secondary | ICD-10-CM | POA: Diagnosis not present

## 2020-01-14 LAB — URINE CULTURE: Culture: >=100000 — AB

## 2020-01-15 ENCOUNTER — Telehealth: Payer: Self-pay

## 2020-01-15 NOTE — Telephone Encounter (Signed)
Post ED Visit - Positive Culture Follow-up  Culture report reviewed by antimicrobial stewardship pharmacist: [facility] Team []  Elenor Quinones, Pharm.D. []  Heide Guile, Pharm.D., BCPS AQ-ID []  Parks Neptune, Pharm.D., BCPS []  Alycia Rossetti, Pharm.D., BCPS []  [place], [place].D., BCPS, AAHIVP []  Legrand Como, Pharm.D., BCPS, AAHIVP []  Salome Arnt, PharmD, BCPS []  Johnnette Gourd, PharmD, BCPS []  Hughes Better, PharmD, BCPS []  Leeroy Cha, PharmD []  Laqueta Linden, PharmD, BCPS []  Albertina Parr, PharmD  [place] Team []  Leodis Sias, PharmD []  Lindell Spar, PharmD []  Royetta Asal, PharmD []  Graylin Shiver, Rph []  Rema Fendt) Glennon Mac, PharmD []  Arlyn Dunning, PharmD []  Netta Cedars, PharmD []  Dia Sitter, PharmD []  Leone Haven, PharmD []  Gretta Arab, PharmD []  Theodis Shove, PharmD []  Peggyann Juba, PharmD [x]  Reuel Boom, PharmD   Positive urine culture Treated with Cephalexin, organism sensitive to the same and no further patient follow-up is required at this time.  Genia Del 01/15/2020, 11:18 AM

## 2020-01-19 DIAGNOSIS — E441 Mild protein-calorie malnutrition: Secondary | ICD-10-CM | POA: Diagnosis not present

## 2020-01-19 DIAGNOSIS — M1990 Unspecified osteoarthritis, unspecified site: Secondary | ICD-10-CM | POA: Diagnosis not present

## 2020-01-19 DIAGNOSIS — Z1331 Encounter for screening for depression: Secondary | ICD-10-CM | POA: Diagnosis not present

## 2020-01-19 DIAGNOSIS — R809 Proteinuria, unspecified: Secondary | ICD-10-CM | POA: Diagnosis not present

## 2020-01-19 DIAGNOSIS — C189 Malignant neoplasm of colon, unspecified: Secondary | ICD-10-CM | POA: Diagnosis not present

## 2020-01-19 DIAGNOSIS — Z8546 Personal history of malignant neoplasm of prostate: Secondary | ICD-10-CM | POA: Diagnosis not present

## 2020-01-19 DIAGNOSIS — R339 Retention of urine, unspecified: Secondary | ICD-10-CM | POA: Diagnosis not present

## 2020-01-19 DIAGNOSIS — Z1339 Encounter for screening examination for other mental health and behavioral disorders: Secondary | ICD-10-CM | POA: Diagnosis not present

## 2020-01-19 DIAGNOSIS — I2699 Other pulmonary embolism without acute cor pulmonale: Secondary | ICD-10-CM | POA: Diagnosis not present

## 2020-01-19 DIAGNOSIS — N1831 Chronic kidney disease, stage 3a: Secondary | ICD-10-CM | POA: Diagnosis not present

## 2020-01-19 DIAGNOSIS — Z0000 Encounter for general adult medical examination without abnormal findings: Secondary | ICD-10-CM | POA: Diagnosis not present

## 2020-01-19 DIAGNOSIS — M109 Gout, unspecified: Secondary | ICD-10-CM | POA: Diagnosis not present

## 2020-01-19 DIAGNOSIS — D6869 Other thrombophilia: Secondary | ICD-10-CM | POA: Diagnosis not present

## 2020-01-19 DIAGNOSIS — I739 Peripheral vascular disease, unspecified: Secondary | ICD-10-CM | POA: Diagnosis not present

## 2020-01-28 ENCOUNTER — Emergency Department (HOSPITAL_COMMUNITY)
Admission: EM | Admit: 2020-01-28 | Discharge: 2020-01-28 | Disposition: A | Discharge status: Home / Self Care | Payer: Medicare Other | Attending: Emergency Medicine | Admitting: Emergency Medicine

## 2020-01-28 ENCOUNTER — Encounter (HOSPITAL_COMMUNITY): Payer: Self-pay

## 2020-01-28 ENCOUNTER — Other Ambulatory Visit: Payer: Self-pay

## 2020-01-28 DIAGNOSIS — Z7982 Long term (current) use of aspirin: Secondary | ICD-10-CM | POA: Diagnosis not present

## 2020-01-28 DIAGNOSIS — I10 Essential (primary) hypertension: Secondary | ICD-10-CM | POA: Insufficient documentation

## 2020-01-28 DIAGNOSIS — Z87891 Personal history of nicotine dependence: Secondary | ICD-10-CM | POA: Diagnosis not present

## 2020-01-28 DIAGNOSIS — Y731 Therapeutic (nonsurgical) and rehabilitative gastroenterology and urology devices associated with adverse incidents: Secondary | ICD-10-CM | POA: Diagnosis not present

## 2020-01-28 DIAGNOSIS — Z960 Presence of urogenital implants: Secondary | ICD-10-CM | POA: Insufficient documentation

## 2020-01-28 DIAGNOSIS — Z85038 Personal history of other malignant neoplasm of large intestine: Secondary | ICD-10-CM | POA: Diagnosis not present

## 2020-01-28 DIAGNOSIS — Z978 Presence of other specified devices: Secondary | ICD-10-CM

## 2020-01-28 DIAGNOSIS — T83098A Other mechanical complication of other indwelling urethral catheter, initial encounter: Secondary | ICD-10-CM | POA: Diagnosis not present

## 2020-01-28 DIAGNOSIS — Z8546 Personal history of malignant neoplasm of prostate: Secondary | ICD-10-CM | POA: Insufficient documentation

## 2020-01-28 NOTE — ED Triage Notes (Signed)
Pt states that his catheter started leaking last night  Pt has had this catheter for one month

## 2020-01-28 NOTE — ED Notes (Signed)
MD at bedside. 

## 2020-01-28 NOTE — Discharge Instructions (Addendum)
Please schedule follow-up appointment with Dr. Gloriann Loan regarding ongoing catheter care.  Return to ER if you develop any abdominal pain, fever, other new concerning symptom.

## 2020-01-28 NOTE — ED Provider Notes (Signed)
[facility] DEPT Provider Note   CSN: [id number] Arrival date & time: 01/28/20  [id number]     History No chief complaint on file.   [name] is a 86 y.o. male.  Presented to ER with concern for catheter problem.  States that his catheter bag specifically was having a week last night.  States that his catheter itself seems to be functioning appropriately.  Has not had any foul-smelling urine, pain in catheter, fever, abdominal pain.  States that he had this catheter changed a couple weeks ago, used to get his catheter changed once every month but then has been spaced out to once every 3 months.  Followed by Dr. Gloriann Loan.  Reports she was diagnosed with a UTI a couple weeks ago and completed a course of antibiotics that was prescribed to him, cephalexin.  Obtained additional history from chart review, reviewed notes from recent ER visit on 3/21, UTI, culture sensitive to cephalexin.    HPI     Past Medical History:  Diagnosis Date  . Adenocarcinoma of colon ([place])   . Arthritis    gout  . Diverticulitis   . Gout    no recent flare  . Hemorrhoids   . History of blood transfusion   . Hypertension   . Incontinence   . Peripheral vascular disease ([place]) 2011   DVT right leg  . Prostate carcinoma ([place])   . Wears dentures    upper   . Wears glasses     Patient Active Problem List   Diagnosis Date Noted  .  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 Colon cancer ([place]) 12/15/2013  . Acute lower GI bleeding 11/14/2013  . History of pulmonary embolism 11/14/2013  . LOCALIZED SUPERFICIAL SWELLING MASS OR LUMP 09/30/2010  . DIVERTICULAR DISEASE 06/21/2009  . ACNE ROSACEA 05/12/2009  . ARTHRALGIA 05/12/2009  . Gout, unspecified 05/11/2008  . HYPERTENSION 05/11/2008  . KIDNEY DISEASE, CHRONIC NOS 05/11/2008  . COUGH 04/17/2007  . ESOPHAGEAL STRICTURE 01/23/2007  . ADENOCARCINOMA, PROSTATE, HX OF 01/23/2007    Past Surgical  History:  Procedure Laterality Date  . COLON SURGERY    . COLONOSCOPY N/A 11/17/2013   Procedure: COLONOSCOPY;  Surgeon: Jeryl Columbia, MD;  [facility];  Service: Endoscopy;  Laterality: N/A;  . CYSTOSCOPY W/ RETROGRADES Bilateral 08/16/2018   Procedure: CYSTOSCOPY WITH BILATERAL  RETROGRADE PYELOGRAM;  Surgeon: Lucas Mallow, MD;  [facility];  Service: Urology;  Laterality: Bilateral;  . diverticulitis    . EYE SURGERY Bilateral    cataract extraction with IOL  . HERNIA REPAIR  1999   inguinal  . HOT HEMOSTASIS N/A 11/17/2013   Procedure: HOT HEMOSTASIS (ARGON PLASMA COAGULATION/BICAP);  Surgeon: Jeryl Columbia, MD;  Location: [facility];  Service: Endoscopy;  Laterality: N/A;  . ILEOSTOMY    . LAPAROSCOPIC LYSIS OF ADHESIONS N/A 01/06/2014   Procedure: LAPAROSCOPIC LYSIS OF ADHESIONS;  Surgeon: Odis Hollingshead, MD;  [facility];  Service: General;  Laterality: N/A;  . LAPAROSCOPIC PARTIAL COLECTOMY N/A 01/06/2014   Procedure: LAPAROSCOPIC ASSISTED PARTIAL COLECTOMY;  Surgeon: Odis Hollingshead, MD;  [facility];  Service: General;  Laterality: N/A;  . [place]  . TONSILLECTOMY    . TRANSURETHRAL RESECTION OF BLADDER TUMOR N/A 08/16/2018   Procedure: TRANSURETHRAL RESECTION OF BLADDER TUMOR (TURBT);  Surgeon: Lucas Mallow, MD;  [facility];  Service: Urology;  Laterality: N/A;  .  TRANSURETHRAL RESECTION OF BLADDER TUMOR WITH MITOMYCIN-C N/A 02/03/2019   Procedure: TRANSURETHRAL RESECTION OF BLADDER TUMOR WITH GEMCITABINE;  Surgeon: Lucas Mallow, MD;  [facility];  Service: Urology;  Laterality: N/A;       History reviewed. No pertinent family history.  Social History   Tobacco Use  . Smoking status: Former Smoker    Types: Cigarettes, Pipe    Quit date: 01/02/1980    Years since quitting: 40.0  . Smokeless tobacco: Never Used  Substance Use Topics  . Alcohol use: No  . Drug use: No    Home Medications Prior  to Admission medications   Medication Sig Start Date End Date Taking? Authorizing Provider  acetaminophen (TYLENOL) 500 MG tablet Take 1,000 mg by mouth every 8 (eight) hours as needed for mild pain.    [provider]  allopurinol (ZYLOPRIM) 300 MG tablet Take 300 mg by mouth every evening.     [provider]  aspirin EC 81 MG tablet Take 81 mg by mouth daily.    [provider]  carvedilol (COREG) 6.25 MG tablet Take 6.25 mg by mouth 2 (two) times daily with a meal.      [provider]  hydrochlorothiazide (HYDRODIURIL) 50 MG tablet Take 50 mg by mouth every evening.     [provider]  HYDROcodone-acetaminophen (NORCO/VICODIN) 5-325 MG tablet Take 1 tablet by mouth every 4 (four) hours as needed for moderate pain. 02/03/19 02/03/20  Lucas Mallow, MD  Multiple Vitamin (MULTIVITAMIN WITH MINERALS) TABS tablet Take 1 tablet by mouth every evening.     [provider]  potassium chloride SA (K-DUR,KLOR-CON) 20 MEQ tablet Take 20 mEq by mouth every evening.  07/13/18   [provider]    Allergies    Sulfa antibiotics and Sulfonamide derivatives  Review of Systems   Review of Systems  Constitutional: Negative for chills and fever.  HENT: Negative for ear pain and sore throat.   Eyes: Negative for pain and visual disturbance.  Respiratory: Negative for cough and shortness of breath.   Cardiovascular: Negative for chest pain and palpitations.  Gastrointestinal: Negative for abdominal pain and vomiting.  Genitourinary: Negative for dysuria and hematuria.  Musculoskeletal: Negative for arthralgias and back pain.  Skin: Negative for color change and rash.  Neurological: Negative for seizures and syncope.  All other systems reviewed and are negative.   Physical Exam Updated Vital Signs BP (!) 158/81 (BP Location: Left Arm)   Pulse 77   Temp 97.6 F (36.4 C) (Oral)   Resp 17   SpO2 100%   Physical Exam Vitals and  nursing note reviewed.  Constitutional:      Appearance: He is well-developed.  HENT:     Head: Normocephalic and atraumatic.  Eyes:     Conjunctiva/sclera: Conjunctivae normal.  Cardiovascular:     Rate and Rhythm: Normal rate and regular rhythm.     Heart sounds: No murmur.  Pulmonary:     Effort: Pulmonary effort is normal. No respiratory distress.     Breath sounds: Normal breath sounds.  Abdominal:     Palpations: Abdomen is soft.     Tenderness: There is no abdominal tenderness.  Genitourinary:    Comments: Foley catheter in place Musculoskeletal:        General: No deformity or signs of injury.     Cervical back: Neck supple.  Skin:    General: Skin is warm and dry.  Neurological:  General: No focal deficit present.     Mental Status: He is alert and oriented to person, place, and time.     ED Results / Procedures / Treatments   Labs (all labs ordered are listed, but only abnormal results are displayed) Labs Reviewed - No data to display  EKG None  Radiology No results found.  Procedures Procedures (including critical care time)  Medications Ordered in ED Medications - No data to display  ED Course  I have reviewed the triage vital signs and the nursing notes.  Pertinent labs & imaging results that were available during my care of the patient were reviewed by me and considered in my medical decision making (see chart for details).    MDM Rules/Calculators/A&P                      86 year old male presents to ER with concern that his Foley bag is leaking.  On exam he is well-appearing, normal vital signs, no other complaints or concerns.  Will replace Foley bag, recommend recheck with his urologist.  Given he has no symptoms and no concerning physical exam findings, do not feel patient needs lab or urinalysis performed this morning.  Reported compliance with cephalexin for recent UTI.    After the discussed management above, the patient was  determined to be safe for discharge.  The patient was in agreement with this plan and all questions regarding their care were answered.  ED return precautions were discussed and the patient will return to the ED with any significant worsening of condition.    Final Clinical Impression(s) / ED Diagnoses Final diagnoses:  Chronic indwelling Foley catheter    Rx / DC Orders ED Discharge Orders    None       Lucrezia Starch, MD 01/28/20 [phone]

## 2020-02-10 DIAGNOSIS — R338 Other retention of urine: Secondary | ICD-10-CM | POA: Diagnosis not present

## 2020-02-18 DIAGNOSIS — X32XXXD Exposure to sunlight, subsequent encounter: Secondary | ICD-10-CM | POA: Diagnosis not present

## 2020-02-18 DIAGNOSIS — Z85828 Personal history of other malignant neoplasm of skin: Secondary | ICD-10-CM | POA: Diagnosis not present

## 2020-02-18 DIAGNOSIS — L570 Actinic keratosis: Secondary | ICD-10-CM | POA: Diagnosis not present

## 2020-02-18 DIAGNOSIS — Z08 Encounter for follow-up examination after completed treatment for malignant neoplasm: Secondary | ICD-10-CM | POA: Diagnosis not present

## 2020-02-18 DIAGNOSIS — L820 Inflamed seborrheic keratosis: Secondary | ICD-10-CM | POA: Diagnosis not present

## 2020-03-09 DIAGNOSIS — R338 Other retention of urine: Secondary | ICD-10-CM | POA: Diagnosis not present

## 2020-04-12 ENCOUNTER — Encounter (HOSPITAL_COMMUNITY): Payer: Self-pay | Admitting: Emergency Medicine

## 2020-04-12 ENCOUNTER — Other Ambulatory Visit: Payer: Self-pay

## 2020-04-12 ENCOUNTER — Emergency Department (HOSPITAL_COMMUNITY)
Admission: EM | Admit: 2020-04-12 | Discharge: 2020-04-12 | Discharge status: Against Medical Advice | Payer: Medicare Other | Attending: Emergency Medicine | Admitting: Emergency Medicine

## 2020-04-12 DIAGNOSIS — T83098A Other mechanical complication of other indwelling urethral catheter, initial encounter: Secondary | ICD-10-CM | POA: Diagnosis not present

## 2020-04-12 DIAGNOSIS — Z5321 Procedure and treatment not carried out due to patient leaving prior to being seen by health care provider: Secondary | ICD-10-CM | POA: Diagnosis not present

## 2020-04-12 DIAGNOSIS — Y802 Prosthetic and other implants, materials and accessory physical medicine devices associated with adverse incidents: Secondary | ICD-10-CM | POA: Insufficient documentation

## 2020-04-12 DIAGNOSIS — R339 Retention of urine, unspecified: Secondary | ICD-10-CM | POA: Diagnosis present

## 2020-04-12 NOTE — ED Triage Notes (Signed)
Patient states when he is urinating in the catheter his penis hurts. Patient states he thinks it is a blockage.

## 2020-05-11 DIAGNOSIS — L570 Actinic keratosis: Secondary | ICD-10-CM | POA: Diagnosis not present

## 2020-05-11 DIAGNOSIS — X32XXXD Exposure to sunlight, subsequent encounter: Secondary | ICD-10-CM | POA: Diagnosis not present

## 2020-05-11 DIAGNOSIS — C44319 Basal cell carcinoma of skin of other parts of face: Secondary | ICD-10-CM | POA: Diagnosis not present

## 2020-05-11 DIAGNOSIS — B9689 Other specified bacterial agents as the cause of diseases classified elsewhere: Secondary | ICD-10-CM | POA: Diagnosis not present

## 2020-05-11 DIAGNOSIS — L0202 Furuncle of face: Secondary | ICD-10-CM | POA: Diagnosis not present

## 2020-05-11 DIAGNOSIS — R338 Other retention of urine: Secondary | ICD-10-CM | POA: Diagnosis not present

## 2020-05-21 ENCOUNTER — Emergency Department (HOSPITAL_COMMUNITY)
Admission: EM | Admit: 2020-05-21 | Discharge: 2020-05-21 | Disposition: A | Discharge status: Home / Self Care | Payer: Medicare Other | Attending: Emergency Medicine | Admitting: Emergency Medicine

## 2020-05-21 ENCOUNTER — Encounter (HOSPITAL_COMMUNITY): Payer: Self-pay

## 2020-05-21 ENCOUNTER — Other Ambulatory Visit: Payer: Self-pay

## 2020-05-21 DIAGNOSIS — T83098A Other mechanical complication of other indwelling urethral catheter, initial encounter: Secondary | ICD-10-CM | POA: Diagnosis not present

## 2020-05-21 DIAGNOSIS — Z7982 Long term (current) use of aspirin: Secondary | ICD-10-CM | POA: Insufficient documentation

## 2020-05-21 DIAGNOSIS — T83038A Leakage of other indwelling urethral catheter, initial encounter: Secondary | ICD-10-CM | POA: Diagnosis not present

## 2020-05-21 DIAGNOSIS — F17210 Nicotine dependence, cigarettes, uncomplicated: Secondary | ICD-10-CM | POA: Diagnosis not present

## 2020-05-21 DIAGNOSIS — Z79899 Other long term (current) drug therapy: Secondary | ICD-10-CM | POA: Diagnosis not present

## 2020-05-21 DIAGNOSIS — Z8546 Personal history of malignant neoplasm of prostate: Secondary | ICD-10-CM | POA: Insufficient documentation

## 2020-05-21 DIAGNOSIS — I10 Essential (primary) hypertension: Secondary | ICD-10-CM | POA: Insufficient documentation

## 2020-05-21 DIAGNOSIS — Z85038 Personal history of other malignant neoplasm of large intestine: Secondary | ICD-10-CM | POA: Insufficient documentation

## 2020-05-21 DIAGNOSIS — T839XXA Unspecified complication of genitourinary prosthetic device, implant and graft, initial encounter: Secondary | ICD-10-CM

## 2020-05-21 NOTE — ED Provider Notes (Signed)
[facility] DEPT Provider Note   CSN: [id number] Arrival date & time: 05/21/20  0086     History Chief Complaint  Patient presents with  . urine leaking  . needs a leg bag    [name] is a 86 y.o. male.  HPI   86 year old male with a history of adenocarcinoma of the colon, diverticulitis, gout, hemorrhoids, hypertension, PVD, prostate carcinoma, dentures, who presents the emergency department today for evaluation of a Foley problem.  States that he follows with [facility] and has had a Foley for the last 2 years.  He had it switched out on 7/20 to a different kind of bag.  For the last 2 days he has noticed some leaking from the bag.  He denies any leakage from around his penis.  Denies any other symptoms.  He is requesting that we switch out his leg bag.  Past Medical History:  Diagnosis Date  . Adenocarcinoma of colon ([place])   . Arthritis    gout  . Diverticulitis   . Gout    no recent flare  . Hemorrhoids   . History of blood transfusion   . Hypertension   . Incontinence   . Peripheral vascular disease ([place]) 2011   DVT right leg  . Prostate carcinoma ([place])   . Wears dentures    upper   . Wears glasses     Patient Active Problem List   Diagnosis Date Noted  .  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 Colon cancer ([place]) 12/15/2013  . Acute lower GI bleeding 11/14/2013  . History of pulmonary embolism 11/14/2013  . LOCALIZED SUPERFICIAL SWELLING MASS OR LUMP 09/30/2010  . DIVERTICULAR DISEASE 06/21/2009  . ACNE ROSACEA 05/12/2009  . ARTHRALGIA 05/12/2009  . Gout, unspecified 05/11/2008  . HYPERTENSION 05/11/2008  . KIDNEY DISEASE, CHRONIC NOS 05/11/2008  . COUGH 04/17/2007  . ESOPHAGEAL STRICTURE 01/23/2007  . ADENOCARCINOMA, PROSTATE, HX OF 01/23/2007    Past Surgical History:  Procedure Laterality Date  . COLON SURGERY    . COLONOSCOPY N/A 11/17/2013   Procedure: COLONOSCOPY;   Surgeon: Jeryl Columbia, MD;  Location: [facility];  Service: Endoscopy;  Laterality: N/A;  . CYSTOSCOPY W/ RETROGRADES Bilateral 08/16/2018   Procedure: CYSTOSCOPY WITH BILATERAL  RETROGRADE PYELOGRAM;  Surgeon: Lucas Mallow, MD;  Location: [facility];  Service: Urology;  Laterality: Bilateral;  . diverticulitis    . EYE SURGERY Bilateral    cataract extraction with IOL  . HERNIA REPAIR  1999   inguinal  . HOT HEMOSTASIS N/A 11/17/2013   Procedure: HOT HEMOSTASIS (ARGON PLASMA COAGULATION/BICAP);  Surgeon: Jeryl Columbia, MD;  Location: [facility];  Service: Endoscopy;  Laterality: N/A;  . ILEOSTOMY    . LAPAROSCOPIC LYSIS OF ADHESIONS N/A 01/06/2014   Procedure: LAPAROSCOPIC LYSIS OF ADHESIONS;  Surgeon: Odis Hollingshead, MD;  [facility]: [facility];  Service: General;  Laterality: N/A;  . LAPAROSCOPIC PARTIAL COLECTOMY N/A 01/06/2014   Procedure: LAPAROSCOPIC ASSISTED PARTIAL COLECTOMY;  Surgeon: Odis Hollingshead, MD;  Location[facility];  Service: General;  Laterality: N/A;  . [place]  . TONSILLECTOMY    . TRANSURETHRAL RESECTION OF BLADDER TUMOR N/A 08/16/2018   Procedure: TRANSURETHRAL RESECTION OF BLADDER TUMOR (TURBT);  Surgeon: Lucas Mallow, MD;  [facility];  Service: Urology;  Laterality: N/A;  . TRANSURETHRAL RESECTION OF BLADDER TUMOR WITH MITOMYCIN-C N/A 02/03/2019   Procedure: TRANSURETHRAL RESECTION OF BLADDER  TUMOR WITH GEMCITABINE;  Surgeon: Lucas Mallow, MD;  [facility];  Service: Urology;  Laterality: N/A;       History reviewed. No pertinent family history.  Social History   Tobacco Use  . Smoking status: Former Smoker    Types: Cigarettes, Pipe    Quit date: 01/02/1980    Years since quitting: 40.4  . Smokeless tobacco: Never Used  Vaping Use  . Vaping Use: Never used  Substance Use Topics  . Alcohol use: No  . Drug use: No    Home Medications Prior to Admission medications   Medication Sig Start Date End Date Taking?  Authorizing Provider  acetaminophen (TYLENOL) 500 MG tablet Take 1,000 mg by mouth every 8 (eight) hours as needed for mild pain.    [provider]  allopurinol (ZYLOPRIM) 300 MG tablet Take 300 mg by mouth every evening.     [provider]  aspirin EC 81 MG tablet Take 81 mg by mouth daily.    [provider]  carvedilol (COREG) 6.25 MG tablet Take 6.25 mg by mouth 2 (two) times daily with a meal.      [provider]  hydrochlorothiazide (HYDRODIURIL) 50 MG tablet Take 50 mg by mouth every evening.     [provider]  Multiple Vitamin (MULTIVITAMIN WITH MINERALS) TABS tablet Take 1 tablet by mouth every evening.     [provider]  potassium chloride SA (K-DUR,KLOR-CON) 20 MEQ tablet Take 20 mEq by mouth every evening.  07/13/18   [provider]    Allergies    Sulfa antibiotics and Sulfonamide derivatives  Review of Systems   Review of Systems  Constitutional: Negative for fever.  Genitourinary:       Foley problem    Physical Exam Updated Vital Signs BP (!) 149/92   Pulse 91   Temp 97.9 F (36.6 C) (Oral)   Resp 16   Ht 5\' 10"  (1.778 m)   Wt 79.4 kg   SpO2 99%   BMI 25.11 kg/m   Physical Exam Constitutional:      General: He is not in acute distress.    Appearance: He is well-developed.  Eyes:     Conjunctiva/sclera: Conjunctivae normal.  Cardiovascular:     Rate and Rhythm: Normal rate.  Pulmonary:     Effort: Pulmonary effort is normal.  Genitourinary:    Comments: Clear urine in foley bag Skin:    General: Skin is warm and dry.  Neurological:     Mental Status: He is alert and oriented to person, place, and time.     ED Results / Procedures / Treatments   Labs (all labs ordered are listed, but only abnormal results are displayed) Labs Reviewed - No data to display  EKG None  Radiology No results found.  Procedures Procedures (including critical care time)  Medications Ordered in  ED Medications - No data to display  ED Course  I have reviewed the triage vital signs and the nursing notes.  Pertinent labs & imaging results that were available during my care of the patient were reviewed by me and considered in my medical decision making (see chart for details).    MDM Rules/Calculators/A&P                          86 year old male  who presents for evaluation of a Foley problem.  States that [facility] follows with [facility] and has  had a Foley for the last 2 years.  He had it switched out on 7/20 to a different kind of bag.  For the last 2 days he has noticed some leaking from the bag.    Foley bag was exchanged. Pt advised to f/u with urology. Advised on return precautions. He voices understanding of the plan and reasons to return. All questions answered, pt stable for discharge.   Final Clinical Impression(s) / ED Diagnoses Final diagnoses:  Problem with Foley catheter, initial encounter [facility])    Rx / DC Orders ED Discharge Orders    None       Rodney Booze, PA-C 05/21/20 [id number]    Maudie Flakes, MD 05/21/20 1007

## 2020-05-21 NOTE — Discharge Instructions (Signed)
Please follow up with your urologist provider within 5-7 days for re-evaluation of your symptoms.    Please return to the emergency department for any new or worsening symptoms.

## 2020-05-21 NOTE — ED Notes (Signed)
Patient had a leg bag on, but patient states the bag he had was too big. Patient given a new leg bag which was slightly smaller.

## 2020-05-21 NOTE — ED Triage Notes (Signed)
Patient states he has woke up 2 mornings in a row and has been wet with urine. Patient states that he has a cath in place. Patient is also wanting a leg bag.  Patient states he went to [facility] urology on 7/20.

## 2020-06-08 DIAGNOSIS — C4441 Basal cell carcinoma of skin of scalp and neck: Secondary | ICD-10-CM | POA: Diagnosis not present

## 2020-06-08 DIAGNOSIS — Z85828 Personal history of other malignant neoplasm of skin: Secondary | ICD-10-CM | POA: Diagnosis not present

## 2020-06-08 DIAGNOSIS — Z08 Encounter for follow-up examination after completed treatment for malignant neoplasm: Secondary | ICD-10-CM | POA: Diagnosis not present

## 2020-07-15 DIAGNOSIS — C678 Malignant neoplasm of overlapping sites of bladder: Secondary | ICD-10-CM | POA: Diagnosis not present

## 2020-07-15 DIAGNOSIS — C61 Malignant neoplasm of prostate: Secondary | ICD-10-CM | POA: Diagnosis not present

## 2020-08-02 DIAGNOSIS — M109 Gout, unspecified: Secondary | ICD-10-CM | POA: Diagnosis not present

## 2020-08-02 DIAGNOSIS — E441 Mild protein-calorie malnutrition: Secondary | ICD-10-CM | POA: Diagnosis not present

## 2020-08-02 DIAGNOSIS — N183 Chronic kidney disease, stage 3 unspecified: Secondary | ICD-10-CM | POA: Diagnosis not present

## 2020-08-02 DIAGNOSIS — Z8546 Personal history of malignant neoplasm of prostate: Secondary | ICD-10-CM | POA: Diagnosis not present

## 2020-08-02 DIAGNOSIS — N1831 Chronic kidney disease, stage 3a: Secondary | ICD-10-CM | POA: Diagnosis not present

## 2020-08-02 DIAGNOSIS — R339 Retention of urine, unspecified: Secondary | ICD-10-CM | POA: Diagnosis not present

## 2020-08-02 DIAGNOSIS — I739 Peripheral vascular disease, unspecified: Secondary | ICD-10-CM | POA: Diagnosis not present

## 2020-08-02 DIAGNOSIS — I1310 Hypertensive heart and chronic kidney disease without heart failure, with stage 1 through stage 4 chronic kidney disease, or unspecified chronic kidney disease: Secondary | ICD-10-CM | POA: Diagnosis not present

## 2020-08-02 DIAGNOSIS — R413 Other amnesia: Secondary | ICD-10-CM | POA: Diagnosis not present

## 2020-08-02 DIAGNOSIS — M1990 Unspecified osteoarthritis, unspecified site: Secondary | ICD-10-CM | POA: Diagnosis not present

## 2020-08-02 DIAGNOSIS — D6869 Other thrombophilia: Secondary | ICD-10-CM | POA: Diagnosis not present

## 2020-08-02 DIAGNOSIS — I2699 Other pulmonary embolism without acute cor pulmonale: Secondary | ICD-10-CM | POA: Diagnosis not present

## 2020-08-09 ENCOUNTER — Encounter: Payer: Self-pay | Admitting: *Deleted

## 2020-08-09 ENCOUNTER — Telehealth: Payer: Self-pay | Admitting: *Deleted

## 2020-08-09 DIAGNOSIS — I1310 Hypertensive heart and chronic kidney disease without heart failure, with stage 1 through stage 4 chronic kidney disease, or unspecified chronic kidney disease: Secondary | ICD-10-CM | POA: Diagnosis not present

## 2020-08-09 DIAGNOSIS — R350 Frequency of micturition: Secondary | ICD-10-CM

## 2020-08-09 DIAGNOSIS — N189 Chronic kidney disease, unspecified: Secondary | ICD-10-CM

## 2020-08-09 DIAGNOSIS — N1831 Chronic kidney disease, stage 3a: Secondary | ICD-10-CM | POA: Diagnosis not present

## 2020-08-09 NOTE — Congregational Nurse Program (Signed)
Pt trying to find urinary leg bags doing provider search for obtaining these for the patient.

## 2020-08-09 NOTE — Telephone Encounter (Signed)
Need urinary leg bag for travel and unable to find, searching providers for possible leg bags will call Cordarri[facility] back.

## 2020-08-11 DIAGNOSIS — R338 Other retention of urine: Secondary | ICD-10-CM | POA: Diagnosis not present

## 2020-08-31 DIAGNOSIS — X32XXXD Exposure to sunlight, subsequent encounter: Secondary | ICD-10-CM | POA: Diagnosis not present

## 2020-08-31 DIAGNOSIS — B9689 Other specified bacterial agents as the cause of diseases classified elsewhere: Secondary | ICD-10-CM | POA: Diagnosis not present

## 2020-08-31 DIAGNOSIS — L570 Actinic keratosis: Secondary | ICD-10-CM | POA: Diagnosis not present

## 2020-08-31 DIAGNOSIS — C4441 Basal cell carcinoma of skin of scalp and neck: Secondary | ICD-10-CM | POA: Diagnosis not present

## 2020-08-31 DIAGNOSIS — L0202 Furuncle of face: Secondary | ICD-10-CM | POA: Diagnosis not present

## 2020-09-07 ENCOUNTER — Telehealth: Payer: Self-pay | Admitting: *Deleted

## 2020-09-07 DIAGNOSIS — R338 Other retention of urine: Secondary | ICD-10-CM | POA: Diagnosis not present

## 2020-09-07 NOTE — Telephone Encounter (Signed)
[id number]/ needing to find some short urinary leg bags for travel.  Did obtain three leg bags for [name] and delivered them to him.  Next appointment is [id number].  Did encourage the caller to ask his mdo if they any extras that he could have or know a supplier nearby. Yostin Malacara,BSN,RN3,CCM,CN

## 2020-10-05 DIAGNOSIS — R338 Other retention of urine: Secondary | ICD-10-CM | POA: Diagnosis not present

## 2020-10-12 DIAGNOSIS — L570 Actinic keratosis: Secondary | ICD-10-CM | POA: Diagnosis not present

## 2020-10-12 DIAGNOSIS — C44311 Basal cell carcinoma of skin of nose: Secondary | ICD-10-CM | POA: Diagnosis not present

## 2020-10-12 DIAGNOSIS — X32XXXD Exposure to sunlight, subsequent encounter: Secondary | ICD-10-CM | POA: Diagnosis not present

## 2020-11-01 ENCOUNTER — Telehealth: Payer: Self-pay | Admitting: *Deleted

## 2020-11-01 NOTE — Telephone Encounter (Signed)
Patient sustained a fall by trying to catch wife as she fell backwards. Patient did strike a door with his back. No obvious injuries were notes.  Did arise and walk to car. Tct=[id number]/continues to be within his normal limits. Ruhani Umland,BSN,RN3,CCM,CN

## 2020-11-02 ENCOUNTER — Ambulatory Visit: Payer: Medicare Other | Admitting: Neurology

## 2020-11-08 ENCOUNTER — Encounter (HOSPITAL_COMMUNITY): Payer: Self-pay | Admitting: *Deleted

## 2020-11-08 ENCOUNTER — Telehealth: Payer: Self-pay | Admitting: *Deleted

## 2020-11-08 ENCOUNTER — Emergency Department (HOSPITAL_COMMUNITY)
Admission: EM | Admit: 2020-11-08 | Discharge: 2020-11-08 | Disposition: A | Discharge status: Home / Self Care | Payer: Medicare Other | Attending: Emergency Medicine | Admitting: Emergency Medicine

## 2020-11-08 ENCOUNTER — Emergency Department (HOSPITAL_COMMUNITY): Payer: Medicare Other

## 2020-11-08 ENCOUNTER — Other Ambulatory Visit: Payer: Self-pay

## 2020-11-08 DIAGNOSIS — G319 Degenerative disease of nervous system, unspecified: Secondary | ICD-10-CM | POA: Diagnosis not present

## 2020-11-08 DIAGNOSIS — R404 Transient alteration of awareness: Secondary | ICD-10-CM | POA: Diagnosis not present

## 2020-11-08 DIAGNOSIS — J9811 Atelectasis: Secondary | ICD-10-CM | POA: Diagnosis not present

## 2020-11-08 DIAGNOSIS — Z8546 Personal history of malignant neoplasm of prostate: Secondary | ICD-10-CM | POA: Diagnosis not present

## 2020-11-08 DIAGNOSIS — Z87891 Personal history of nicotine dependence: Secondary | ICD-10-CM | POA: Insufficient documentation

## 2020-11-08 DIAGNOSIS — N3001 Acute cystitis with hematuria: Secondary | ICD-10-CM | POA: Insufficient documentation

## 2020-11-08 DIAGNOSIS — Z20822 Contact with and (suspected) exposure to covid-19: Secondary | ICD-10-CM | POA: Diagnosis not present

## 2020-11-08 DIAGNOSIS — Z85038 Personal history of other malignant neoplasm of large intestine: Secondary | ICD-10-CM | POA: Insufficient documentation

## 2020-11-08 DIAGNOSIS — Z79899 Other long term (current) drug therapy: Secondary | ICD-10-CM | POA: Insufficient documentation

## 2020-11-08 DIAGNOSIS — R531 Weakness: Secondary | ICD-10-CM | POA: Diagnosis present

## 2020-11-08 DIAGNOSIS — R4182 Altered mental status, unspecified: Secondary | ICD-10-CM | POA: Insufficient documentation

## 2020-11-08 DIAGNOSIS — R410 Disorientation, unspecified: Secondary | ICD-10-CM | POA: Diagnosis not present

## 2020-11-08 DIAGNOSIS — I10 Essential (primary) hypertension: Secondary | ICD-10-CM | POA: Diagnosis not present

## 2020-11-08 DIAGNOSIS — Z7982 Long term (current) use of aspirin: Secondary | ICD-10-CM | POA: Insufficient documentation

## 2020-11-08 LAB — URINALYSIS, ROUTINE W REFLEX MICROSCOPIC
Bilirubin Urine: NEGATIVE
Glucose, UA: NEGATIVE mg/dL
Ketones, ur: NEGATIVE mg/dL
Nitrite: POSITIVE — AB
Protein, ur: 100 mg/dL — AB
RBC / HPF: >50 RBC/hpf — ABNORMAL HIGH (ref 0–5)
Specific Gravity, Urine: 1.014 (ref 1.005–1.030)
WBC, UA: >50 WBC/hpf — ABNORMAL HIGH (ref 0–5)
pH: 5 (ref 5.0–8.0)

## 2020-11-08 LAB — RESP PANEL BY RT-PCR (FLU A&B, COVID) ARPGX2
Influenza A by PCR: NEGATIVE
Influenza B by PCR: NEGATIVE
SARS Coronavirus 2 by RT PCR: NEGATIVE

## 2020-11-08 LAB — CBG MONITORING, ED: Glucose-Capillary: 102 mg/dL — ABNORMAL HIGH (ref 70–99)

## 2020-11-08 LAB — CBC WITH DIFFERENTIAL/PLATELET
Abs Immature Granulocytes: 0.04 10*3/uL (ref 0.00–0.07)
Basophils Absolute: 0 10*3/uL (ref 0.0–0.1)
Basophils Relative: 0 %
Eosinophils Absolute: 0 10*3/uL (ref 0.0–0.5)
Eosinophils Relative: 0 %
HCT: 31.1 % — ABNORMAL LOW (ref 39.0–52.0)
Hemoglobin: 10.3 g/dL — ABNORMAL LOW (ref 13.0–17.0)
Immature Granulocytes: 0 %
Lymphocytes Relative: 6 %
Lymphs Abs: 0.6 10*3/uL — ABNORMAL LOW (ref 0.7–4.0)
MCH: 30.3 pg (ref 26.0–34.0)
MCHC: 33.1 g/dL (ref 30.0–36.0)
MCV: 91.5 fL (ref 80.0–100.0)
Monocytes Absolute: 0.8 10*3/uL (ref 0.1–1.0)
Monocytes Relative: 8 %
Neutro Abs: 9.2 10*3/uL — ABNORMAL HIGH (ref 1.7–7.7)
Neutrophils Relative %: 86 %
Platelets: 212 10*3/uL (ref 150–400)
RBC: 3.4 MIL/uL — ABNORMAL LOW (ref 4.22–5.81)
RDW: 15.2 % (ref 11.5–15.5)
WBC: 10.7 10*3/uL — ABNORMAL HIGH (ref 4.0–10.5)
nRBC: 0 % (ref 0.0–0.2)

## 2020-11-08 LAB — COMPREHENSIVE METABOLIC PANEL
ALT: 14 U/L (ref 0–44)
AST: 16 U/L (ref 15–41)
Albumin: 3.4 g/dL — ABNORMAL LOW (ref 3.5–5.0)
Alkaline Phosphatase: 67 U/L (ref 38–126)
Anion gap: 12 (ref 5–15)
BUN: 35 mg/dL — ABNORMAL HIGH (ref 8–23)
CO2: 25 mmol/L (ref 22–32)
Calcium: 9.8 mg/dL (ref 8.9–10.3)
Chloride: 101 mmol/L (ref 98–111)
Creatinine, Ser: 2.04 mg/dL — ABNORMAL HIGH (ref 0.61–1.24)
GFR, Estimated: 31 mL/min — ABNORMAL LOW (ref >60)
Glucose, Bld: 123 mg/dL — ABNORMAL HIGH (ref 70–99)
Potassium: 3.7 mmol/L (ref 3.5–5.1)
Sodium: 138 mmol/L (ref 135–145)
Total Bilirubin: 0.6 mg/dL (ref 0.3–1.2)
Total Protein: 7.1 g/dL (ref 6.5–8.1)

## 2020-11-08 LAB — ETHANOL: Alcohol, Ethyl (B): <10 mg/dL (ref <10)

## 2020-11-08 LAB — TSH: TSH: 3.436 u[IU]/mL (ref 0.350–4.500)

## 2020-11-08 LAB — T4, FREE: Free T4: 0.9 ng/dL (ref 0.61–1.12)

## 2020-11-08 MED ORDER — CEPHALEXIN 500 MG PO CAPS
1000.0000 mg | ORAL_CAPSULE | Freq: Once | ORAL | Status: AC
  Administered 2020-11-08: 1000 mg via ORAL
  Filled 2020-11-08: qty 2

## 2020-11-08 MED ORDER — SODIUM CHLORIDE 0.9 % IV BOLUS
1000.0000 mL | Freq: Once | INTRAVENOUS | Status: AC
  Administered 2020-11-08: 1000 mL via INTRAVENOUS

## 2020-11-08 MED ORDER — CEPHALEXIN 500 MG PO CAPS: 500.0000 mg | ORAL_CAPSULE | Freq: Four times a day (QID) | ORAL | 0 refills | Status: DC

## 2020-11-08 NOTE — ED Provider Notes (Signed)
[name] DEPT Provider Note   CSN: [id number] Arrival date & time: 11/08/20  0855     History Chief Complaint  Patient presents with  . Urinary Tract Infection    [name] is a 87 y.o. male.  87 yo M with a cc of AMS and weakness.  Going on for about 48 hours.  Chills.  No other noted symptoms.  Having confusion.  Dark urine.    The history is provided by the patient.  Urinary Tract Infection Associated symptoms: no abdominal pain, no diarrhea, no fever and no vomiting   Illness Severity:  Moderate Onset quality:  Gradual Duration:  2 days Timing:  Constant Progression:  Partially resolved Chronicity:  New Associated symptoms: no abdominal pain, no chest pain, no congestion, no diarrhea, no fever, no headaches, no myalgias, no rash, no shortness of breath and no vomiting        Past Medical History:  Diagnosis Date  . Adenocarcinoma of colon ([place])   . Arthritis    gout  . Diverticulitis   . Gout    no recent flare  . Hemorrhoids   . History of blood transfusion   . Hypertension   . Incontinence   . Peripheral vascular disease ([place]) 2011   DVT right leg  . Prostate carcinoma ([place])   . Wears dentures    upper   . Wears glasses     Patient Active Problem List   Diagnosis Date Noted  .  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 Colon cancer ([place]) 12/15/2013  . Acute lower GI bleeding 11/14/2013  . History of pulmonary embolism 11/14/2013  . LOCALIZED SUPERFICIAL SWELLING MASS OR LUMP 09/30/2010  . DIVERTICULAR DISEASE 06/21/2009  . ACNE ROSACEA 05/12/2009  . ARTHRALGIA 05/12/2009  . Gout, unspecified 05/11/2008  . HYPERTENSION 05/11/2008  . KIDNEY DISEASE, CHRONIC NOS 05/11/2008  . COUGH 04/17/2007  . ESOPHAGEAL STRICTURE 01/23/2007  . ADENOCARCINOMA, PROSTATE, HX OF 01/23/2007    Past Surgical History:  Procedure Laterality Date  . COLON SURGERY    . COLONOSCOPY N/A 11/17/2013    Procedure: COLONOSCOPY;  Surgeon: Jeryl Columbia, MD;  [facility];  Service: Endoscopy;  Laterality: N/A;  . CYSTOSCOPY W/ RETROGRADES Bilateral 08/16/2018   Procedure: CYSTOSCOPY WITH BILATERAL  RETROGRADE PYELOGRAM;  Surgeon: Lucas Mallow, MD;  [facility];  Service: Urology;  Laterality: Bilateral;  . diverticulitis    . EYE SURGERY Bilateral    cataract extraction with IOL  . HERNIA REPAIR  1999   inguinal  . HOT HEMOSTASIS N/A 11/17/2013   Procedure: HOT HEMOSTASIS (ARGON PLASMA COAGULATION/BICAP);  Surgeon: Jeryl Columbia, MD;  Location: [facility];  Service: Endoscopy;  Laterality: N/A;  . ILEOSTOMY    . LAPAROSCOPIC LYSIS OF ADHESIONS N/A 01/06/2014   Procedure: LAPAROSCOPIC LYSIS OF ADHESIONS;  Surgeon: Odis Hollingshead, MD;  [facility];  Service: General;  Laterality: N/A;  . LAPAROSCOPIC PARTIAL COLECTOMY N/A 01/06/2014   Procedure: LAPAROSCOPIC ASSISTED PARTIAL COLECTOMY;  Surgeon: Odis Hollingshead, MD;  Location[facility];  Service: General;  Laterality: N/A;  . [place]  . TONSILLECTOMY    . TRANSURETHRAL RESECTION OF BLADDER TUMOR N/A 08/16/2018   Procedure: TRANSURETHRAL RESECTION OF BLADDER TUMOR (TURBT);  Surgeon: Lucas Mallow, MD;  [facility];  Service: Urology;  Laterality: N/A;  . TRANSURETHRAL RESECTION OF BLADDER TUMOR WITH MITOMYCIN-C N/A 02/03/2019   Procedure:  TRANSURETHRAL RESECTION OF BLADDER TUMOR WITH GEMCITABINE;  Surgeon: Lucas Mallow, MD;  [facility];  Service: Urology;  Laterality: N/A;       No family history on file.  Social History   Tobacco Use  . Smoking status: Former Smoker    Types: Cigarettes, Pipe    Quit date: 01/02/1980    Years since quitting: 40.8  . Smokeless tobacco: Never Used  Vaping Use  . Vaping Use: Never used  Substance Use Topics  . Alcohol use: No  . Drug use: No    Home Medications Prior to Admission medications   Medication Sig Start Date End Date Taking?  Authorizing Provider  cephALEXin (KEFLEX) 500 MG capsule Take 1 capsule (500 mg total) by mouth 4 (four) times daily. 11/08/20  Yes Deno Etienne, DO  acetaminophen (TYLENOL) 500 MG tablet Take 1,000 mg by mouth every 8 (eight) hours as needed for mild pain.    [provider]  allopurinol (ZYLOPRIM) 300 MG tablet Take 300 mg by mouth every evening.     [provider]  aspirin EC 81 MG tablet Take 81 mg by mouth daily.    [provider]  carvedilol (COREG) 6.25 MG tablet Take 6.25 mg by mouth 2 (two) times daily with a meal.      [provider]  hydrochlorothiazide (HYDRODIURIL) 50 MG tablet Take 50 mg by mouth every evening.     [provider]  Multiple Vitamin (MULTIVITAMIN WITH MINERALS) TABS tablet Take 1 tablet by mouth every evening.     [provider]  potassium chloride SA (K-DUR,KLOR-CON) 20 MEQ tablet Take 20 mEq by mouth every evening.  07/13/18   [provider]    Allergies    Sulfa antibiotics and Sulfonamide derivatives  Review of Systems   Review of Systems  Unable to perform ROS: Mental status change  Constitutional: Negative for chills and fever.  HENT: Negative for congestion and facial swelling.   Eyes: Negative for discharge and visual disturbance.  Respiratory: Negative for shortness of breath.   Cardiovascular: Negative for chest pain and palpitations.  Gastrointestinal: Negative for abdominal pain, diarrhea and vomiting.  Musculoskeletal: Negative for arthralgias and myalgias.  Skin: Negative for color change and rash.  Neurological: Negative for tremors, syncope and headaches.  Psychiatric/Behavioral: Negative for confusion and dysphoric mood.    Physical Exam Updated Vital Signs BP 118/82   Pulse 72   Temp 97.8 F (36.6 C) (Oral)   Resp 20   Wt 74.8 kg   SpO2 97%   BMI 23.68 kg/m   Physical Exam Vitals and nursing note reviewed.  Constitutional:      Appearance: He is well-developed  and well-nourished.  HENT:     Head: Normocephalic and atraumatic.  Eyes:     Extraocular Movements: EOM normal.     Pupils: Pupils are equal, round, and reactive to light.  Neck:     Vascular: No JVD.  Cardiovascular:     Rate and Rhythm: Normal rate and regular rhythm.     Heart sounds: No murmur heard. No friction rub. No gallop.   Pulmonary:     Effort: No respiratory distress.     Breath sounds: No wheezing.  Abdominal:     General: There is no distension.     Tenderness: There is no guarding or rebound.  Musculoskeletal:        General: Normal range of motion.     Cervical back: Normal range  of motion and neck supple.  Skin:    Coloration: Skin is not pale.     Findings: No rash.  Neurological:     Mental Status: He is alert and oriented to person, place, and time.  Psychiatric:        Mood and Affect: Mood and affect normal.        Behavior: Behavior normal.     ED Results / Procedures / Treatments   Labs (all labs ordered are listed, but only abnormal results are displayed) Labs Reviewed  CBC WITH DIFFERENTIAL/PLATELET - Abnormal; Notable for the following components:      Result Value   WBC 10.7 (*)    RBC 3.40 (*)    Hemoglobin 10.3 (*)    HCT 31.1 (*)    Neutro Abs 9.2 (*)    Lymphs Abs 0.6 (*)    All other components within normal limits  COMPREHENSIVE METABOLIC PANEL - Abnormal; Notable for the following components:   Glucose, Bld 123 (*)    BUN 35 (*)    Creatinine, Ser 2.04 (*)    Albumin 3.4 (*)    GFR, Estimated 31 (*)    All other components within normal limits  URINALYSIS, ROUTINE W REFLEX MICROSCOPIC - Abnormal; Notable for the following components:   APPearance CLOUDY (*)    Hgb urine dipstick LARGE (*)    Protein, ur 100 (*)    Nitrite POSITIVE (*)    Leukocytes,Ua LARGE (*)    RBC / HPF >50 (*)    WBC, UA >50 (*)    Bacteria, UA FEW (*)    All other components within normal limits  CBG MONITORING, ED - Abnormal; Notable for the  following components:   Glucose-Capillary 102 (*)    All other components within normal limits  RESP PANEL BY RT-PCR (FLU A&B, COVID) ARPGX2  URINE CULTURE  ETHANOL  TSH  T4, FREE    EKG EKG Interpretation  Date/Time:  Monday November 08 2020 10:20:56 EST Ventricular Rate:  85 PR Interval:    QRS Duration: 122 QT Interval:  377 QTC Calculation: 449 R Axis:   -66 Text Interpretation: Sinus rhythm Ventricular premature complex RBBB and LAFB 12 Lead; [place] No significant change since last tracing Confirmed by Deno Etienne [phone]) on 11/08/2020 12:28:36 PM   Radiology CT Head Wo Contrast  Result Date: 11/08/2020 CLINICAL DATA:  Delirium. EXAM: CT HEAD WITHOUT CONTRAST TECHNIQUE: Contiguous axial images were obtained from the base of the skull through the vertex without intravenous contrast. COMPARISON:  None. FINDINGS: Brain: Mild diffuse cortical atrophy is noted. Left temporal encephalomalacia is noted consistent with old infarction. No mass effect or midline shift is noted. Ventricular size is within normal limits. There is no evidence of mass lesion, hemorrhage or acute infarction. Vascular: No hyperdense vessel or unexpected calcification. Skull: Normal. Negative for fracture or focal lesion. Sinuses/Orbits: No acute finding. Other: None. IMPRESSION: Mild diffuse cortical atrophy. Left temporal encephalomalacia consistent with old infarction. No acute intracranial abnormality seen. Electronically Signed   By: Marijo Conception M.D.   On: 11/08/2020 10:19   DG Chest Port 1 View  Result Date: 11/08/2020 CLINICAL DATA:  Altered mental status EXAM: PORTABLE CHEST 1 VIEW COMPARISON:  January 03, 2011 FINDINGS: There is mild bibasilar atelectasis. There is no edema or airspace opacity. Heart is upper normal in size with pulmonary vascularity normal. No adenopathy. Aorta is somewhat prominent and tortuous but stable. No adenopathy. No bone lesions. IMPRESSION: Mild bibasilar  atelectasis. No  edema or airspace opacity. Stable cardiac silhouette. Aortic prominence is likely due to chronic hypertensive change. Electronically Signed   By: Lowella Grip III M.D.   On: 11/08/2020 09:53    Procedures Procedures (including critical care time)  Medications Ordered in ED Medications  sodium chloride 0.9 % bolus 1,000 mL (0 mLs Intravenous Stopped 11/08/20 1251)  cephALEXin (KEFLEX) capsule 1,000 mg (1,000 mg Oral Given 11/08/20 1357)    ED Course  I have reviewed the triage vital signs and the nursing notes.  Pertinent labs & imaging results that were available during my care of the patient were reviewed by me and considered in my medical decision making (see chart for details).    MDM Rules/Calculators/A&P                          87 yo M with a cc of AMS.  Going on for 48 hours.  Actually improved this morning, but sent via EMS here. Patient without complaint.  Denies pain.  Clear lungs.  Case discussed with wife, will obtain AMS workup.    Patient urine does appear to be infected.  Positive nitrates and leukocyte esterase and too numerous to count bacteria.  Will start on antibiotics.  Sent for culture.  PCP follow-up.  2:06 PM:  I have discussed the diagnosis/risks/treatment options with the patient and family and believe the pt to be eligible for discharge home to follow-up with PCP. We also discussed returning to the ED immediately if new or worsening sx occur. We discussed the sx which are most concerning (e.g., sudden worsening pain, fever, inability to tolerate by mouth) that necessitate immediate return. Medications administered to the patient during their visit and any new prescriptions provided to the patient are listed below.  Medications given during this visit Medications  sodium chloride 0.9 % bolus 1,000 mL (0 mLs Intravenous Stopped 11/08/20 1251)  cephALEXin (KEFLEX) capsule 1,000 mg (1,000 mg Oral Given 11/08/20 1357)     The patient appears reasonably screen  and/or stabilized for discharge and I doubt any other medical condition or other [facility] requiring further screening, evaluation, or treatment in the ED at this time prior to discharge.    Final Clinical Impression(s) / ED Diagnoses Final diagnoses:  Acute cystitis with hematuria    Rx / DC Orders ED Discharge Orders         Ordered    cephALEXin (KEFLEX) 500 MG capsule  4 times daily        11/08/20 [place], Billy Turvey, DO 11/08/20 1406

## 2020-11-08 NOTE — Discharge Instructions (Signed)
Follow up with your family doc in the office.  

## 2020-11-08 NOTE — ED Triage Notes (Signed)
BIB EMS with UTI symptoms since Friday. #20 L AC, 100/62-88-97% CBG 239

## 2020-11-08 NOTE — Telephone Encounter (Signed)
[name] this am, has not felt good over the last several days, called ems today after blackout .  [name] is now in the ed.  Is able to clearly tell me what happened and thinks he has another uti.

## 2020-11-11 LAB — URINE CULTURE: Culture: >=100000 — AB

## 2020-11-12 ENCOUNTER — Telehealth: Payer: Self-pay | Admitting: Emergency Medicine

## 2020-11-12 NOTE — Telephone Encounter (Signed)
Post ED Visit - Positive Culture Follow-up  Culture report reviewed by antimicrobial stewardship pharmacist: [facility] Team []  Elenor Quinones, Pharm.D. []  Heide Guile, Pharm.D., BCPS AQ-ID []  Parks Neptune, Pharm.D., BCPS []  Alycia Rossetti, Pharm.D., BCPS []  [place], Pharm.D., BCPS, AAHIVP []  Legrand Como, Pharm.D., BCPS, AAHIVP []  Salome Arnt, PharmD, BCPS []  Johnnette Gourd, PharmD, BCPS []  Hughes Better, PharmD, BCPS []  Leeroy Cha, PharmD []  Laqueta Linden, PharmD, BCPS []  Albertina Parr, PharmD  [place] Team []  Leodis Sias, PharmD []  Lindell Spar, PharmD []  Royetta Asal, PharmD []  Graylin Shiver, Rph []  Rema Fendt) Glennon Mac, PharmD []  Arlyn Dunning, PharmD []  Netta Cedars, PharmD []  Dia Sitter, PharmD []  Leone Haven, PharmD []  Gretta Arab, PharmD []  Theodis Shove, PharmD []  Peggyann Juba, PharmD []  Reuel Boom, PharmD Jimmy Footman PharmD   Positive urine culture Treated with cephalexin, organism sensitive to the same and no further patient follow-up is required at this time.  Hazle Nordmann 11/12/2020, 10:03 AM

## 2020-11-23 DIAGNOSIS — C44319 Basal cell carcinoma of skin of other parts of face: Secondary | ICD-10-CM | POA: Diagnosis not present

## 2020-11-23 DIAGNOSIS — B078 Other viral warts: Secondary | ICD-10-CM | POA: Diagnosis not present

## 2020-12-01 DIAGNOSIS — R339 Retention of urine, unspecified: Secondary | ICD-10-CM | POA: Diagnosis not present

## 2020-12-20 ENCOUNTER — Encounter: Payer: Self-pay | Admitting: *Deleted

## 2020-12-20 DIAGNOSIS — R509 Fever, unspecified: Secondary | ICD-10-CM | POA: Diagnosis not present

## 2020-12-20 DIAGNOSIS — R309 Painful micturition, unspecified: Secondary | ICD-10-CM | POA: Diagnosis not present

## 2020-12-21 ENCOUNTER — Ambulatory Visit: Payer: Medicare Other | Admitting: Neurology

## 2020-12-29 DIAGNOSIS — R338 Other retention of urine: Secondary | ICD-10-CM | POA: Diagnosis not present

## 2021-01-02 ENCOUNTER — Encounter (HOSPITAL_COMMUNITY): Payer: Self-pay

## 2021-01-02 ENCOUNTER — Emergency Department (HOSPITAL_COMMUNITY)
Admission: EM | Admit: 2021-01-02 | Discharge: 2021-01-02 | Disposition: A | Discharge status: Home / Self Care | Payer: Medicare Other | Attending: Emergency Medicine | Admitting: Emergency Medicine

## 2021-01-02 DIAGNOSIS — Z85038 Personal history of other malignant neoplasm of large intestine: Secondary | ICD-10-CM | POA: Diagnosis not present

## 2021-01-02 DIAGNOSIS — Z8546 Personal history of malignant neoplasm of prostate: Secondary | ICD-10-CM | POA: Diagnosis not present

## 2021-01-02 DIAGNOSIS — Z87891 Personal history of nicotine dependence: Secondary | ICD-10-CM | POA: Diagnosis not present

## 2021-01-02 DIAGNOSIS — N183 Chronic kidney disease, stage 3 unspecified: Secondary | ICD-10-CM | POA: Diagnosis not present

## 2021-01-02 DIAGNOSIS — I129 Hypertensive chronic kidney disease with stage 1 through stage 4 chronic kidney disease, or unspecified chronic kidney disease: Secondary | ICD-10-CM | POA: Insufficient documentation

## 2021-01-02 DIAGNOSIS — T83098A Other mechanical complication of other indwelling urethral catheter, initial encounter: Secondary | ICD-10-CM | POA: Diagnosis not present

## 2021-01-02 DIAGNOSIS — T83031A Leakage of indwelling urethral catheter, initial encounter: Secondary | ICD-10-CM | POA: Diagnosis present

## 2021-01-02 DIAGNOSIS — Z789 Other specified health status: Secondary | ICD-10-CM

## 2021-01-02 NOTE — ED Provider Notes (Signed)
Emergency Department Provider Note   I have reviewed the triage vital signs and the nursing notes.   HISTORY  Chief Complaint Urinary catheter problem   HPI [name] is a 87 y.o. male with past medical history reviewed below presents to the emergency department with leaking from his Foley catheter leg bag.  He woke up with a puddle this morning and saw some leaking from his leg bag. He is here requesting a bag change. Denies foley pain, back/flank pain, fever.   Past Medical History:  Diagnosis Date  . Adenocarcinoma of colon ([place])   . Arthritis    gout  . CKD (chronic kidney disease), stage III ([place])   . Dermatitis   . Diverticulitis   . Gout    no recent flare  . Hemorrhoids   . History of blood transfusion   . Hypertension   . Incontinence   . Peripheral vascular disease ([place]) 2011   DVT right leg  . Prostate carcinoma ([place])   . Pulmonary embolus ([facility])    s/p hernia surgery 2012  . Rosacea   . Sciatica   . Urinary retention    indwelling catheter  . Wears dentures    upper   . Wears glasses     Patient Active Problem List   Diagnosis Date Noted  .  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 Colon cancer ([place]) 12/15/2013  . Acute lower GI bleeding 11/14/2013  . History of pulmonary embolism 11/14/2013  . LOCALIZED SUPERFICIAL SWELLING MASS OR LUMP 09/30/2010  . DIVERTICULAR DISEASE 06/21/2009  . ACNE ROSACEA 05/12/2009  . ARTHRALGIA 05/12/2009  . Gout, unspecified 05/11/2008  . HYPERTENSION 05/11/2008  . KIDNEY DISEASE, CHRONIC NOS 05/11/2008  . COUGH 04/17/2007  . ESOPHAGEAL STRICTURE 01/23/2007  . ADENOCARCINOMA, PROSTATE, HX OF 01/23/2007    Past Surgical History:  Procedure Laterality Date  . COLON SURGERY    . COLONOSCOPY N/A 11/17/2013   Procedure: COLONOSCOPY;  Surgeon: Jeryl Columbia, MD;  Location: [facility];  Service: Endoscopy;  Laterality: N/A;  . cyst removal L neck/shoulder    . CYSTOSCOPY W/  RETROGRADES Bilateral 08/16/2018   Procedure: CYSTOSCOPY WITH BILATERAL  RETROGRADE PYELOGRAM;  Surgeon: Lucas Mallow, MD;  Location: [facility];  Service: Urology;  Laterality: Bilateral;  . diverticulitis    . EYE SURGERY Bilateral    cataract extraction with IOL  . hemorrhoids   1961, 1971, 1991  . HERNIA REPAIR  1999   inguinal  . HOT HEMOSTASIS N/A 11/17/2013   Procedure: HOT HEMOSTASIS (ARGON PLASMA COAGULATION/BICAP);  Surgeon: Jeryl Columbia, MD;  Location: [facility];  Service: Endoscopy;  Laterality: N/A;  . ILEOSTOMY    . LAPAROSCOPIC LYSIS OF ADHESIONS N/A 01/06/2014   Procedure: LAPAROSCOPIC LYSIS OF ADHESIONS;  Surgeon: Odis Hollingshead, MD;  Location: [facility];  Service: General;  Laterality: N/A;  . LAPAROSCOPIC PARTIAL COLECTOMY N/A 01/06/2014   Procedure: LAPAROSCOPIC ASSISTED PARTIAL COLECTOMY;  Surgeon: Odis Hollingshead, MD;  Location: [facility];  Service: General;  Laterality: N/A;  . [place]  . SHOULDER SURGERY     L bursa shoulder removal 2012   . TONSILLECTOMY    . TRANSURETHRAL RESECTION OF BLADDER TUMOR N/A 08/16/2018   Procedure: TRANSURETHRAL RESECTION OF BLADDER TUMOR (TURBT);  Surgeon: Lucas Mallow, MD;  [facility];  Service: Urology;  Laterality: N/A;  . TRANSURETHRAL RESECTION OF BLADDER TUMOR WITH MITOMYCIN-C N/A 02/03/2019  Procedure: TRANSURETHRAL RESECTION OF BLADDER TUMOR WITH GEMCITABINE;  Surgeon: Lucas Mallow, MD;  [facility];  Service: Urology;  Laterality: N/A;  . upper teeth removal  2018    Allergies Sulfa antibiotics and Sulfonamide derivatives  Family History  Problem Relation Age of Onset  . Prostate cancer Father     Social History Social History   Tobacco Use  . Smoking status: Former Smoker    Types: Cigarettes, Pipe    Quit date: 01/02/1980    Years since quitting: 41.0  . Smokeless tobacco: Never Used  Vaping Use  . Vaping Use: Never used  Substance Use Topics  . Alcohol use: No  .  Drug use: No    Review of Systems  Constitutional: No fever/chills Gastrointestinal: No abdominal pain.   Genitourinary: Negative for dysuria. Positive leaking from foley bag.  Musculoskeletal: Negative for back pain.   ____________________________________________   PHYSICAL EXAM:  VITAL SIGNS: ED Triage Vitals  Enc Vitals Group     BP 01/02/21 0914 (!) 141/71     Pulse Rate 01/02/21 0914 81     Resp 01/02/21 0914 16     Temp 01/02/21 0914 (!) 97.5 F (36.4 C)     Temp Source 01/02/21 0914 Oral     SpO2 01/02/21 0914 100 %   Constitutional: Alert and oriented. Well appearing and in no acute distress. Eyes: Conjunctivae are normal.  Head: Atraumatic. Nose: No congestion/rhinnorhea. Mouth/Throat: Mucous membranes are moist. Neck: No stridor.   Cardiovascular: Good peripheral circulation. Respiratory: Normal respiratory effort.  Gastrointestinal: No distention.  Genitourinary: Pale yellow urine w/o gross blood or sediment in leg bag.  Musculoskeletal: No gross deformities of extremities. Neurologic:  Normal speech and language.  Skin:  Skin is warm, dry and intact. No rash noted.  ____________________________________________   PROCEDURES  Procedure(s) performed:   Procedures  None  ____________________________________________   INITIAL IMPRESSION / ASSESSMENT AND PLAN / ED COURSE  Pertinent labs & imaging results that were available during my care of the patient were reviewed by me and considered in my medical decision making (see chart for details).   Patient presents to the ED with leaking foley catheter bag. Urine is normal in appearance. Patient is not having symptoms to suspect urinary obstruction, hematuria, or UTI. Urine continues to drain normally from bag. No leakage around the urethra. Plan for bag change and Urology/PCP follow up. With no symptoms will not obtain UA.   ___________________________________________  FINAL CLINICAL IMPRESSION(S) / ED  DIAGNOSES  Final diagnoses:  Difficulty managing urinary catheter    Note:  This document was prepared using Dragon voice recognition software and may include unintentional dictation errors.  Nanda Quinton, MD, [facility]    Ranyia Witting, [facility], MD 01/02/21 [phone]

## 2021-01-02 NOTE — ED Notes (Signed)
Pt states his leg bag ripped during the night is leaking. Catheter in tact and no issues w catheter. New leg bag applied, pt very familiar with tightening straps and using leg bag, no questions, pt is ready for dc.

## 2021-01-02 NOTE — Discharge Instructions (Signed)
Please follow as scheduled with PCP and Urology.

## 2021-01-02 NOTE — ED Triage Notes (Signed)
Pt presents with c/o urinary catheter issues. Pt has a catheter in place but reported that the bag broke in the middle of the night. Pt reports he is here to have the bigger bag changed out for a smaller bag. Pt denies any pain.

## 2021-01-25 DIAGNOSIS — N1832 Chronic kidney disease, stage 3b: Secondary | ICD-10-CM | POA: Diagnosis not present

## 2021-01-25 DIAGNOSIS — Z125 Encounter for screening for malignant neoplasm of prostate: Secondary | ICD-10-CM | POA: Diagnosis not present

## 2021-01-25 DIAGNOSIS — M109 Gout, unspecified: Secondary | ICD-10-CM | POA: Diagnosis not present

## 2021-01-25 DIAGNOSIS — I1310 Hypertensive heart and chronic kidney disease without heart failure, with stage 1 through stage 4 chronic kidney disease, or unspecified chronic kidney disease: Secondary | ICD-10-CM | POA: Diagnosis not present

## 2021-01-26 DIAGNOSIS — R32 Unspecified urinary incontinence: Secondary | ICD-10-CM | POA: Diagnosis not present

## 2021-02-01 DIAGNOSIS — E441 Mild protein-calorie malnutrition: Secondary | ICD-10-CM | POA: Diagnosis not present

## 2021-02-01 DIAGNOSIS — D649 Anemia, unspecified: Secondary | ICD-10-CM | POA: Diagnosis not present

## 2021-02-01 DIAGNOSIS — R82998 Other abnormal findings in urine: Secondary | ICD-10-CM | POA: Diagnosis not present

## 2021-02-01 DIAGNOSIS — Z1339 Encounter for screening examination for other mental health and behavioral disorders: Secondary | ICD-10-CM | POA: Diagnosis not present

## 2021-02-01 DIAGNOSIS — I2699 Other pulmonary embolism without acute cor pulmonale: Secondary | ICD-10-CM | POA: Diagnosis not present

## 2021-02-01 DIAGNOSIS — Z1331 Encounter for screening for depression: Secondary | ICD-10-CM | POA: Diagnosis not present

## 2021-02-01 DIAGNOSIS — I1310 Hypertensive heart and chronic kidney disease without heart failure, with stage 1 through stage 4 chronic kidney disease, or unspecified chronic kidney disease: Secondary | ICD-10-CM | POA: Diagnosis not present

## 2021-02-01 DIAGNOSIS — N1832 Chronic kidney disease, stage 3b: Secondary | ICD-10-CM | POA: Diagnosis not present

## 2021-02-01 DIAGNOSIS — D6869 Other thrombophilia: Secondary | ICD-10-CM | POA: Diagnosis not present

## 2021-02-01 DIAGNOSIS — Z0000 Encounter for general adult medical examination without abnormal findings: Secondary | ICD-10-CM | POA: Diagnosis not present

## 2021-02-01 DIAGNOSIS — Z8546 Personal history of malignant neoplasm of prostate: Secondary | ICD-10-CM | POA: Diagnosis not present

## 2021-02-01 DIAGNOSIS — I739 Peripheral vascular disease, unspecified: Secondary | ICD-10-CM | POA: Diagnosis not present

## 2021-02-01 DIAGNOSIS — R413 Other amnesia: Secondary | ICD-10-CM | POA: Diagnosis not present

## 2021-02-01 DIAGNOSIS — M109 Gout, unspecified: Secondary | ICD-10-CM | POA: Diagnosis not present

## 2021-02-01 DIAGNOSIS — N1831 Chronic kidney disease, stage 3a: Secondary | ICD-10-CM | POA: Diagnosis not present

## 2021-02-01 DIAGNOSIS — R809 Proteinuria, unspecified: Secondary | ICD-10-CM | POA: Diagnosis not present

## 2021-02-20 ENCOUNTER — Other Ambulatory Visit: Payer: Self-pay

## 2021-02-20 ENCOUNTER — Emergency Department (HOSPITAL_COMMUNITY)
Admission: EM | Admit: 2021-02-20 | Discharge: 2021-02-20 | Disposition: A | Discharge status: Home / Self Care | Payer: Medicare Other | Attending: Emergency Medicine | Admitting: Emergency Medicine

## 2021-02-20 ENCOUNTER — Encounter (HOSPITAL_COMMUNITY): Payer: Self-pay

## 2021-02-20 DIAGNOSIS — Z87891 Personal history of nicotine dependence: Secondary | ICD-10-CM | POA: Insufficient documentation

## 2021-02-20 DIAGNOSIS — Z79899 Other long term (current) drug therapy: Secondary | ICD-10-CM | POA: Insufficient documentation

## 2021-02-20 DIAGNOSIS — Z85038 Personal history of other malignant neoplasm of large intestine: Secondary | ICD-10-CM | POA: Insufficient documentation

## 2021-02-20 DIAGNOSIS — R197 Diarrhea, unspecified: Secondary | ICD-10-CM | POA: Diagnosis not present

## 2021-02-20 DIAGNOSIS — N390 Urinary tract infection, site not specified: Secondary | ICD-10-CM | POA: Diagnosis not present

## 2021-02-20 DIAGNOSIS — R55 Syncope and collapse: Secondary | ICD-10-CM | POA: Diagnosis not present

## 2021-02-20 DIAGNOSIS — Z7982 Long term (current) use of aspirin: Secondary | ICD-10-CM | POA: Insufficient documentation

## 2021-02-20 DIAGNOSIS — R61 Generalized hyperhidrosis: Secondary | ICD-10-CM | POA: Diagnosis not present

## 2021-02-20 DIAGNOSIS — Z8546 Personal history of malignant neoplasm of prostate: Secondary | ICD-10-CM | POA: Insufficient documentation

## 2021-02-20 DIAGNOSIS — N183 Chronic kidney disease, stage 3 unspecified: Secondary | ICD-10-CM | POA: Diagnosis not present

## 2021-02-20 DIAGNOSIS — I129 Hypertensive chronic kidney disease with stage 1 through stage 4 chronic kidney disease, or unspecified chronic kidney disease: Secondary | ICD-10-CM | POA: Insufficient documentation

## 2021-02-20 DIAGNOSIS — R319 Hematuria, unspecified: Secondary | ICD-10-CM

## 2021-02-20 LAB — CBC WITH DIFFERENTIAL/PLATELET
Abs Immature Granulocytes: 0.02 10*3/uL (ref 0.00–0.07)
Basophils Absolute: 0.1 10*3/uL (ref 0.0–0.1)
Basophils Relative: 1 %
Eosinophils Absolute: 0.3 10*3/uL (ref 0.0–0.5)
Eosinophils Relative: 4 %
HCT: 26.1 % — ABNORMAL LOW (ref 39.0–52.0)
Hemoglobin: 8.3 g/dL — ABNORMAL LOW (ref 13.0–17.0)
Immature Granulocytes: 0 %
Lymphocytes Relative: 10 %
Lymphs Abs: 0.7 10*3/uL (ref 0.7–4.0)
MCH: 29.1 pg (ref 26.0–34.0)
MCHC: 31.8 g/dL (ref 30.0–36.0)
MCV: 91.6 fL (ref 80.0–100.0)
Monocytes Absolute: 0.4 10*3/uL (ref 0.1–1.0)
Monocytes Relative: 5 %
Neutro Abs: 6 10*3/uL (ref 1.7–7.7)
Neutrophils Relative %: 80 %
Platelets: 205 10*3/uL (ref 150–400)
RBC: 2.85 MIL/uL — ABNORMAL LOW (ref 4.22–5.81)
RDW: 17.2 % — ABNORMAL HIGH (ref 11.5–15.5)
WBC: 7.5 10*3/uL (ref 4.0–10.5)
nRBC: 0 % (ref 0.0–0.2)

## 2021-02-20 LAB — BASIC METABOLIC PANEL
Anion gap: 5 (ref 5–15)
BUN: 36 mg/dL — ABNORMAL HIGH (ref 8–23)
CO2: 24 mmol/L (ref 22–32)
Calcium: 9.3 mg/dL (ref 8.9–10.3)
Chloride: 109 mmol/L (ref 98–111)
Creatinine, Ser: 1.86 mg/dL — ABNORMAL HIGH (ref 0.61–1.24)
GFR, Estimated: 35 mL/min — ABNORMAL LOW (ref >60)
Glucose, Bld: 126 mg/dL — ABNORMAL HIGH (ref 70–99)
Potassium: 3.7 mmol/L (ref 3.5–5.1)
Sodium: 138 mmol/L (ref 135–145)

## 2021-02-20 LAB — URINALYSIS, ROUTINE W REFLEX MICROSCOPIC
Bilirubin Urine: NEGATIVE
Glucose, UA: NEGATIVE mg/dL
Ketones, ur: NEGATIVE mg/dL
Nitrite: POSITIVE — AB
Protein, ur: 30 mg/dL — AB
Specific Gravity, Urine: 1.012 (ref 1.005–1.030)
WBC, UA: >50 WBC/hpf — ABNORMAL HIGH (ref 0–5)
pH: 6 (ref 5.0–8.0)

## 2021-02-20 LAB — CBG MONITORING, ED: Glucose-Capillary: 102 mg/dL — ABNORMAL HIGH (ref 70–99)

## 2021-02-20 MED ORDER — CEPHALEXIN 500 MG PO CAPS: 500.0000 mg | ORAL_CAPSULE | Freq: Three times a day (TID) | ORAL | 0 refills | Status: DC

## 2021-02-20 NOTE — ED Provider Notes (Signed)
[facility] DEPT Provider Note   CSN: [id number] Arrival date & time: 02/20/21  1113     History No chief complaint on file.   [name] is a 87 y.o. male.  87 year old male who presents after having near syncopal event.  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  Denies any associated pain in his chest or being short of breath.  States he had limited breakfast this morning.  Has not had any recent history of volume loss.  EMS was called and patient's blood sugar was appropriate but his blood pressure was 90.  He denies any recent changes to his medications.  Was given 400 cc of saline and feels much better at this time.  Denies any focal weakness        Past Medical History:  Diagnosis Date  . Adenocarcinoma of colon ([place])   . Arthritis    gout  . CKD (chronic kidney disease), stage III ([place])   . Dermatitis   . Diverticulitis   . Gout    no recent flare  . Hemorrhoids   . History of blood transfusion   . Hypertension   . Incontinence   . Peripheral vascular disease ([place]) 2011   DVT right leg  . Prostate carcinoma ([place])   . Pulmonary embolus ([facility])    s/p hernia surgery 2012  . Rosacea   . Sciatica   . Urinary retention    indwelling catheter  . Wears dentures    upper   . Wears glasses     Patient Active Problem List   Diagnosis Date Noted  .  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 Colon cancer ([place]) 12/15/2013  . Acute lower GI bleeding 11/14/2013  . History of pulmonary embolism 11/14/2013  . LOCALIZED SUPERFICIAL SWELLING MASS OR LUMP 09/30/2010  . DIVERTICULAR DISEASE 06/21/2009  . ACNE ROSACEA 05/12/2009  . ARTHRALGIA 05/12/2009  . Gout, unspecified 05/11/2008  . HYPERTENSION 05/11/2008  . KIDNEY DISEASE, CHRONIC NOS 05/11/2008  . COUGH 04/17/2007  . ESOPHAGEAL STRICTURE 01/23/2007  . ADENOCARCINOMA, PROSTATE, HX OF 01/23/2007    Past Surgical History:  Procedure  Laterality Date  . COLON SURGERY    . COLONOSCOPY N/A 11/17/2013   Procedure: COLONOSCOPY;  Surgeon: Jeryl Columbia, MD;  [facility];  Service: Endoscopy;  Laterality: N/A;  . cyst removal L neck/shoulder    . CYSTOSCOPY W/ RETROGRADES Bilateral 08/16/2018   Procedure: CYSTOSCOPY WITH BILATERAL  RETROGRADE PYELOGRAM;  Surgeon: Lucas Mallow, MD;  [facility];  Service: Urology;  Laterality: Bilateral;  . diverticulitis    . EYE SURGERY Bilateral    cataract extraction with IOL  . hemorrhoids   1961, 1971, 1991  . HERNIA REPAIR  1999   inguinal  . HOT HEMOSTASIS N/A 11/17/2013   Procedure: HOT HEMOSTASIS (ARGON PLASMA COAGULATION/BICAP);  Surgeon: Jeryl Columbia, MD;  Location: [facility];  Service: Endoscopy;  Laterality: N/A;  . ILEOSTOMY    . LAPAROSCOPIC LYSIS OF ADHESIONS N/A 01/06/2014   Procedure: LAPAROSCOPIC LYSIS OF ADHESIONS;  Surgeon: Odis Hollingshead, MD;  [facility];  Service: General;  Laterality: N/A;  . LAPAROSCOPIC PARTIAL COLECTOMY N/A 01/06/2014   Procedure: LAPAROSCOPIC ASSISTED PARTIAL COLECTOMY;  Surgeon: Odis Hollingshead, MD;  [facility];  Service: General;  Laterality: N/A;  . [place]  . SHOULDER SURGERY     L bursa shoulder removal 2012   .  TONSILLECTOMY    . TRANSURETHRAL RESECTION OF BLADDER TUMOR N/A 08/16/2018   Procedure: TRANSURETHRAL RESECTION OF BLADDER TUMOR (TURBT);  Surgeon: Lucas Mallow, MD;  [facility];  Service: Urology;  Laterality: N/A;  . TRANSURETHRAL RESECTION OF BLADDER TUMOR WITH MITOMYCIN-C N/A 02/03/2019   Procedure: TRANSURETHRAL RESECTION OF BLADDER TUMOR WITH GEMCITABINE;  Surgeon: Lucas Mallow, MD;  [facility];  Service: Urology;  Laterality: N/A;  . upper teeth removal  2018       Family History  Problem Relation Age of Onset  . Prostate cancer Father     Social History   Tobacco Use  . Smoking status: Former Smoker    Types: Cigarettes, Pipe    Quit  date: 01/02/1980    Years since quitting: 41.1  . Smokeless tobacco: Never Used  Vaping Use  . Vaping Use: Never used  Substance Use Topics  . Alcohol use: No  . Drug use: No    Home Medications Prior to Admission medications   Medication Sig Start Date End Date Taking? Authorizing Provider  acetaminophen (TYLENOL) 500 MG tablet Take 1,000 mg by mouth every 8 (eight) hours as needed for mild pain.    [provider]  allopurinol (ZYLOPRIM) 300 MG tablet Take 300 mg by mouth every evening.     [provider]  aspirin EC 81 MG tablet Take 81 mg by mouth daily.    [provider]  carvedilol (COREG) 6.25 MG tablet Take 6.25 mg by mouth 2 (two) times daily with a meal.      [provider]  cephALEXin (KEFLEX) 500 MG capsule Take 1 capsule (500 mg total) by mouth 4 (four) times daily. 11/08/20   Deno Etienne, DO  hydrochlorothiazide (HYDRODIURIL) 50 MG tablet Take 50 mg by mouth every evening.     [provider]  Multiple Vitamin (MULTIVITAMIN WITH MINERALS) TABS tablet Take 1 tablet by mouth every evening.     [provider]  potassium chloride SA (K-DUR,KLOR-CON) 20 MEQ tablet Take 20 mEq by mouth every evening.  07/13/18   [provider]    Allergies    Sulfa antibiotics and Sulfonamide derivatives  Review of Systems   Review of Systems  All other systems reviewed and are negative.   Physical Exam Updated Vital Signs BP 128/75 (BP Location: Right Arm)   Pulse (!) 57   Temp 97.9 F (36.6 C) (Oral)   Resp 14   Physical Exam Vitals and nursing note reviewed.  Constitutional:      General: He is not in acute distress.    Appearance: Normal appearance. He is well-developed. He is not toxic-appearing.  HENT:     Head: Normocephalic and atraumatic.  Eyes:     General: Lids are normal.     Conjunctiva/sclera: Conjunctivae normal.     Pupils: Pupils are equal, round, and reactive to light.  Neck:     Thyroid: No  thyroid mass.     Trachea: No tracheal deviation.  Cardiovascular:     Rate and Rhythm: Normal rate and regular rhythm.     Heart sounds: Normal heart sounds. No murmur heard. No gallop.   Pulmonary:     Effort: Pulmonary effort is normal. No respiratory distress.     Breath sounds: Normal breath sounds. No stridor. No decreased breath sounds, wheezing, rhonchi or rales.  Abdominal:     General: Bowel sounds are normal. There is no distension.  Palpations: Abdomen is soft.     Tenderness: There is no abdominal tenderness. There is no rebound.  Musculoskeletal:        General: No tenderness. Normal range of motion.     Cervical back: Normal range of motion and neck supple.  Skin:    General: Skin is warm and dry.     Findings: No abrasion or rash.  Neurological:     General: No focal deficit present.     Mental Status: He is alert and oriented to person, place, and time.     GCS: GCS eye subscore is 4. GCS verbal subscore is 5. GCS motor subscore is 6.     Cranial Nerves: No cranial nerve deficit.     Sensory: No sensory deficit.  Psychiatric:        Attention and Perception: Attention normal.        Speech: Speech normal.        Behavior: Behavior normal.     ED Results / Procedures / Treatments   Labs (all labs ordered are listed, but only abnormal results are displayed) Labs Reviewed  CBG MONITORING, ED - Abnormal; Notable for the following components:      Result Value   Glucose-Capillary 102 (*)    All other components within normal limits  CBC WITH DIFFERENTIAL/PLATELET  BASIC METABOLIC PANEL    EKG EKG Interpretation  Date/Time:  Sunday Feb 20 2021 11:36:53 EDT Ventricular Rate:  72 PR Interval:  177 QRS Duration: 127 QT Interval:  448 QTC Calculation: 491 R Axis:   -62 Text Interpretation: Sinus rhythm Atrial premature complexes Nonspecific IVCD with LAD Left ventricular hypertrophy Anterior Q waves, possibly due to LVH No significant change since last  tracing Confirmed by Lacretia Leigh ([id number]) on 02/20/2021 11:56:18 AM   Radiology No results found.  Procedures Procedures   Medications Ordered in ED Medications - No data to display  ED Course  I have reviewed the triage vital signs and the nursing notes.  Pertinent labs & imaging results that were available during my care of the patient were reviewed by me and considered in my medical decision making (see chart for details).    MDM Rules/Calculators/A&P                          Patient is now orthostatic here.  He denies being dizzy.  His CBC does show slightly decrease in his hemoglobin.  He has had no lower GI bleeding symptoms.  History of chronic kidney disease and is a stable.  Patient complained of dysuria and was recently treated for UTI.  Repeat urinalysis here does show evidence of infection.  Review of patient's old chart shows that he has had pansensitive E. coli in the past.  We will place patient on Keflex and have him follow-up with his primary care doctor Final Clinical Impression(s) / ED Diagnoses Final diagnoses:  None    Rx / DC Orders ED Discharge Orders    None       Lacretia Leigh, MD 02/20/21 1431

## 2021-02-20 NOTE — ED Triage Notes (Signed)
Patient coming from church with c/o near syncope episode. Patient was diaphoretic and BP 98/68.

## 2021-02-20 NOTE — ED Notes (Signed)
Pt ambulated to restroom with stand by assist.  

## 2021-02-22 LAB — URINE CULTURE: Culture: >=100000 — AB

## 2021-02-23 ENCOUNTER — Telehealth: Payer: Self-pay | Admitting: *Deleted

## 2021-02-23 DIAGNOSIS — Z08 Encounter for follow-up examination after completed treatment for malignant neoplasm: Secondary | ICD-10-CM | POA: Diagnosis not present

## 2021-02-23 DIAGNOSIS — R338 Other retention of urine: Secondary | ICD-10-CM | POA: Diagnosis not present

## 2021-02-23 DIAGNOSIS — Z85828 Personal history of other malignant neoplasm of skin: Secondary | ICD-10-CM | POA: Diagnosis not present

## 2021-02-23 NOTE — Telephone Encounter (Signed)
Post ED Visit - Positive Culture Follow-up: Successful Patient Follow-Up  Culture assessed and recommendations reviewed by:  [x]  Elenor Quinones, Pharm.D. []  Heide Guile, Pharm.D., BCPS AQ-ID []  Parks Neptune, Pharm.D., BCPS []  Alycia Rossetti, Pharm.D., BCPS []  [place], [place].D., BCPS, AAHIVP []  Legrand Como, Pharm.D., BCPS, AAHIVP []  Salome Arnt, PharmD, BCPS []  Johnnette Gourd, PharmD, BCPS []  Hughes Better, PharmD, BCPS []  Leeroy Cha, PharmD  Positive urine culture  []  Patient discharged without antimicrobial prescription and treatment is now indicated [x]  Organism is resistant to prescribed ED discharge antimicrobial []  Patient with positive blood cultures  Changes discussed with ED provider Madalyn Rob, MD New antibiotic prescription Ciprofloxacin 500mg  daily x 3 days, Stop Keflex Called to [facility] [address] [place]  Contacted patient, date 02/23/2021, time [place], [place] 02/23/2021, 11:12 AM

## 2021-03-02 DIAGNOSIS — X32XXXD Exposure to sunlight, subsequent encounter: Secondary | ICD-10-CM | POA: Diagnosis not present

## 2021-03-02 DIAGNOSIS — C4442 Squamous cell carcinoma of skin of scalp and neck: Secondary | ICD-10-CM | POA: Diagnosis not present

## 2021-03-02 DIAGNOSIS — L570 Actinic keratosis: Secondary | ICD-10-CM | POA: Diagnosis not present

## 2021-03-02 DIAGNOSIS — L820 Inflamed seborrheic keratosis: Secondary | ICD-10-CM | POA: Diagnosis not present

## 2021-04-21 ENCOUNTER — Inpatient Hospital Stay (HOSPITAL_COMMUNITY): Payer: Medicare Other

## 2021-04-21 ENCOUNTER — Encounter: Payer: Self-pay | Admitting: *Deleted

## 2021-04-21 ENCOUNTER — Emergency Department (HOSPITAL_COMMUNITY): Payer: Medicare Other

## 2021-04-21 ENCOUNTER — Encounter (HOSPITAL_COMMUNITY): Payer: Self-pay | Admitting: Emergency Medicine

## 2021-04-21 ENCOUNTER — Inpatient Hospital Stay (HOSPITAL_COMMUNITY)
Admission: EM | Admit: 2021-04-21 | Discharge: 2021-04-29 | DRG: 082 | Disposition: A | Discharge status: Hospice | Payer: Medicare Other | Attending: Student | Admitting: Student

## 2021-04-21 DIAGNOSIS — I77810 Thoracic aortic ectasia: Secondary | ICD-10-CM | POA: Diagnosis present

## 2021-04-21 DIAGNOSIS — Z20822 Contact with and (suspected) exposure to covid-19: Secondary | ICD-10-CM | POA: Diagnosis present

## 2021-04-21 DIAGNOSIS — R402142 Coma scale, eyes open, spontaneous, at arrival to emergency department: Secondary | ICD-10-CM | POA: Diagnosis present

## 2021-04-21 DIAGNOSIS — I10 Essential (primary) hypertension: Secondary | ICD-10-CM | POA: Diagnosis not present

## 2021-04-21 DIAGNOSIS — Z86711 Personal history of pulmonary embolism: Secondary | ICD-10-CM | POA: Diagnosis not present

## 2021-04-21 DIAGNOSIS — N390 Urinary tract infection, site not specified: Secondary | ICD-10-CM | POA: Diagnosis present

## 2021-04-21 DIAGNOSIS — F0280 Dementia in other diseases classified elsewhere without behavioral disturbance: Secondary | ICD-10-CM | POA: Diagnosis present

## 2021-04-21 DIAGNOSIS — G9341 Metabolic encephalopathy: Secondary | ICD-10-CM | POA: Diagnosis present

## 2021-04-21 DIAGNOSIS — R569 Unspecified convulsions: Secondary | ICD-10-CM

## 2021-04-21 DIAGNOSIS — G319 Degenerative disease of nervous system, unspecified: Secondary | ICD-10-CM | POA: Diagnosis not present

## 2021-04-21 DIAGNOSIS — G9340 Encephalopathy, unspecified: Secondary | ICD-10-CM | POA: Diagnosis not present

## 2021-04-21 DIAGNOSIS — R7989 Other specified abnormal findings of blood chemistry: Secondary | ICD-10-CM | POA: Diagnosis present

## 2021-04-21 DIAGNOSIS — R0902 Hypoxemia: Secondary | ICD-10-CM | POA: Diagnosis not present

## 2021-04-21 DIAGNOSIS — Z85038 Personal history of other malignant neoplasm of large intestine: Secondary | ICD-10-CM

## 2021-04-21 DIAGNOSIS — Z66 Do not resuscitate: Secondary | ICD-10-CM | POA: Diagnosis present

## 2021-04-21 DIAGNOSIS — Z1619 Resistance to other specified beta lactam antibiotics: Secondary | ICD-10-CM | POA: Diagnosis present

## 2021-04-21 DIAGNOSIS — N3000 Acute cystitis without hematuria: Secondary | ICD-10-CM | POA: Diagnosis not present

## 2021-04-21 DIAGNOSIS — R55 Syncope and collapse: Secondary | ICD-10-CM | POA: Diagnosis not present

## 2021-04-21 DIAGNOSIS — Y846 Urinary catheterization as the cause of abnormal reaction of the patient, or of later complication, without mention of misadventure at the time of the procedure: Secondary | ICD-10-CM | POA: Diagnosis present

## 2021-04-21 DIAGNOSIS — D631 Anemia in chronic kidney disease: Secondary | ICD-10-CM | POA: Diagnosis not present

## 2021-04-21 DIAGNOSIS — I739 Peripheral vascular disease, unspecified: Secondary | ICD-10-CM | POA: Diagnosis present

## 2021-04-21 DIAGNOSIS — G9389 Other specified disorders of brain: Secondary | ICD-10-CM | POA: Diagnosis present

## 2021-04-21 DIAGNOSIS — Z923 Personal history of irradiation: Secondary | ICD-10-CM

## 2021-04-21 DIAGNOSIS — R262 Difficulty in walking, not elsewhere classified: Secondary | ICD-10-CM | POA: Diagnosis not present

## 2021-04-21 DIAGNOSIS — E538 Deficiency of other specified B group vitamins: Secondary | ICD-10-CM | POA: Diagnosis present

## 2021-04-21 DIAGNOSIS — S066X9A Traumatic subarachnoid hemorrhage with loss of consciousness of unspecified duration, initial encounter: Secondary | ICD-10-CM | POA: Diagnosis present

## 2021-04-21 DIAGNOSIS — G253 Myoclonus: Secondary | ICD-10-CM | POA: Diagnosis present

## 2021-04-21 DIAGNOSIS — Z8546 Personal history of malignant neoplasm of prostate: Secondary | ICD-10-CM

## 2021-04-21 DIAGNOSIS — Z7982 Long term (current) use of aspirin: Secondary | ICD-10-CM

## 2021-04-21 DIAGNOSIS — R402362 Coma scale, best motor response, obeys commands, at arrival to emergency department: Secondary | ICD-10-CM | POA: Diagnosis present

## 2021-04-21 DIAGNOSIS — I4891 Unspecified atrial fibrillation: Secondary | ICD-10-CM | POA: Diagnosis not present

## 2021-04-21 DIAGNOSIS — S0990XA Unspecified injury of head, initial encounter: Secondary | ICD-10-CM | POA: Diagnosis not present

## 2021-04-21 DIAGNOSIS — B9689 Other specified bacterial agents as the cause of diseases classified elsewhere: Secondary | ICD-10-CM | POA: Diagnosis present

## 2021-04-21 DIAGNOSIS — I6389 Other cerebral infarction: Secondary | ICD-10-CM | POA: Diagnosis not present

## 2021-04-21 DIAGNOSIS — Z882 Allergy status to sulfonamides status: Secondary | ICD-10-CM

## 2021-04-21 DIAGNOSIS — G309 Alzheimer's disease, unspecified: Secondary | ICD-10-CM | POA: Diagnosis present

## 2021-04-21 DIAGNOSIS — Z87440 Personal history of urinary (tract) infections: Secondary | ICD-10-CM

## 2021-04-21 DIAGNOSIS — T83511A Infection and inflammatory reaction due to indwelling urethral catheter, initial encounter: Secondary | ICD-10-CM | POA: Diagnosis present

## 2021-04-21 DIAGNOSIS — I609 Nontraumatic subarachnoid hemorrhage, unspecified: Secondary | ICD-10-CM | POA: Diagnosis not present

## 2021-04-21 DIAGNOSIS — Y92009 Unspecified place in unspecified non-institutional (private) residence as the place of occurrence of the external cause: Secondary | ICD-10-CM | POA: Diagnosis not present

## 2021-04-21 DIAGNOSIS — I639 Cerebral infarction, unspecified: Secondary | ICD-10-CM | POA: Diagnosis not present

## 2021-04-21 DIAGNOSIS — R402242 Coma scale, best verbal response, confused conversation, at arrival to emergency department: Secondary | ICD-10-CM | POA: Diagnosis present

## 2021-04-21 DIAGNOSIS — N183 Chronic kidney disease, stage 3 unspecified: Secondary | ICD-10-CM | POA: Diagnosis not present

## 2021-04-21 DIAGNOSIS — I517 Cardiomegaly: Secondary | ICD-10-CM | POA: Diagnosis not present

## 2021-04-21 DIAGNOSIS — T424X5A Adverse effect of benzodiazepines, initial encounter: Secondary | ICD-10-CM | POA: Diagnosis present

## 2021-04-21 DIAGNOSIS — R296 Repeated falls: Secondary | ICD-10-CM | POA: Diagnosis not present

## 2021-04-21 DIAGNOSIS — N179 Acute kidney failure, unspecified: Secondary | ICD-10-CM | POA: Diagnosis present

## 2021-04-21 DIAGNOSIS — E785 Hyperlipidemia, unspecified: Secondary | ICD-10-CM | POA: Diagnosis present

## 2021-04-21 DIAGNOSIS — Z515 Encounter for palliative care: Secondary | ICD-10-CM | POA: Diagnosis not present

## 2021-04-21 DIAGNOSIS — I69318 Other symptoms and signs involving cognitive functions following cerebral infarction: Secondary | ICD-10-CM | POA: Diagnosis not present

## 2021-04-21 DIAGNOSIS — W19XXXA Unspecified fall, initial encounter: Secondary | ICD-10-CM | POA: Diagnosis not present

## 2021-04-21 DIAGNOSIS — R5381 Other malaise: Secondary | ICD-10-CM | POA: Diagnosis not present

## 2021-04-21 DIAGNOSIS — R627 Adult failure to thrive: Secondary | ICD-10-CM | POA: Diagnosis present

## 2021-04-21 DIAGNOSIS — Z960 Presence of urogenital implants: Secondary | ICD-10-CM | POA: Diagnosis not present

## 2021-04-21 DIAGNOSIS — D649 Anemia, unspecified: Secondary | ICD-10-CM

## 2021-04-21 DIAGNOSIS — I129 Hypertensive chronic kidney disease with stage 1 through stage 4 chronic kidney disease, or unspecified chronic kidney disease: Secondary | ICD-10-CM | POA: Diagnosis present

## 2021-04-21 DIAGNOSIS — N1832 Chronic kidney disease, stage 3b: Secondary | ICD-10-CM | POA: Diagnosis not present

## 2021-04-21 DIAGNOSIS — Z86718 Personal history of other venous thrombosis and embolism: Secondary | ICD-10-CM | POA: Diagnosis not present

## 2021-04-21 DIAGNOSIS — R339 Retention of urine, unspecified: Secondary | ICD-10-CM | POA: Diagnosis present

## 2021-04-21 DIAGNOSIS — E876 Hypokalemia: Secondary | ICD-10-CM | POA: Diagnosis present

## 2021-04-21 DIAGNOSIS — M109 Gout, unspecified: Secondary | ICD-10-CM | POA: Diagnosis present

## 2021-04-21 DIAGNOSIS — Z87891 Personal history of nicotine dependence: Secondary | ICD-10-CM

## 2021-04-21 DIAGNOSIS — G40909 Epilepsy, unspecified, not intractable, without status epilepticus: Secondary | ICD-10-CM | POA: Diagnosis not present

## 2021-04-21 DIAGNOSIS — H9190 Unspecified hearing loss, unspecified ear: Secondary | ICD-10-CM | POA: Diagnosis present

## 2021-04-21 DIAGNOSIS — D638 Anemia in other chronic diseases classified elsewhere: Secondary | ICD-10-CM | POA: Diagnosis present

## 2021-04-21 DIAGNOSIS — S3993XA Unspecified injury of pelvis, initial encounter: Secondary | ICD-10-CM | POA: Diagnosis not present

## 2021-04-21 DIAGNOSIS — R9431 Abnormal electrocardiogram [ECG] [EKG]: Secondary | ICD-10-CM | POA: Diagnosis not present

## 2021-04-21 DIAGNOSIS — Z79899 Other long term (current) drug therapy: Secondary | ICD-10-CM

## 2021-04-21 DIAGNOSIS — R531 Weakness: Secondary | ICD-10-CM | POA: Diagnosis not present

## 2021-04-21 DIAGNOSIS — Z8042 Family history of malignant neoplasm of prostate: Secondary | ICD-10-CM

## 2021-04-21 DIAGNOSIS — Z7401 Bed confinement status: Secondary | ICD-10-CM | POA: Diagnosis not present

## 2021-04-21 LAB — COMPREHENSIVE METABOLIC PANEL
ALT: 12 U/L (ref 0–44)
AST: 16 U/L (ref 15–41)
Albumin: 3.5 g/dL (ref 3.5–5.0)
Alkaline Phosphatase: 73 U/L (ref 38–126)
Anion gap: 8 (ref 5–15)
BUN: 38 mg/dL — ABNORMAL HIGH (ref 8–23)
CO2: 25 mmol/L (ref 22–32)
Calcium: 9.8 mg/dL (ref 8.9–10.3)
Chloride: 110 mmol/L (ref 98–111)
Creatinine, Ser: 2.07 mg/dL — ABNORMAL HIGH (ref 0.61–1.24)
GFR, Estimated: 30 mL/min — ABNORMAL LOW (ref >60)
Glucose, Bld: 90 mg/dL (ref 70–99)
Potassium: 3.8 mmol/L (ref 3.5–5.1)
Sodium: 143 mmol/L (ref 135–145)
Total Bilirubin: 0.6 mg/dL (ref 0.3–1.2)
Total Protein: 6.5 g/dL (ref 6.5–8.1)

## 2021-04-21 LAB — CBC WITH DIFFERENTIAL/PLATELET
Abs Immature Granulocytes: 0.03 10*3/uL (ref 0.00–0.07)
Basophils Absolute: 0 10*3/uL (ref 0.0–0.1)
Basophils Relative: 1 %
Eosinophils Absolute: 0.3 10*3/uL (ref 0.0–0.5)
Eosinophils Relative: 4 %
HCT: 29.9 % — ABNORMAL LOW (ref 39.0–52.0)
Hemoglobin: 9.8 g/dL — ABNORMAL LOW (ref 13.0–17.0)
Immature Granulocytes: 0 %
Lymphocytes Relative: 8 %
Lymphs Abs: 0.7 10*3/uL (ref 0.7–4.0)
MCH: 30.2 pg (ref 26.0–34.0)
MCHC: 32.8 g/dL (ref 30.0–36.0)
MCV: 92.3 fL (ref 80.0–100.0)
Monocytes Absolute: 0.4 10*3/uL (ref 0.1–1.0)
Monocytes Relative: 5 %
Neutro Abs: 6.8 10*3/uL (ref 1.7–7.7)
Neutrophils Relative %: 82 %
Platelets: 200 10*3/uL (ref 150–400)
RBC: 3.24 MIL/uL — ABNORMAL LOW (ref 4.22–5.81)
RDW: 15 % (ref 11.5–15.5)
WBC: 8.2 10*3/uL (ref 4.0–10.5)
nRBC: 0 % (ref 0.0–0.2)

## 2021-04-21 LAB — URINALYSIS, ROUTINE W REFLEX MICROSCOPIC
Bilirubin Urine: NEGATIVE
Glucose, UA: NEGATIVE mg/dL
Ketones, ur: NEGATIVE mg/dL
Nitrite: POSITIVE — AB
Protein, ur: 100 mg/dL — AB
RBC / HPF: >50 RBC/hpf — ABNORMAL HIGH (ref 0–5)
Specific Gravity, Urine: 1.013 (ref 1.005–1.030)
WBC, UA: >50 WBC/hpf — ABNORMAL HIGH (ref 0–5)
pH: 6 (ref 5.0–8.0)

## 2021-04-21 LAB — RESP PANEL BY RT-PCR (FLU A&B, COVID) ARPGX2
Influenza A by PCR: NEGATIVE
Influenza B by PCR: NEGATIVE
SARS Coronavirus 2 by RT PCR: NEGATIVE

## 2021-04-21 LAB — LACTIC ACID, PLASMA: Lactic Acid, Venous: 1.2 mmol/L (ref 0.5–1.9)

## 2021-04-21 LAB — D-DIMER, QUANTITATIVE: D-Dimer, Quant: 2.99 ug/mL-FEU — ABNORMAL HIGH (ref 0.00–0.50)

## 2021-04-21 LAB — VITAMIN B12: Vitamin B-12: 131 pg/mL — ABNORMAL LOW (ref 180–914)

## 2021-04-21 LAB — CBG MONITORING, ED: Glucose-Capillary: 81 mg/dL (ref 70–99)

## 2021-04-21 LAB — TSH: TSH: 3.124 u[IU]/mL (ref 0.350–4.500)

## 2021-04-21 LAB — CK: Total CK: 145 U/L (ref 49–397)

## 2021-04-21 MED ORDER — LEVETIRACETAM IN NACL 1000 MG/100ML IV SOLN
1000.0000 mg | Freq: Once | INTRAVENOUS | Status: AC
  Administered 2021-04-21: 12:00:00 1000 mg via INTRAVENOUS
  Filled 2021-04-21: qty 100

## 2021-04-21 MED ORDER — ALLOPURINOL 100 MG PO TABS
300.0000 mg | ORAL_TABLET | Freq: Every evening | ORAL | Status: DC
  Administered 2021-04-22: 300 mg via ORAL
  Filled 2021-04-21 (×4): qty 3

## 2021-04-21 MED ORDER — SODIUM CHLORIDE 0.9% FLUSH
3.0000 mL | Freq: Two times a day (BID) | INTRAVENOUS | Status: DC
  Administered 2021-04-21 – 2021-04-27 (×11): 3 mL via INTRAVENOUS

## 2021-04-21 MED ORDER — LORAZEPAM 2 MG/ML IJ SOLN
0.5000 mg | Freq: Once | INTRAMUSCULAR | Status: AC
  Administered 2021-04-21: 11:00:00 0.5 mg via INTRAVENOUS
  Filled 2021-04-21: qty 1

## 2021-04-21 MED ORDER — PIPERACILLIN-TAZOBACTAM 3.375 G IVPB 30 MIN
3.3750 g | Freq: Once | INTRAVENOUS | Status: AC
  Administered 2021-04-21: 3.375 g via INTRAVENOUS
  Filled 2021-04-21: qty 50

## 2021-04-21 MED ORDER — SODIUM CHLORIDE 0.9 % IV SOLN: INTRAVENOUS | Status: DC

## 2021-04-21 MED ORDER — CARVEDILOL 6.25 MG PO TABS
6.2500 mg | ORAL_TABLET | Freq: Two times a day (BID) | ORAL | Status: DC
  Administered 2021-04-22 – 2021-04-25 (×4): 6.25 mg via ORAL
  Filled 2021-04-21 (×8): qty 1

## 2021-04-21 MED ORDER — ASPIRIN EC 81 MG PO TBEC
81.0000 mg | DELAYED_RELEASE_TABLET | Freq: Every day | ORAL | Status: DC
  Administered 2021-04-21 – 2021-04-22 (×2): 81 mg via ORAL
  Filled 2021-04-21 (×2): qty 1

## 2021-04-21 MED ORDER — PIPERACILLIN-TAZOBACTAM 3.375 G IVPB
3.3750 g | Freq: Three times a day (TID) | INTRAVENOUS | Status: DC
  Administered 2021-04-21 – 2021-04-24 (×8): 3.375 g via INTRAVENOUS
  Filled 2021-04-21 (×9): qty 50

## 2021-04-21 MED ORDER — CHLORHEXIDINE GLUCONATE CLOTH 2 % EX PADS: 6.0000 | MEDICATED_PAD | Freq: Every day | CUTANEOUS | Status: DC

## 2021-04-21 MED ORDER — LEVETIRACETAM IN NACL 1000 MG/100ML IV SOLN
1000.0000 mg | Freq: Two times a day (BID) | INTRAVENOUS | Status: DC
  Administered 2021-04-21: 1000 mg via INTRAVENOUS
  Filled 2021-04-21 (×2): qty 100

## 2021-04-21 MED ORDER — LORAZEPAM 2 MG/ML IJ SOLN
0.5000 mg | Freq: Once | INTRAMUSCULAR | Status: AC
  Administered 2021-04-21: 11:00:00 1 mg via INTRAVENOUS
  Filled 2021-04-21: qty 1

## 2021-04-21 MED ORDER — LEVETIRACETAM IN NACL 500 MG/100ML IV SOLN
500.0000 mg | Freq: Two times a day (BID) | INTRAVENOUS | Status: DC
  Administered 2021-04-22 – 2021-04-24 (×5): 500 mg via INTRAVENOUS
  Filled 2021-04-21 (×5): qty 100

## 2021-04-21 MED ORDER — HEPARIN SODIUM (PORCINE) 5000 UNIT/ML IJ SOLN
5000.0000 [IU] | Freq: Three times a day (TID) | INTRAMUSCULAR | Status: DC
  Administered 2021-04-21 – 2021-04-22 (×4): 5000 [IU] via SUBCUTANEOUS
  Filled 2021-04-21 (×4): qty 1

## 2021-04-21 MED ORDER — SODIUM CHLORIDE 0.9 % IV SOLN
1.0000 g | Freq: Once | INTRAVENOUS | Status: AC
  Administered 2021-04-21: 1 g via INTRAVENOUS
  Filled 2021-04-21: qty 10

## 2021-04-21 NOTE — ED Notes (Signed)
Patient transported to CT 

## 2021-04-21 NOTE — Congregational Nurse Program (Signed)
[facility]-church and wife patient fell and was found by the wife.  Could not get up and ems was called and patient taken to w.[facility]. for further evaluation.  Wife reports that the patient has been confused and that he has not been taking his bp meds.  Will follow for any needs  Vernia Buff [facility] [phone]

## 2021-04-21 NOTE — ED Provider Notes (Addendum)
[facility] DEPT Provider Note   CSN: [id number] Arrival date & time: 04/21/21  1004     History Chief Complaint  Patient presents with   Weakness    [name] is a 87 y.o. male.  Pt presents to the ED today with a fall.  Pt has a hx of alzheimer's disease and is unable to tell me what happened.  He does not remember the fall and denies any pain.  Pt's wife found him on the ground this am and could not get him up.  His left leg is spasming, but he denies pain there.      Past Medical History:  Diagnosis Date   Adenocarcinoma of colon ([place])    Arthritis    gout   CKD (chronic kidney disease), stage III ([place])    Dermatitis    Diverticulitis    Gout    no recent flare   Hemorrhoids    History of blood transfusion    Hypertension    Incontinence    Peripheral vascular disease ([place]) 2011   DVT right leg   Prostate carcinoma (H[facility])    Pulmonary embolus ([place])    s/p hernia surgery 2012   Rosacea    Sciatica    Urinary retention    indwelling catheter   Wears dentures    upper    Wears glasses     Patient Active Problem List   Diagnosis Date Noted    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 01/06/14 01/06/2014   Colon cancer ([place]) 12/15/2013   Acute lower GI bleeding 11/14/2013   History of pulmonary embolism 11/14/2013   LOCALIZED SUPERFICIAL SWELLING MASS OR LUMP 09/30/2010   DIVERTICULAR DISEASE 06/21/2009   ACNE ROSACEA 05/12/2009   ARTHRALGIA 05/12/2009   Gout, unspecified 05/11/2008   HYPERTENSION 05/11/2008   KIDNEY DISEASE, CHRONIC NOS 05/11/2008   COUGH 04/17/2007   ESOPHAGEAL STRICTURE 01/23/2007   ADENOCARCINOMA, PROSTATE, HX OF 01/23/2007    Past Surgical History:  Procedure Laterality Date   COLON SURGERY     COLONOSCOPY N/A 11/17/2013   Procedure: COLONOSCOPY;  Surgeon: Jeryl Columbia, MD;  Location: [facility];  Service: Endoscopy;  Laterality: N/A;   cyst removal L neck/shoulder     CYSTOSCOPY W/  RETROGRADES Bilateral 08/16/2018   Procedure: CYSTOSCOPY WITH BILATERAL  RETROGRADE PYELOGRAM;  Surgeon: Lucas Mallow, MD;  Location: [facility];  Service: Urology;  Laterality: Bilateral;   diverticulitis     EYE SURGERY Bilateral    cataract extraction with IOL   hemorrhoids   1961, [place]   inguinal   HOT HEMOSTASIS N/A 11/17/2013   Procedure: HOT HEMOSTASIS (ARGON PLASMA COAGULATION/BICAP);  Surgeon: Jeryl Columbia, MD;  Location: [facility];  Service: Endoscopy;  Laterality: N/A;   ILEOSTOMY     LAPAROSCOPIC LYSIS OF ADHESIONS N/A 01/06/2014   Procedure: LAPAROSCOPIC LYSIS OF ADHESIONS;  Surgeon: Odis Hollingshead, MD;  Location: [facility];  Service: General;  Laterality: N/A;   LAPAROSCOPIC PARTIAL COLECTOMY N/A 01/06/2014   Procedure: LAPAROSCOPIC ASSISTED PARTIAL COLECTOMY;  Surgeon: Odis Hollingshead, MD;  Location: [facility];  Service: General;  Laterality: N/A;   PROSTATE REMOVED  1992   SHOULDER SURGERY     L bursa shoulder removal 2012    TONSILLECTOMY     TRANSURETHRAL RESECTION OF BLADDER TUMOR N/A 08/16/2018   Procedure: TRANSURETHRAL RESECTION OF BLADDER TUMOR (TURBT);  Surgeon: Lucas Mallow,  MD;  Location: [facility];  Service: Urology;  Laterality: N/A;   TRANSURETHRAL RESECTION OF BLADDER TUMOR WITH MITOMYCIN-C N/A 02/03/2019   Procedure: TRANSURETHRAL RESECTION OF BLADDER TUMOR WITH GEMCITABINE;  Surgeon: Lucas Mallow, MD;  Location: [facility];  Service: Urology;  Laterality: N/A;   upper teeth removal  2018       Family History  Problem Relation Age of Onset   Prostate cancer Father     Social History   Tobacco Use   Smoking status: Former    Pack years: 0.00    Types: Cigarettes, Pipe    Quit date: 01/02/1980    Years since quitting: 41.3   Smokeless tobacco: Never  Vaping Use   Vaping Use: Never used  Substance Use Topics   Alcohol use: No   Drug use: No    Home Medications Prior to Admission medications   Medication Sig  Start Date End Date Taking? Authorizing Provider  acetaminophen (TYLENOL) 500 MG tablet Take 1,000 mg by mouth every 8 (eight) hours as needed for mild pain.    [provider]  allopurinol (ZYLOPRIM) 300 MG tablet Take 300 mg by mouth every evening.     [provider]  aspirin EC 81 MG tablet Take 81 mg by mouth daily.    [provider]  carvedilol (COREG) 6.25 MG tablet Take 6.25 mg by mouth 2 (two) times daily with a meal.      [provider]  cephALEXin (KEFLEX) 500 MG capsule Take 1 capsule (500 mg total) by mouth 4 (four) times daily. 11/08/20   Deno Etienne, DO  cephALEXin (KEFLEX) 500 MG capsule Take 1 capsule (500 mg total) by mouth 3 (three) times daily. 02/20/21   Lacretia Leigh, MD  hydrochlorothiazide (HYDRODIURIL) 50 MG tablet Take 50 mg by mouth every evening.     [provider]  Multiple Vitamin (MULTIVITAMIN WITH MINERALS) TABS tablet Take 1 tablet by mouth every evening.     [provider]  potassium chloride SA (K-DUR,KLOR-CON) 20 MEQ tablet Take 20 mEq by mouth every evening.  07/13/18   [provider]    Allergies    Sulfa antibiotics and Sulfonamide derivatives  Review of Systems   Review of Systems  Unable to perform ROS: Dementia  All other systems reviewed and are negative.  Physical Exam Updated Vital Signs BP (!) 146/88   Pulse 84   Temp 98 F (36.7 C) (Oral)   Resp 10   Ht 5\' 10"  (1.778 m)   Wt 77.1 kg   SpO2 100%   BMI 24.39 kg/m   Physical Exam Vitals and nursing note reviewed.  Constitutional:      Appearance: Normal appearance.  HENT:     Head: Normocephalic and atraumatic.     Right Ear: External ear normal.     Left Ear: External ear normal.     Nose: Nose normal.     Mouth/Throat:     Mouth: Mucous membranes are moist.     Pharynx: Oropharynx is clear.  Eyes:     Extraocular Movements: Extraocular movements intact.     Conjunctiva/sclera: Conjunctivae normal.     Pupils:  Pupils are equal, round, and reactive to light.  Cardiovascular:     Rate and Rhythm: Normal rate and regular rhythm.     Pulses: Normal pulses.     Heart sounds: Normal heart sounds.  Pulmonary:     Effort: Pulmonary effort is normal.  Breath sounds: Normal breath sounds.  Abdominal:     General: Abdomen is flat. Bowel sounds are normal.     Palpations: Abdomen is soft.  Musculoskeletal:        General: Normal range of motion.     Cervical back: Normal range of motion and neck supple.  Skin:    General: Skin is warm.     Capillary Refill: Capillary refill takes less than 2 seconds.  Neurological:     General: No focal deficit present.     Mental Status: He is alert. Mental status is at baseline.     Comments: Left arm, abdomen, leg rhythmic spasm  Psychiatric:        Mood and Affect: Mood normal.   ED Results / Procedures / Treatments   Labs (all labs ordered are listed, but only abnormal results are displayed) Labs Reviewed  CBC WITH DIFFERENTIAL/PLATELET - Abnormal; Notable for the following components:      Result Value   RBC 3.24 (*)    Hemoglobin 9.8 (*)    HCT 29.9 (*)    All other components within normal limits  COMPREHENSIVE METABOLIC PANEL - Abnormal; Notable for the following components:   BUN 38 (*)    Creatinine, Ser 2.07 (*)    GFR, Estimated 30 (*)    All other components within normal limits  URINALYSIS, ROUTINE W REFLEX MICROSCOPIC - Abnormal; Notable for the following components:   APPearance CLOUDY (*)    Hgb urine dipstick LARGE (*)    Protein, ur 100 (*)    Nitrite POSITIVE (*)    Leukocytes,Ua LARGE (*)    RBC / HPF >50 (*)    WBC, UA >50 (*)    Bacteria, UA FEW (*)    All other components within normal limits  URINE CULTURE  CULTURE, BLOOD (ROUTINE X 2)  CULTURE, BLOOD (ROUTINE X 2)  RESP PANEL BY RT-PCR (FLU A&B, COVID) ARPGX2  LACTIC ACID, PLASMA  CK  CBG MONITORING, ED    EKG EKG Interpretation  Date/Time:  Thursday April 21 2021 10:39:05 EDT Ventricular Rate:  100 PR Interval:  118 QRS Duration: 121 QT Interval:  398 QTC Calculation: 514 R Axis:   -79 Text Interpretation: Sinus rhythm Ventricular bigeminy Sinus pause RBBB and LAFB ST elevation, consider inferior injury No significant change since last tracing Confirmed by [name] [phone]) on 04/21/2021 11:07:01 AM  Radiology CT Head Wo Contrast  Result Date: 04/21/2021 CLINICAL DATA:  87 year old male found down. EXAM: CT HEAD WITHOUT CONTRAST TECHNIQUE: Contiguous axial images were obtained from the base of the skull through the vertex without intravenous contrast. COMPARISON:  Head CT 11/08/2020. FINDINGS: Brain: Stable cerebral volume loss. Chronic encephalomalacia in the left inferior temporal lobe is stable. No midline shift, ventriculomegaly, mass effect, evidence of mass lesion, intracranial hemorrhage or evidence of cortically based acute infarction. Stable gray-white matter differentiation throughout the brain. Vascular: Mild Calcified atherosclerosis at the skull base. Intracranial artery dolichoectasia. No suspicious intracranial vascular hyperdensity. Skull: Stable and intact. Sinuses/Orbits: Visualized paranasal sinuses and mastoids are stable and well aerated. Other: Several chronic surgical clips along the right posterior scalp convexity are stable. No acute orbit or scalp soft tissue injury identified. IMPRESSION: 1. No acute traumatic injury identified. 2. Stable non contrast CT appearance of the brain with generalized volume loss and chronic left temporal lobe encephalomalacia. Electronically Signed   By: Genevie Ann M.D.   On: 04/21/2021 11:30   DG Pelvis Portable  Result  Date: 04/21/2021 CLINICAL DATA:  87 year old male found down. EXAM: PORTABLE PELVIS 1-2 VIEWS COMPARISON:  CT Abdomen and Pelvis 08/09/2018. FINDINGS: Portable AP supine view at 1054 hours. Femoral heads are normally located. Pelvis appears stable and intact. Bilateral pelvic  sidewall surgical clips are stable. SI joints appear symmetric. Grossly intact proximal femurs. No acute osseous abnormality identified. Negative visible lower abdominal and pelvic visceral contours. IMPRESSION: No acute fracture or dislocation identified about the pelvis. Electronically Signed   By: Genevie Ann M.D.   On: 04/21/2021 11:28   DG Chest Portable 1 View  Result Date: 04/21/2021 CLINICAL DATA:  Fall. EXAM: PORTABLE CHEST 1 VIEW COMPARISON:  11/08/2020. FINDINGS: Cardiomegaly. No pulmonary venous congestion. Low lung volumes. Lungs are clear. No pleural effusion or pneumothorax. Right skin fold noted. Degenerative changes scoliosis thoracic spine. IMPRESSION: 1.  Cardiomegaly.  No pulmonary venous congestion. 2.  Low lung volumes.  No acute pulmonary disease. Electronically Signed   By: Marcello Moores  Register   On: 04/21/2021 11:26    Procedures Procedures   Medications Ordered in ED Medications  cefTRIAXone (ROCEPHIN) 1 g in sodium chloride 0.9 % 100 mL IVPB (has no administration in time range)  levETIRAcetam (KEPPRA) IVPB 1000 mg/100 mL premix (1,000 mg Intravenous New Bag/Given 04/21/21 1213)  LORazepam (ATIVAN) injection 0.5 mg (1 mg Intravenous Given 04/21/21 1052)  LORazepam (ATIVAN) injection 0.5 mg (0.5 mg Intravenous Given 04/21/21 1110)  levETIRAcetam (KEPPRA) IVPB 1000 mg/100 mL premix (0 mg Intravenous Stopped 04/21/21 1217)    ED Course  I have reviewed the triage vital signs and the nursing notes.  Pertinent labs & imaging results that were available during my care of the patient were reviewed by me and considered in my medical decision making (see chart for details).    MDM Rules/Calculators/A&P                          Pt appeared to be having a focal seizure.  He was given Ativan 1.5 mg IV and keppra.  Twitching has improved, but not totally stopped.  He was d/w Dr. Cheral Marker (neurology) who recommended transfer to [facility] ED for a stat EEG and a neurology consult.  He also asked  that we give an additional 1 g keppra IV.  EEG ordered.  Call neurology when pt arrives.  Pt also with UTI and was started on rocephin.  Pt's wife updated and knows he is going to [facility].  Pt d/w Dr. Johnney Killian who accepted pt for transfer.  CRITICAL CARE Performed by: [name]   Total critical care time: 30 minutes  Critical care time was exclusive of separately billable procedures and treating other patients.  Critical care was necessary to treat or prevent imminent or life-threatening deterioration.  Critical care was time spent personally by me on the following activities: development of treatment plan with patient and/or surrogate as well as nursing, discussions with consultants, evaluation of patient's response to treatment, examination of patient, obtaining history from patient or surrogate, ordering and performing treatments and interventions, ordering and review of laboratory studies, ordering and review of radiographic studies, pulse oximetry and re-evaluation of patient's condition.   Final Clinical Impression(s) / ED Diagnoses Final diagnoses:  Acute cystitis without hematuria  Seizure ([place])  Chronic anemia  Stage 3b chronic kidney disease ([place])    Rx / DC Orders ED Discharge Orders     None        [name], MD 04/21/21 1218  [name], MD 04/21/21 1228

## 2021-04-21 NOTE — ED Triage Notes (Signed)
Pt BIB [facility] from home. Pt's wife found him on the floor, neither able to get him up. Denies pain, no neuro deficits. No obvious injury. No blood thinners. Hx alzheimers and HTN. When EMS attempted to ambulate, left leg spasming. EMS reports odor to urine. 18ga LAC. Irreg EKG but no reported hx of a-fib.  BP 144/68 HR 88 RR 16 CBG 92 SpO2 99% RA

## 2021-04-21 NOTE — Consult Note (Signed)
Neurology Consultation  Reason for Consult: seizure like activity.  Referring Physician: J. Gilford Raid, MD.   CC: ? Seizure activity.   History is obtained from: chart.   HPI: [name] is a 87 y.o. male with a PMHx of Alzheimer's dementia, HTN, HLD, CKD III, bladder tumor s/p TURB, urinary retention with chronic Foley, gout, colon cancer, and PVD. Patient presented to [name] ED today after an unwitnessed fall at home. Wife found patient on the floor and could not get him up. 911 was called.   In the [name] ED, patient noted to have muscle spasm in left leg. No pain. Patient was given a 2 gm load of Keppra IV and 1.5mg  Ativan at [name].   On arrival here, he is very lethargic s/p Ativan. It is very difficult to obtain exam at this time.   NP, while in room, witnessed a 20 second episode of head turning side to side, abdominal spasm which resembled hiccup, and left upper thigh muscle spasm. Other noted activity was either head movement, or leg movement. Area of involvement vary. Other than 20 second episode above, the other episodes were less than 10 seconds and ceased. Little concern for status epilepticus at this time, but getting a stat EEG.   Patient was transferred to [name] ED and neurology asked to see in consult for these ? seizure like episodes.   ROS: Unable to obtain due to altered mental status.   Past Medical History:  Diagnosis Date   Adenocarcinoma of colon ([place])    Arthritis    gout   CKD (chronic kidney disease), stage III (HCC)    Dermatitis    Diverticulitis    Gout    no recent flare   Hemorrhoids    History of blood transfusion    Hypertension    Incontinence    Peripheral vascular disease ([place]) 2011   DVT right leg   Prostate carcinoma (HCC)    Pulmonary embolus (H[facility])    s/p hernia surgery 2012   Rosacea    Sciatica    Urinary retention    indwelling catheter   Wears dentures    upper    Wears glasses     Family History  Problem Relation Age of Onset    Prostate cancer Father     Social History:   reports that he quit smoking about 41 years ago. His smoking use included cigarettes and pipe. He has never used smokeless tobacco. He reports that he does not drink alcohol and does not use drugs.  Medications  Current Facility-Administered Medications:    levETIRAcetam (KEPPRA) IVPB 1000 mg/100 mL premix, 1,000 mg, Intravenous, Q12H, [name], MD, Stopped at 04/21/21 1242  Current Outpatient Medications:    acetaminophen (TYLENOL) 500 MG tablet, Take 1,000 mg by mouth every 8 (eight) hours as needed for mild pain., Disp: , Rfl:    allopurinol (ZYLOPRIM) 300 MG tablet, Take 300 mg by mouth every evening. , Disp: , Rfl:    aspirin EC 81 MG tablet, Take 81 mg by mouth daily., Disp: , Rfl:    carvedilol (COREG) 6.25 MG tablet, Take 6.25 mg by mouth 2 (two) times daily with a meal.  , Disp: , Rfl:    cephALEXin (KEFLEX) 500 MG capsule, Take 1 capsule (500 mg total) by mouth 4 (four) times daily., Disp: 40 capsule, Rfl: 0   cephALEXin (KEFLEX) 500 MG capsule, Take 1 capsule (500 mg total) by mouth 3 (three) times daily., Disp: 21 capsule, Rfl:  0   hydrochlorothiazide (HYDRODIURIL) 50 MG tablet, Take 50 mg by mouth every evening. , Disp: , Rfl:    Multiple Vitamin (MULTIVITAMIN WITH MINERALS) TABS tablet, Take 1 tablet by mouth every evening. , Disp: , Rfl:    potassium chloride SA (K-DUR,KLOR-CON) 20 MEQ tablet, Take 20 mEq by mouth every evening. , Disp: , Rfl: 2  Exam: Current vital signs: BP (!) 148/99   Pulse 76   Temp 98.2 F (36.8 C) (Oral)   Resp 10   Ht 5\' 10"  (1.778 m)   Wt 77.1 kg   SpO2 100%   BMI 24.39 kg/m  Vital signs in last 24 hours: Temp:  [98 F (36.7 C)-98.2 F (36.8 C)] 98.2 F (36.8 C) (06/30 1311) Pulse Rate:  [76-86] 76 (06/30 1314) Resp:  [10-25] 10 (06/30 1314) BP: (119-158)/(75-101) 148/99 (06/30 1314) SpO2:  [63 %-100 %] 100 % (06/30 1314) Weight:  [77.1 kg] 77.1 kg (06/30 1035)  PE: GENERAL:  Somnolent elderly male appears chronically ill.  HEENT: - Normocephalic and atraumatic. LUNGS - Normal respiratory effort.  CV - RRR on tele. ABDOMEN - Soft, nontender. Ext: warm, well perfused. Psych: affect calm. Lethargic.  NEURO:  Mental Status: Lethargic. Tells NP his first name, but answers no further orientation questions.  Speech/Language: Spoke only one word to NP.  Comprehension questionable but under the influence of Ativan. Can not name or repeat.   Cranial Nerves:  II: PERRL 8mm/brisk.  III, IV, VI: Eyes are conjugate.  V, VII: face is grossly symmetric.  VIII:hearing intact to voice. IX, X: Phonation normal.  XI: head is grossly midline.  XII: Will not open mouth.  Motor: Patient able to hold his RUE in air. LUE falls to bed. Does not move LEs to command, but spontaneous movement is noted.  Tone is normal. Bulk is normal.  Sensation- Intact to light touch bilaterally in all four extremities. Extinction absent to light touch to DSS.  Coordination: episodes of abnormal muscular movements as noted in HPI.  Gait- bedrest.   Labs  CBC    Component Value Date/Time   WBC 8.2 04/21/2021 1050   RBC 3.24 (L) 04/21/2021 1050   HGB 9.8 (L) 04/21/2021 1050   HCT 29.9 (L) 04/21/2021 1050   PLT 200 04/21/2021 1050   MCV 92.3 04/21/2021 1050   MCH 30.2 04/21/2021 1050   MCHC 32.8 04/21/2021 1050   RDW 15.0 04/21/2021 1050   LYMPHSABS 0.7 04/21/2021 1050   MONOABS 0.4 04/21/2021 1050   EOSABS 0.3 04/21/2021 1050   BASOSABS 0.0 04/21/2021 1050    CMP     Component Value Date/Time   NA 143 04/21/2021 1050   K 3.8 04/21/2021 1050   CL 110 04/21/2021 1050   CO2 25 04/21/2021 1050   GLUCOSE 90 04/21/2021 1050   GLUCOSE 97 08/13/2006 0955   BUN 38 (H) 04/21/2021 1050   CREATININE 2.07 (H) 04/21/2021 1050   CALCIUM 9.8 04/21/2021 1050   PROT 6.5 04/21/2021 1050   ALBUMIN 3.5 04/21/2021 1050   AST 16 04/21/2021 1050   ALT 12 04/21/2021 1050   ALKPHOS 73 04/21/2021  1050   BILITOT 0.6 04/21/2021 1050   GFRNONAA 30 (L) 04/21/2021 1050   GFRAA 50 (L) 01/27/2019 1019    Imaging MD reviewed the images obtained  CT head -No acute traumatic injury identified. -Stable non contrast CT appearance of the brain with generalized volume loss and chronic left temporal lobe encephalomalacia.  EEG 04/21/21  IMPRESSION: This  study is suggestive of mild to moderate diffuse encephalopathy, nonspecific etiology.  Two episodes were noted as described above without concomitant EEG change and were not epileptic.  No seizures or epileptiform discharges were seen throughout the recording.    Assessment: 87 yo male who presented today after being found down by wife. In ED, he began to have abnormal muscle movements with concern for seizure like activity. Patient received Ativan and load of Keppra 2 gms. It is difficult to clarify these movements at present, so we will get a stat EEG to see if movements can be captured. Also, dementia increases risk of seizures, so for now, will continue Keppra 500mg  po or IV q12 hrs.  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  Due to temporal lobe encephalomalacia, will obtain MRI brain when able to clarify anatomical abnormalities that may be associated with seizures (temporal lobe or hippocampi). Being found down could certainly contribute to his presentation, however, it was likely not for long, as his CK is normal. NP reviewed his medications and find no medication which could cause seizures or encephalopathy. Note that patient will be more prone to delirium because of infection and history of Alzheimer's.   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  -recurrent UTI with chronic Foley.  -AKI on CKD III.   Recommendations/Plan:  -Results of EEG as above.  -MRI brain w/o contrast -Keppra 500mg  IV or po q12 hours.  -Refrain from using Ativan as the jerky  movements are not seizures.  Please call neurology if he has recurrence of those frequently. -Suggest encephalopathy workup with TSH, urine culture, Vitamin B12 level.  -Check B12 levels and replace Vitamin B12 if result < 500.  -Treat metabolic derangements.  - seizure precautions.   -Delirium precautions. Key messages Communicate clearly and address sensory impairment Minimise the patient's confusion Encourage mobility and self-care Optimise nutrition, hydration and regular continence Minimise risk of injury and agitation Minimise use of antipsychotic medications Monitor and respond to pain Use night-time strategies to promote sleep  -Avoid medications on the [place] list for the elderly. [email or web address]://jordan-chavez.org/ -Neurology will continue to follow patient.   Patient seen by Clance Boll, NP/Neuro and later by MD. Note/plan to be edited by MD as needed.  Pager: [phone]  Attending Neurohospitalist Addendum Patient seen and examined with APP/Resident. Agree with the history and physical as documented above. Agree with the plan as documented, which I helped formulate. I have independently reviewed the chart, obtained history, review of systems and examined the patient.I have personally reviewed pertinent head/neck/spine imaging (CT/MRI). Please feel free to call with any questions.  -- Amie Portland, MD Neurologist [facility] Pager: [phone]

## 2021-04-21 NOTE — ED Notes (Signed)
EEG remains in progress.

## 2021-04-21 NOTE — ED Notes (Signed)
[facility] charge RN notified or ED to ED transfer

## 2021-04-21 NOTE — ED Notes (Signed)
EEG in progress at bedside

## 2021-04-21 NOTE — Procedures (Signed)
Patient Name: [name]  MRN: [id number]  Epilepsy Attending: Lora Havens  Referring Physician/Provider: Clance Boll, NP Date: 04/21/2021 Duration: 24.45 mins  Patient history: 87yo M with seizure like activity. EEG to evaluate for seizure.   Level of alertness: Awake, asleep  AEDs during EEG study: LEV  Technical aspects: This EEG study was done with scalp electrodes positioned according to the 10-20 International system of electrode placement. Electrical activity was acquired at a sampling rate of 500Hz  and reviewed with a high frequency filter of 70Hz  and a low frequency filter of 1Hz . EEG data were recorded continuously and digitally stored.   Description: No clear posterior dominant rhythm was seen. Sleep was characterized by vertex waves, sleep spindles (12 to 14 Hz), maximal frontocentral region.  EEG showed continuous 5 to 7 Hz theta slowing.  Two episodes were noted at  1445 and 1458 during which patient was noted to have side-to-side head movement. Concomitant EEG before, during and after the event did not show any EEG changes suggest seizure. Hyperventilation and photic stimulation were not performed.     ABNORMALITY - Continuous slow, generalized  IMPRESSION: This study is suggestive of mild to moderate diffuse encephalopathy, nonspecific etiology.  Two episodes were noted as described above without concomitant EEG change and were not epileptic.  No seizures or epileptiform discharges were seen throughout the recording.  Janvi Ammar Barbra Sarks

## 2021-04-21 NOTE — ED Notes (Signed)
ED Provider at bedside and neuro provider at bedside.

## 2021-04-21 NOTE — ED Triage Notes (Signed)
Brought from [place] for EEG. Was found unresponsive on ground this morning by wife (see note). Has Alzheimer's. Dx with UTI given antibiotic and Keppra for ? Seizure activity. Also given 1.5 mg ativan prior to CT.

## 2021-04-21 NOTE — H&P (Signed)
History and Physical    [name] NUU:[id number] DOB: 07/05/1931 DOA: 04/21/2021  Referring MD/NP/PA: Dorie Rank, MD PCP: Haywood Pao, MD  Patient coming from: Home(lives with wife) via EMS  Chief Complaint: Found on the floor  I have personally briefly reviewed patient's old medical records in [place]   HPI: [name] is a 87 y.o. male with medical history significant of hypertension, h/o DVT/PE, CKD stage III, Alzheimer's dementia, recurrent prostate cancer  presents after being found on the floor in the the kitchen.  History is obtained from the patient's wife as she reports that he has Alzheimer's dementia and does not currently recall what happened.  This morning she states that the patient got up before she did and after she woke up around 8:30 AM as when she found him lying on the floor.  She reports that he was awake, denied any trauma to his head, or any complaints of pain.  He asked for pillow for his head, stated he had been on the ground approximately 20 to 30 minutes prior to her finding him, and stated that he was unable to get up on his own.  She tried to call nearby neighbors, but they were not able to help her get him up and they ultimately had to call EMS.  She reports during this time she also noticed that his left leg was shaking.  She notes that he normally takes his own medicines and he may have not been taking them over the last week.  He had previously been seen in the emergency department after he passed out at church and was found to have a urinary tract infection at that time.  Review of records note patient was ultimately sent home on cephalexin.  Cultures grew out Citrobacter freundii that was resistant to cefazolin and ceftriaxone.  Last noted that the patient's left leg was spasming and they reported an odor to his urine.  EKG was noted to be irregular but no history of atrial fibrillation.  ED Course: Upon admission into the emergency  department patient was seen to be afebrile, respirations 10-25, blood pressures 119/86-158/75, and O2 saturation maintained on room air. Labs were significant for hemoglobin 9.8, BUN 38, creatinine 2.07, lactic acid 1.2,  and CK 145.  Patient had indwelling Foley catheter and urinalysis was positive for large leukocytes, positive nitrates, few bacteria, and greater than 50 WBCs.  Blood and urine cultures were obtained.  CT scan of the brain showed no acute abnormality.  Patient has been started on empiric antibiotics of Rocephin.  Neurology have been formally consulted due to concern of possible seizure-like activity.  Patient had been loaded with Keppra 1000 mg IV   Review of Systems  Unable to perform ROS: Mental status change  All other systems reviewed and are negative.  Past Medical History:  Diagnosis Date   Adenocarcinoma of colon ([place])    Arthritis    gout   CKD (chronic kidney disease), stage III ([place])    Dermatitis    Diverticulitis    Gout    no recent flare   Hemorrhoids    History of blood transfusion    Hypertension    Incontinence    Peripheral vascular disease ([place]) 2011   DVT right leg   Prostate carcinoma [facility])    Pulmonary embolus ([place])    s/p hernia surgery 2012   Rosacea    Sciatica    Urinary retention    indwelling catheter  Wears dentures    upper    Wears glasses     Past Surgical History:  Procedure Laterality Date   COLON SURGERY     COLONOSCOPY N/A 11/17/2013   Procedure: COLONOSCOPY;  Surgeon: Jeryl Columbia, MD;  Location: [facility] ENDOSCOPY;  Service: Endoscopy;  Laterality: N/A;   cyst removal L neck/shoulder     CYSTOSCOPY W/ RETROGRADES Bilateral 08/16/2018   Procedure: CYSTOSCOPY WITH BILATERAL  RETROGRADE PYELOGRAM;  Surgeon: Lucas Mallow, MD;  Location: [facility] ORS;  Service: Urology;  Laterality: Bilateral;   diverticulitis     EYE SURGERY Bilateral    cataract extraction with IOL   hemorrhoids   1961, [place]    inguinal   HOT HEMOSTASIS N/A 11/17/2013   Procedure: HOT HEMOSTASIS (ARGON PLASMA COAGULATION/BICAP);  Surgeon: Jeryl Columbia, MD;  Location: [facility];  Service: Endoscopy;  Laterality: N/A;   ILEOSTOMY     LAPAROSCOPIC LYSIS OF ADHESIONS N/A 01/06/2014   Procedure: LAPAROSCOPIC LYSIS OF ADHESIONS;  Surgeon: Odis Hollingshead, MD;  Location: [facility];  Service: General;  Laterality: N/A;   LAPAROSCOPIC PARTIAL COLECTOMY N/A 01/06/2014   Procedure: LAPAROSCOPIC ASSISTED PARTIAL COLECTOMY;  Surgeon: Odis Hollingshead, MD;  Location: [facility];  Service: General;  Laterality: N/A;   PROSTATE REMOVED  1992   SHOULDER SURGERY     L bursa shoulder removal 2012    TONSILLECTOMY     TRANSURETHRAL RESECTION OF BLADDER TUMOR N/A 08/16/2018   Procedure: TRANSURETHRAL RESECTION OF BLADDER TUMOR (TURBT);  Surgeon: Lucas Mallow, MD;  Location: [facility];  Service: Urology;  Laterality: N/A;   TRANSURETHRAL RESECTION OF BLADDER TUMOR WITH MITOMYCIN-C N/A 02/03/2019   Procedure: TRANSURETHRAL RESECTION OF BLADDER TUMOR WITH GEMCITABINE;  Surgeon: Lucas Mallow, MD;  Location: [facility];  Service: Urology;  Laterality: N/A;   upper teeth removal  2018     reports that he quit smoking about 41 years ago. His smoking use included cigarettes and pipe. He has never used smokeless tobacco. He reports that he does not drink alcohol and does not use drugs.  Allergies  Allergen Reactions   Sulfa Antibiotics Rash   Sulfonamide Derivatives Rash    Family History  Problem Relation Age of Onset   Prostate cancer Father     Prior to Admission medications   Medication Sig Start Date End Date Taking? Authorizing Provider  acetaminophen (TYLENOL) 500 MG tablet Take 1,000 mg by mouth every 8 (eight) hours as needed for mild pain.    [provider]  allopurinol (ZYLOPRIM) 300 MG tablet Take 300 mg by mouth every evening.     [provider]  aspirin EC 81 MG tablet Take 81 mg by mouth daily.     [provider]  carvedilol (COREG) 6.25 MG tablet Take 6.25 mg by mouth 2 (two) times daily with a meal.      [provider]  cephALEXin (KEFLEX) 500 MG capsule Take 1 capsule (500 mg total) by mouth 4 (four) times daily. 11/08/20   Deno Etienne, DO  cephALEXin (KEFLEX) 500 MG capsule Take 1 capsule (500 mg total) by mouth 3 (three) times daily. 02/20/21   Lacretia Leigh, MD  hydrochlorothiazide (HYDRODIURIL) 50 MG tablet Take 50 mg by mouth every evening.     [provider]  Multiple Vitamin (MULTIVITAMIN WITH MINERALS) TABS tablet Take 1 tablet by mouth every evening.     [provider]  potassium chloride SA (K-DUR,KLOR-CON) 20 MEQ tablet Take 20 mEq by mouth every evening.  07/13/18   [provider]    Physical Exam:  Constitutional: Elderly male who appears to be lethargic, but in no acute distress Vitals:   04/21/21 1200 04/21/21 1215 04/21/21 1311 04/21/21 1314  BP: (!) 146/88 136/80 (!) 135/91 (!) 148/99  Pulse: 84 77 79 76  Resp: 10 16 12 10   Temp:   98.2 F (36.8 C)   TempSrc:   Oral   SpO2: 100% 100% 100% 100%  Weight:      Height:       Eyes: PERRL, lids and conjunctivae normal ENMT: Mucous membranes are moist. Posterior pharynx clear of any exudate or lesions. Hard of hearing.  Patient does not have hearing aids in place Neck: normal, supple, no masses, no thyromegaly Respiratory: clear to auscultation bilaterally, no wheezing, no crackles. Normal respiratory effort. No accessory muscle use.  Cardiovascular: Irregular, no murmurs / rubs / gallops. No extremity edema. 2+ pedal pulses. No carotid bruits.  Abdomen: no tenderness, no masses palpated. No hepatosplenomegaly. Bowel sounds positive.  Musculoskeletal: no clubbing / cyanosis. No joint deformity upper and lower extremities. Good ROM, no contractures. Normal muscle tone.  Skin: no rashes, lesions, ulcers. No induration Neurologic: CN 2-12 grossly intact. Sensation intact,  DTR normal. Strength 5/5 in all 4.  Psychiatric: Poor memory.  Patient alert and oriented to self and knows that he is in [place].    Labs on Admission: I have personally reviewed following labs and imaging studies  CBC: Recent Labs  Lab 04/21/21 1050  WBC 8.2  NEUTROABS 6.8  HGB 9.8*  HCT 29.9*  MCV 92.3  PLT 194   Basic Metabolic Panel: Recent Labs  Lab 04/21/21 1050  NA 143  K 3.8  CL 110  CO2 25  GLUCOSE 90  BUN 38*  CREATININE 2.07*  CALCIUM 9.8   GFR: Estimated Creatinine Clearance: 26 mL/min (A) (by C-G formula based on SCr of 2.07 mg/dL (H)). Liver Function Tests: Recent Labs  Lab 04/21/21 1050  AST 16  ALT 12  ALKPHOS 73  BILITOT 0.6  PROT 6.5  ALBUMIN 3.5   No results for input(s): LIPASE, AMYLASE in the last 168 hours. No results for input(s): AMMONIA in the last 168 hours. Coagulation Profile: No results for input(s): INR, PROTIME in the last 168 hours. Cardiac Enzymes: Recent Labs  Lab 04/21/21 1050  CKTOTAL 145   BNP (last 3 results) No results for input(s): PROBNP in the last 8760 hours. HbA1C: No results for input(s): HGBA1C in the last 72 hours. CBG: Recent Labs  Lab 04/21/21 1037  GLUCAP 81   Lipid Profile: No results for input(s): CHOL, HDL, LDLCALC, TRIG, CHOLHDL, LDLDIRECT in the last 72 hours. Thyroid Function Tests: No results for input(s): TSH, T4TOTAL, FREET4, T3FREE, THYROIDAB in the last 72 hours. Anemia Panel: No results for input(s): VITAMINB12, FOLATE, FERRITIN, TIBC, IRON, RETICCTPCT in the last 72 hours. Urine analysis:    Component Value Date/Time   COLORURINE YELLOW 04/21/2021 1040   APPEARANCEUR CLOUDY (A) 04/21/2021 1040   LABSPEC 1.013 04/21/2021 1040   PHURINE 6.0 04/21/2021 1040   GLUCOSEU NEGATIVE 04/21/2021 1040   HGBUR LARGE (A) 04/21/2021 1040   BILIRUBINUR NEGATIVE 04/21/2021 1040   KETONESUR NEGATIVE 04/21/2021 1040   PROTEINUR 100 (A) 04/21/2021 1040   UROBILINOGEN 0.2 01/15/2015 1435    NITRITE POSITIVE (A) 04/21/2021 1040   LEUKOCYTESUR LARGE (A) 04/21/2021 1040   Sepsis  Labs: Recent Results (from the past 240 hour(s))  Resp Panel by RT-PCR (Flu A&B, Covid) Nasopharyngeal Swab     Status: None   Collection Time: 04/21/21 11:58 AM   Specimen: Nasopharyngeal Swab; Nasopharyngeal(NP) swabs in vial transport medium  Result Value Ref Range Status   SARS Coronavirus 2 by RT PCR NEGATIVE NEGATIVE Final    Comment: (NOTE) SARS-CoV-2 target nucleic acids are NOT DETECTED.  The SARS-CoV-2 RNA is generally detectable in upper respiratory specimens during the acute phase of infection. The lowest concentration of SARS-CoV-2 viral copies this assay can detect is 138 copies/mL. A negative result does not preclude SARS-Cov-2 infection and should not be used as the sole basis for treatment or other patient management decisions. A negative result may occur with  improper specimen collection/handling, submission of specimen other than nasopharyngeal swab, presence of viral mutation(s) within the areas targeted by this assay, and inadequate number of viral copies(<138 copies/mL). A negative result must be combined with clinical observations, patient history, and epidemiological information. The expected result is Negative.  Fact Sheet for Patients:  EntrepreneurPulse.com.au  Fact Sheet for Healthcare Providers:  IncredibleEmployment.be  This test is no t yet approved or cleared by the [place] FDA and  has been authorized for detection and/or diagnosis of SARS-CoV-2 by FDA under an Emergency Use Authorization (EUA). This EUA will remain  in effect (meaning this test can be used) for the duration of the COVID-19 declaration under Section 564(b)(1) of the Act, 21 U.S.C.section 360bbb-3(b)(1), unless the authorization is terminated  or revoked sooner.       Influenza A by PCR NEGATIVE NEGATIVE Final   Influenza B by PCR NEGATIVE NEGATIVE  Final    Comment: (NOTE) The Xpert Xpress SARS-CoV-2/FLU/RSV plus assay is intended as an aid in the diagnosis of influenza from Nasopharyngeal swab specimens and should not be used as a sole basis for treatment. Nasal washings and aspirates are unacceptable for Xpert Xpress SARS-CoV-2/FLU/RSV testing.  Fact Sheet for Patients: EntrepreneurPulse.com.au  Fact Sheet for Healthcare Providers: IncredibleEmployment.be  This test is not yet approved or cleared by the [place] FDA and has been authorized for detection and/or diagnosis of SARS-CoV-2 by FDA under an Emergency Use Authorization (EUA). This EUA will remain in effect (meaning this test can be used) for the duration of the COVID-19 declaration under Section 564(b)(1) of the Act, 21 U.S.C. section 360bbb-3(b)(1), unless the authorization is terminated or revoked.  Performed at [facility], [place] [address], [place], [place] [address]      Radiological Exams on Admission: CT Head Wo Contrast  Result Date: 04/21/2021 CLINICAL DATA:  87 year old male found down. EXAM: CT HEAD WITHOUT CONTRAST TECHNIQUE: Contiguous axial images were obtained from the base of the skull through the vertex without intravenous contrast. COMPARISON:  Head CT 11/08/2020. FINDINGS: Brain: Stable cerebral volume loss. Chronic encephalomalacia in the left inferior temporal lobe is stable. No midline shift, ventriculomegaly, mass effect, evidence of mass lesion, intracranial hemorrhage or evidence of cortically based acute infarction. Stable gray-white matter differentiation throughout the brain. Vascular: Mild Calcified atherosclerosis at the skull base. Intracranial artery dolichoectasia. No suspicious intracranial vascular hyperdensity. Skull: Stable and intact. Sinuses/Orbits: Visualized paranasal sinuses and mastoids are stable and well aerated. Other: Several chronic surgical clips along the right  posterior scalp convexity are stable. No acute orbit or scalp soft tissue injury identified. IMPRESSION: 1. No acute traumatic injury identified. 2. Stable non contrast CT appearance of the brain with generalized volume loss  and chronic left temporal lobe encephalomalacia. Electronically Signed   By: Genevie Ann M.D.   On: 04/21/2021 11:30   DG Pelvis Portable  Result Date: 04/21/2021 CLINICAL DATA:  87 year old male found down. EXAM: PORTABLE PELVIS 1-2 VIEWS COMPARISON:  CT Abdomen and Pelvis 08/09/2018. FINDINGS: Portable AP supine view at 1054 hours. Femoral heads are normally located. Pelvis appears stable and intact. Bilateral pelvic sidewall surgical clips are stable. SI joints appear symmetric. Grossly intact proximal femurs. No acute osseous abnormality identified. Negative visible lower abdominal and pelvic visceral contours. IMPRESSION: No acute fracture or dislocation identified about the pelvis. Electronically Signed   By: Genevie Ann M.D.   On: 04/21/2021 11:28   DG Chest Portable 1 View  Result Date: 04/21/2021 CLINICAL DATA:  Fall. EXAM: PORTABLE CHEST 1 VIEW COMPARISON:  11/08/2020. FINDINGS: Cardiomegaly. No pulmonary venous congestion. Low lung volumes. Lungs are clear. No pleural effusion or pneumothorax. Right skin fold noted. Degenerative changes scoliosis thoracic spine. IMPRESSION: 1.  Cardiomegaly.  No pulmonary venous congestion. 2.  Low lung volumes.  No acute pulmonary disease. Electronically Signed   By: Marcello Moores  Register   On: 04/21/2021 11:26    EKG: Independently reviewed.  Sinus rhythm 100 bpm with sinus pause and ventricular bigeminy  Assessment/Plan Acute metabolic encephalopathy seizure-like activity: Patient presents after being found down at home and was noted to have jerking of his left lower leg.  CT scan of the head showed no acute abnormalities. He had been given Keppra 1000 mg IV while in the ED.  Seen by neurology and thought polymyoclonus and not seizures.  Neurology  advised not to use Ativan. -Admit to a medical telemetry bed -Seizure precautions -Delirium precautions -Neurochecks -Follow-up EEG -Check MRI of the brain -Keppra 500 mg IV twice daily -Follow-up vitamin B12 and TSH -Appreciate neurology consultative services, will follow for any additional recommendation  Complicated urinary tract infection with chronic indwelling Foley cath: Acute.  On admission urinalysis was significant for large leukocytes with positive nitrites, few bacteria, greater than 50 WBCs.  Recent urine culture showed Citrobacter in May that was resistant to Rocephin and cephalexin which patient had been given.  Patient was started on empiric antibiotics of Rocephin. -Follow-up urine culture -Zosyn per pharmacy given recent cultures that were resistant to Rocephin and cephalexin -Will need to change out catheter at some point in time  Suspect acute kidney injury superimposed chronic kidney disease stage IIIb: Patient presents with creatinine of 2.07 with BUN 38.  Patient baseline creatinine previously noted to be around 1.7. -Normal saline IV fluids at 75 mL/h -Avoid nephrotoxic agents at this time -Recheck kidney function in a.m.  Fall at home vs syncope arrhythmia: It is not totally clear how the patient ended up on the ground this morning.  In the ED patient was noted to have sinus pause and ventricular bigeminy on EKG.  He also has a prior history of DVT/PE. -Check D-dimer and consider VQ scan if positive -Follow-up telemetry overnight.  Patient may need to have echocardiogram and/or formal consult to cardiology -PT to eval and treat in a.m.  Anemia of chronic disease: Hemoglobin 9.8 which appears slightly improved from previous 8.3 g/dL on 5/1.  No reports of bleeding.-Continue to monitor H&H  Alzheimer's dementia: Patient not on any medications for treatment. -Delirum Precautions  Essential hypertension: Home blood pressure medications include Coreg 6.25 mg twice  daily and hydrochlorothiazide 50 mg daily. -Held hydrochlorothiazide due to AKI -Continue Coreg in a.m. if MRI negative  History of DVT/PE: Previously not to have been provoked after surgery.  History of prostate cancer: Patient s/p retropubic prostatectomy in 1992 and external beam radiation therapy in 2002is followed by Dr. Gloriann Loan of urology in the outpatient setting.   DVT prophylaxis: Heparin Code Status: Full Family Communication: Wife updated over the phone Disposition Plan: To be determined Consults called: Neurology Admission status: Inpatient, require more than 2 midnight stay  Norval Morton MD [facility]   If 7PM-7AM, please contact night-coverage   04/21/2021, 1:39 PM

## 2021-04-21 NOTE — ED Notes (Signed)
[facility] called for transport 

## 2021-04-21 NOTE — Progress Notes (Signed)
EEG Completed; Results Pending  

## 2021-04-21 NOTE — ED Provider Notes (Signed)
Pt sedated right now.  S/p ativan.   Neurology has evaluated pt.  Will proceed with stat EEG.  I will consult hospitalist for [place].   Dorie Rank, MD 04/21/21 1345

## 2021-04-22 ENCOUNTER — Inpatient Hospital Stay (HOSPITAL_COMMUNITY): Payer: Medicare Other

## 2021-04-22 DIAGNOSIS — G253 Myoclonus: Secondary | ICD-10-CM

## 2021-04-22 LAB — BASIC METABOLIC PANEL
Anion gap: 10 (ref 5–15)
BUN: 30 mg/dL — ABNORMAL HIGH (ref 8–23)
CO2: 22 mmol/L (ref 22–32)
Calcium: 9.4 mg/dL (ref 8.9–10.3)
Chloride: 109 mmol/L (ref 98–111)
Creatinine, Ser: 1.95 mg/dL — ABNORMAL HIGH (ref 0.61–1.24)
GFR, Estimated: 33 mL/min — ABNORMAL LOW (ref >60)
Glucose, Bld: 81 mg/dL (ref 70–99)
Potassium: 3.5 mmol/L (ref 3.5–5.1)
Sodium: 141 mmol/L (ref 135–145)

## 2021-04-22 LAB — CBC
HCT: 29.5 % — ABNORMAL LOW (ref 39.0–52.0)
Hemoglobin: 9.7 g/dL — ABNORMAL LOW (ref 13.0–17.0)
MCH: 30 pg (ref 26.0–34.0)
MCHC: 32.9 g/dL (ref 30.0–36.0)
MCV: 91.3 fL (ref 80.0–100.0)
Platelets: 192 10*3/uL (ref 150–400)
RBC: 3.23 MIL/uL — ABNORMAL LOW (ref 4.22–5.81)
RDW: 14.8 % (ref 11.5–15.5)
WBC: 7.6 10*3/uL (ref 4.0–10.5)
nRBC: 0 % (ref 0.0–0.2)

## 2021-04-22 MED ORDER — SODIUM CHLORIDE 0.9 % IV SOLN: INTRAVENOUS | Status: AC

## 2021-04-22 MED ORDER — TECHNETIUM TO 99M ALBUMIN AGGREGATED: 4.2000 | Freq: Once | INTRAVENOUS | Status: DC | PRN

## 2021-04-22 MED ORDER — CYANOCOBALAMIN 1000 MCG/ML IJ SOLN
1000.0000 ug | Freq: Every day | INTRAMUSCULAR | Status: DC
  Administered 2021-04-22 – 2021-04-25 (×4): 1000 ug via INTRAMUSCULAR
  Filled 2021-04-22 (×4): qty 1

## 2021-04-22 MED ORDER — CHLORHEXIDINE GLUCONATE CLOTH 2 % EX PADS
6.0000 | MEDICATED_PAD | Freq: Every day | CUTANEOUS | Status: DC
  Administered 2021-04-23 – 2021-04-27 (×5): 6 via TOPICAL

## 2021-04-22 NOTE — Evaluation (Signed)
Physical Therapy Evaluation Patient Details Name: [name] MRN: [id number] DOB: 28-May-1931 Today's Date: 04/22/2021   History of Present Illness  87 yo male presents to [facility] on [facility]/30 after pt found down at home by wife. 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 PMH includes Alzheimer's dementia, HTN, HLD, CKD III, bladder tumor s/p TURB, urinary retention with chronic Foley, gout, colon cancer, and PVD.  Clinical Impression   Pt presents with debility, impaired cognition worse than baseline per chart review, max difficulty performing mobility tasks, poor balance, and decreased activity tolerance. Pt to benefit from acute PT to address deficits. Pt requiring max assist for bed mobility and x2 transfers to standing, pt very unsteady in static standing and very poor direction following so mobility not progressed. When pt returned to supine, pt with episode of LLE, abdominal twitching with head turning L and R. Pt conversive during this when prompted, no abnormal eye movements noted. Event lasted <1 minute, RN notified. PT to progress mobility as tolerated, and will continue to follow acutely.      Follow Up Recommendations SNF;Supervision/Assistance - 24 hour    Equipment Recommendations  None recommended by PT    Recommendations for Other Services       Precautions / Restrictions Precautions Precautions: Fall Restrictions Weight Bearing Restrictions: No      Mobility  Bed Mobility Overal bed mobility: Needs Assistance Bed Mobility: Supine to Sit;Sit to Supine     Supine to sit: Max assist Sit to supine: Max assist   General bed mobility comments: max assist for supine<>sit for trunk and LE management, boost up in bed with bed pads and flat HOB. Verbal cuing for sequencing, given cognitive impairment pt requiring increased physical assist for mobility.    Transfers Overall transfer level: Needs assistance Equipment used: 1 person hand  held assist Transfers: Sit to/from Stand Sit to Stand: Max assist         General transfer comment: Max assist for power up, rise, steady, pt reaching for tray table and bedrails to come to standing. Once standing, pt states "there's something wet on my arm" and reaches for IV, unsteadiness requiring return to sitting. STS x2, from EOB.  Ambulation/Gait             General Gait Details: NT - unsafe to try with +1 and given current assist levels  Stairs            Wheelchair Mobility    Modified Rankin (Stroke Patients Only)       Balance Overall balance assessment: Needs assistance Sitting-balance support: No upper extremity supported;Feet supported Sitting balance-Leahy Scale: Fair Sitting balance - Comments: able to sit EOB statically without support, requires posterior support with dynamic activity (reaching, scooting) Postural control: Posterior lean Standing balance support: Bilateral upper extremity supported Standing balance-Leahy Scale: Zero Standing balance comment: max assist to rise                             Pertinent Vitals/Pain Pain Assessment: Faces Faces Pain Scale: No hurt Pain Intervention(s): Monitored during session    Home Living Family/patient expects to be discharged to:: Private residence Living Arrangements: Spouse/significant other Available Help at Discharge: Family Type of Home: House                Prior Function Level of Independence: Needs assistance   Gait / Transfers Assistance Needed: unsure of pt mobility level  and assist level, pt unable to answer questiosn regarding PLOF  ADL's / Homemaking Assistance Needed: unsure, family not at bedside and pt unable to answer questions regarding PLOF        Hand Dominance   Dominant Hand: Right    Extremity/Trunk Assessment   Upper Extremity Assessment Upper Extremity Assessment: Defer to OT evaluation    Lower Extremity Assessment Lower Extremity  Assessment: Generalized weakness    Cervical / Trunk Assessment Cervical / Trunk Assessment: Normal  Communication   Communication: No difficulties  Cognition Arousal/Alertness: Awake/alert Behavior During Therapy: Restless;Impulsive Overall Cognitive Status: History of cognitive impairments - at baseline Area of Impairment: Orientation;Attention;Memory;Following commands;Safety/judgement;Problem solving                 Orientation Level: Disoriented to;Place;Situation Current Attention Level: Focused Memory: Decreased short-term memory Following Commands: Follows one step commands inconsistently Safety/Judgement: Decreased awareness of safety;Decreased awareness of deficits   Problem Solving: Difficulty sequencing;Requires verbal cues;Requires tactile cues General Comments: Pt states we are at his aunt's house, after PT orients pt to hospital and situation of admission, pt states "well I guess I have to ask you a favor, go get my car from my brother, a [address], in [place]".      General Comments General comments (skin integrity, edema, etc.): When pt returned to supine, pt with episode of LLE, abdominal twitching with head turning L and R. Pt conversive during this when prompted, no abnormal eye movements noted. Event lasted <1 minute, RN notified.    Exercises     Assessment/Plan    PT Assessment Patient needs continued PT services  PT Problem List Decreased strength;Decreased mobility;Decreased activity tolerance;Decreased balance;Decreased knowledge of use of DME;Pain;Decreased safety awareness;Impaired tone;Decreased coordination;Decreased range of motion;Decreased cognition       PT Treatment Interventions DME instruction;Therapeutic activities;Gait training;Therapeutic exercise;Patient/family education;Balance training;Functional mobility training;Neuromuscular re-education    PT Goals (Current goals can be found in the Care Plan section)  Acute Rehab PT  Goals PT Goal Formulation: Patient unable to participate in goal setting Time For Goal Achievement: 05/06/21 Potential to Achieve Goals: Fair    Frequency Min 2X/week   Barriers to discharge        Co-evaluation               AM-PAC PT "6 Clicks" Mobility  Outcome Measure Help needed turning from your back to your side while in a flat bed without using bedrails?: A Lot Help needed moving from lying on your back to sitting on the side of a flat bed without using bedrails?: A Lot Help needed moving to and from a bed to a chair (including a wheelchair)?: Total Help needed standing up from a chair using your arms (e.g., wheelchair or bedside chair)?: A Lot Help needed to walk in hospital room?: Total Help needed climbing 3-5 steps with a railing? : Total 6 Click Score: 9    End of Session   Activity Tolerance: Patient tolerated treatment well;Patient limited by fatigue Patient left: in bed;with call bell/phone within reach;with bed alarm set;Other (comment) (seizure pads placed, PT recommending mitts as pt pulling at IV, foley catheter) Nurse Communication: Mobility status PT Visit Diagnosis: Difficulty in walking, not elsewhere classified (R26.2);Muscle weakness (generalized) (M62.81);History of falling (Z91.81)    Time: 7106-2694 PT Time Calculation (min) (ACUTE ONLY): 19 min   Charges:   PT Evaluation $PT Eval Low Complexity: 1 Low         Beautiful Pensyl S, PT DPT Acute  Rehabilitation Services Pager [phone]  Office [phone]   Louis Matte 04/22/2021, 2:57 PM

## 2021-04-22 NOTE — Progress Notes (Signed)
Chronic foley removed, which was present on admission. 16 French inserted and documented

## 2021-04-22 NOTE — Progress Notes (Addendum)
Notified by Radiology of small amount on Subarachnoid hemorrhage noted on MRI and they asked for a repeat [place] at this time. MRI also shows punctate R cerebellar, R temporal and medial left frontal strokes.  Recs: - Repeat CTH order placed. I discontinued Aspirin and SQ Heparin for DVT prophylaxis for now. - Stroke workup ordered with MRA, Carotid duplex, TTE, LDL, HbA1c. - BP has been stable here, less than 140s as of right now. - MR angio over CT angio given elevated Creatinine compared to baseline this admission    [place] Pager Number [phone]

## 2021-04-22 NOTE — Progress Notes (Signed)
Patient's ABD appears to simulate a hiccup motion and head will slightly tremor , this occurred twice, When asking him if he was okay they movement stopped.

## 2021-04-22 NOTE — Progress Notes (Signed)
Neurology Progress Note  Brief HPI: 87 y.o. male with a PMHx of Alzheimer's dementia, HTN, HLD, CKD III, bladder tumor s/p TURB, urinary retention with chronic Foley, gout, colon cancer, and PVD. Patient presented to [facility] ED today after an unwitnessed fall at home. He had noted muscle spasm in the left leg on hospital arrival s/p Keppra load and Ativan.  On [facility] arrival, he had witnessed head turning, abdominal spasms, and left upper thigh spasms lasting approximately 20 seconds with concern for seizure-like activity. EEG with mild to moderate diffuse encephalopathy. Two episodes of abnormal movements noted during EEG evaluation without concomitant EEG change. EEG without seizures or epileptiform discharges.   Subjective: No acute overnight events EEG suggestive of mild to moderate diffuse encephalopathy, nonspecific etiology. Two episodes of side-to-side head movement noted during EEG without concomitant EEG change determined to be non-epileptic. No seizures or epileptiform discharges seen throughout EEG study.   Exam: Vitals:   04/22/21 0336 04/22/21 0748  BP: (!) 135/98 (!) 159/101  Pulse: 78 82  Resp: 20 18  Temp: 97.7 F (36.5 C) 98 F (36.7 C)  SpO2: 100% 100%   Gen: Laying comfortably in bed, in no acute distress Resp: non-labored breathing, no respiratory distress, on room air Abd: soft to palpation, non-tender, non-distended  Neuro: Mental Status: Awake, alert, and oriented to self, age, and year. He does have some evidence of confusion as he states initially that he is in a concrete building before identifying that he is in a hospital. He is unable to provide a clear and coherent history of present illness. He states that he has been sick lately and states that he has been "leaking" and "donating water". He incorrectly states that the month is March. He tells examiner that he lives at home with his mother and father.  Speech is intact without dysarthria or aphasia.  No neglect is  noted.  Patient is extremely hard of hearing.  He is able to follow some simple one-step commands with repeat instruction. Difficulty possibly 2/2 hearing impairment.  Cranial Nerves: PERRL 2 mm / brisk, EOMI with saccadic pursuits, visual fields are full without ptosis or nystagmus, sensation to face is intact and symmetric to light touch, face is symmetric resting and smiling, patient is extremely hard of hearing, shoulders elevate symmetrically, tongue protrudes midline, palate elevates symmetrically.  Motor: Right upper extremity 5/5 strength biceps, triceps, and grip strength. Left upper extremity strength 5/5 throughout without asymmetry noted through biceps, triceps, and grips but left upper extremity with consistent vertical drift on assessment.  There is no tremor at rest but there are noted dystonic appearing movements that are stimulus induced. With bilateral upper extremity elevation, patient experiences approximately 15 - 20 seconds of left thigh and neck / head twitching. He remains alert throughout the abnormal movements. With elevation of the left lower extremity, he has onset of left hip and left thigh dystonic appearing movements that last 20 - 30 seconds and disappear at rest.  Bilateral lower extremities 5/5 strength without vertical drift.  Tone and bulk are normal for patient's age.  Sensory: Sensation intact and symmetric to light touch in upper and lower extremities.  DTR: 2+ and symmetric patellae, 3+ and symmetric biceps and brachioradialis.  Gait: Deferred  Pertinent Labs: CBC    Component Value Date/Time   WBC 7.6 04/22/2021 0308   RBC 3.23 (L) 04/22/2021 0308   HGB 9.7 (L) 04/22/2021 0308   HCT 29.5 (L) 04/22/2021 0308   PLT 192 04/22/2021  0308   MCV 91.3 04/22/2021 0308   MCH 30.0 04/22/2021 0308   MCHC 32.9 04/22/2021 0308   RDW 14.8 04/22/2021 0308   LYMPHSABS 0.7 04/21/2021 1050   MONOABS 0.4 04/21/2021 1050   EOSABS 0.3 04/21/2021 1050   BASOSABS 0.0  04/21/2021 1050   CMP     Component Value Date/Time   NA 141 04/22/2021 0308   K 3.5 04/22/2021 0308   CL 109 04/22/2021 0308   CO2 22 04/22/2021 0308   GLUCOSE 81 04/22/2021 0308   GLUCOSE 97 08/13/2006 0955   BUN 30 (H) 04/22/2021 0308   CREATININE 1.95 (H) 04/22/2021 0308   CALCIUM 9.4 04/22/2021 0308   PROT 6.5 04/21/2021 1050   ALBUMIN 3.5 04/21/2021 1050   AST 16 04/21/2021 1050   ALT 12 04/21/2021 1050   ALKPHOS 73 04/21/2021 1050   BILITOT 0.6 04/21/2021 1050   GFRNONAA 33 (L) 04/22/2021 0308   GFRAA 50 (L) 01/27/2019 1019   Lab Results  Component Value Date   TSH 3.124 04/21/2021   Lab Results  Component Value Date   VITAMINB12 131 (L) 04/21/2021   Lactic Acid, Venous    Component Value Date/Time   LATICACIDVEN 1.2 04/21/2021 1050   Urinalysis    Component Value Date/Time   COLORURINE YELLOW 04/21/2021 1040   APPEARANCEUR CLOUDY (A) 04/21/2021 1040   LABSPEC 1.013 04/21/2021 1040   PHURINE 6.0 04/21/2021 1040   GLUCOSEU NEGATIVE 04/21/2021 1040   HGBUR LARGE (A) 04/21/2021 1040   BILIRUBINUR NEGATIVE 04/21/2021 1040   KETONESUR NEGATIVE 04/21/2021 1040   PROTEINUR 100 (A) 04/21/2021 1040   UROBILINOGEN 0.2 01/15/2015 1435   NITRITE POSITIVE (A) 04/21/2021 1040   LEUKOCYTESUR LARGE (A) 04/21/2021 1040   Results for orders placed or performed during the hospital encounter of 04/21/21  Culture, blood (routine x 2)     Status: None (Preliminary result)   Collection Time: 04/21/21 11:52 AM   Specimen: BLOOD  Result Value Ref Range Status   Specimen Description   Final    BLOOD RIGHT ANTECUBITAL Performed at [facility], [place] [address] [place], [place] [address]    Special Requests   Final    BOTTLES DRAWN AEROBIC AND ANAEROBIC Blood Culture adequate volume Performed at [facility] [address], [place], [place] [address]    Culture   Final    NO GROWTH < 24 HOURS Performed at [facility] [address], [place], [place] [address]    Report Status PENDING  Incomplete  Culture, blood (routine x 2)     Status: None (Preliminary result)   Collection Time: 04/21/21 11:52 AM   Specimen: BLOOD  Result Value Ref Range Status   Specimen Description   Final    BLOOD LEFT ANTECUBITAL Performed at [facility] [address] [place], [place] [address]    Special Requests   Final    BOTTLES DRAWN AEROBIC AND ANAEROBIC Blood Culture adequate volume Performed at [facility] [address], [place], [place] [address]    Culture   Final    NO GROWTH < 24 HOURS Performed at [facility] [address] [place], [place] [address]    Report Status PENDING  Incomplete  Resp Panel by RT-PCR (Flu A&B, Covid) Nasopharyngeal Swab     Status: None   Collection Time: 04/21/21 11:58 AM   Specimen: Nasopharyngeal Swab; Nasopharyngeal(NP) swabs in vial transport medium  Result Value  Ref Range Status   SARS Coronavirus 2 by RT PCR NEGATIVE NEGATIVE Final    Comment: (NOTE) SARS-CoV-2 target nucleic acids are NOT DETECTED.  The SARS-CoV-2 RNA is generally detectable in upper respiratory specimens during the acute phase of infection. The lowest concentration of SARS-CoV-2 viral copies this assay can detect is 138 copies/mL. A negative result does not preclude SARS-Cov-2 infection and should not be used as the sole basis for treatment or other patient management decisions. A negative result may occur with  improper specimen collection/handling, submission of specimen other than nasopharyngeal swab, presence of viral mutation(s) within the areas targeted by this assay, and inadequate number of viral copies(<138 copies/mL). A negative result must be combined with clinical observations, patient history, and epidemiological information. The expected result is Negative.  Fact Sheet for Patients:  EntrepreneurPulse.com.au  Fact Sheet  for Healthcare Providers:  IncredibleEmployment.be  This test is no t yet approved or cleared by the [place] FDA and  has been authorized for detection and/or diagnosis of SARS-CoV-2 by FDA under an Emergency Use Authorization (EUA). This EUA will remain  in effect (meaning this test can be used) for the duration of the COVID-19 declaration under Section 564(b)(1) of the Act, 21 U.S.C.section 360bbb-3(b)(1), unless the authorization is terminated  or revoked sooner.       Influenza A by PCR NEGATIVE NEGATIVE Final   Influenza B by PCR NEGATIVE NEGATIVE Final    Comment: (NOTE) The Xpert Xpress SARS-CoV-2/FLU/RSV plus assay is intended as an aid in the diagnosis of influenza from Nasopharyngeal swab specimens and should not be used as a sole basis for treatment. Nasal washings and aspirates are unacceptable for Xpert Xpress SARS-CoV-2/FLU/RSV testing.  Fact Sheet for Patients: EntrepreneurPulse.com.au  Fact Sheet for Healthcare Providers: IncredibleEmployment.be  This test is not yet approved or cleared by the [place] FDA and has been authorized for detection and/or diagnosis of SARS-CoV-2 by FDA under an Emergency Use Authorization (EUA). This EUA will remain in effect (meaning this test can be used) for the duration of the COVID-19 declaration under Section 564(b)(1) of the Act, 21 U.S.C. section 360bbb-3(b)(1), unless the authorization is terminated or revoked.  Performed at [facility], [place] [address] [place], [place] [address]    CK 04/21/2021: 145  Imaging Reviewed:  CT Head WO Contrast 04/21/2021: 1. No acute traumatic injury identified. 2. Stable non contrast CT appearance of the brain with generalized volume loss and chronic left temporal lobe encephalomalacia.  EEG impression from 04/21/2021: "This study is suggestive of mild to moderate diffuse encephalopathy, nonspecific  etiology.  Two episodes were noted as described above without concomitant EEG change and were not epileptic.  No seizures or epileptiform discharges were seen throughout the recording."   Assessment:  87 yo male who presented 04/21/2021 after being found down by wife. In ED, he began to have abnormal muscle movements with concern for seizure like activity. Patient received Ativan and load of Keppra 2,000 mg.  - Examination reveals patient with dystonic appearing movements of the left thigh, hip, trunk, head/neck area that are simulation-induced. The abnormal movements are not present at rest and last 20 - 30 seconds with movement. Patient is confused and hard of hearing as per baseline mental status.  - Patient was started on Keppra 500 mg BID as dementia increases seizure risk with initial concern for seizure-like activity.  - EEG suggestive of mild to moderate diffuse encephalopathy, nonspecific etiology. Abnormal side-to-side head movements captured on EEG  without concomitant EEG change. No seizures or epileptiform discharges were seen throughout routine EEG. - CT with evidence of chronic left temporal lobe encephalomalacia, though abnormal movements on assessment do not localize with chronic left temporal lobe encephalomalacia. MRI brain pending, when able, to clarify anatomical abnormalities that may be associated with seizures (temporal lobe or hippocampi). Being found down could certainly contribute to his presentation, however, it was likely not for long, as his CK is normal. Patient with nitrite and leukocyte positive urinary tract infection which is likely to contribute to patient's presentation. Vitamin B12 low, TSH within normal limits.  - Witnessed episodes are stimulation induced dystonic movements versus generalized polymyoclonus. On medication review, patient is not on medications known to cause dystonic reactions.   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 - Vitamin B12 deficiency-causing subacute combined degeneration - Lethargic, likely due to Ativan- resolved - Recurrent UTI with chronic Foley; Leukocyte and Nitrite positive urinary tract infection - AKI on CKD III.   Recommendations:  - Vitamin B12 supplementation  - Thiamine level pending - MRI brain w/o when able and MRI C-spine without contrast. - Continue Keppra 500 mg BID for now, pending MRI evaluation - Treatment of infectious / metabolic derangements per primary team - Avoid sedating medications - Consider low dose Seroquel 12.5 - 25 mg nightly PRN for agitation while monitoring QTc to reduce aggressive behavior, uptitrating slowly as needed. -Neurology will continue to follow  Anibal Henderson, AGACNP-BC [facility] [phone]  Attending addendum Patient seen and examined Imaging reviewed Agree with the history and physical and plan above that I have helped formulate. Discussed the plan with the attending hospitalist-Dr.Hongalgi  We will follow  -- Amie Portland, MD Neurologist [facility] Pager: [phone]

## 2021-04-22 NOTE — Progress Notes (Signed)
PROGRESS NOTE   [name]  LZJ:[id number]    DOB: 19-Aug-1931    DOA: 04/21/2021  PCP: Haywood Pao, MD   I have briefly reviewed patients previous medical records in [facility].  Chief Complaint  Patient presents with   Weakness   Altered Mental Status    Brief Narrative:  87 year old male with medical history of hypertension, hyperlipidemia, DVT/PE, CKD stage IIIb, Alzheimer's dementia, recurrent prostate cancer, bladder tumor s/p TURBT, urinary retention with chronic Foley, PAD, presented to the [facility] ED after an unwitnessed fall at home.  Wife found patient on the floor and could not get him up.  911 was called.  In the ED patient noted to have seizure-like activity/muscle spasm in left leg, received 1.5 mg of Ativan and loaded with 2 g of IV Keppra and transferred to [facility] ED for evaluation by neurology.  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  CT head without acute findings.  Awaiting MRI brain and C-spine to rule out acute stroke.  Ongoing B12 supplementation.  Thiamine level pending.  Continuing Keppra for now pending MRI.  Treating infectious/metabolic derangements as below.  Avoid sedative medications.  EEG consistent with mild to moderate diffuse encephalopathy.  B12 deficiency: B12 level: 131.  While hospitalized B12, 1000 mcg subcu daily x1 week, can switch to oral at time of discharge, then 1000 mcg orally daily.  Catheter  associated UTI/complicated UTI/recurrent UTI: Change Foley catheter 7/1.  Blood cultures x2: Negative to date.  Urine culture: >100 K colonies of gram-negative rods-follow final results.  Continue empirically started IV Zosyn.  Previous urine culture showed Citrobacter resistant to ceftriaxone and cephalexin.  Acute metabolic encephalopathy: Likely related to Ativan that he received in the ED.  Seems to have resolved.  Has underlying dementia.  Monitor.  Avoid sedative medications.  Delirium precautions. TSH normal.  Acute kidney injury complicating stage IIIb CKD: Baseline creatinine not clearly known but may be in the one-point 8-2 range.  Presented with creatinine of 2.07.  Improved to 1.95.  Brief hydration and follow BMP  Fall at home: Details not known.  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  Lack of adequate history.  In the ED reportedly noted to have sinus pause and ventricular bigeminy on EKG.  Telemetry shows sinus rhythm with frequent PACs.  D-dimer elevated.  VQ scan negative.  Continue telemetry.  Anemia of chronic disease: Stable  Alzheimer's dementia: No behavioral abnormalities currently.  Essential hypertension: Holding HCTZ due to AKI.  Resume carvedilol.  History of DVT/PE: Reportedly provoked after surgery.  VQ scan negative this admission.  History of prostate cancer: Follows with Dr. Gloriann Loan, urology.  Body mass index is 24.39 kg/m.   DVT prophylaxis: heparin injection 5,000 Units Start: 04/21/21 1430     Code Status: Full Code Family Communication: None at bedside. Disposition:  Status is: Inpatient  Remains inpatient appropriate because:Inpatient level of care appropriate due to severity of illness  Dispo: The patient is from: Home  Anticipated d/c is to: SNF              Patient currently is not medically stable to d/c.   Difficult to place patient No        Consultants:    Neurology  Procedures:   Change of Foley catheter 7/1  Antimicrobials:    Anti-infectives (From admission, onward)    Start     Dose/Rate Route Frequency Ordered Stop   04/21/21 2300  piperacillin-tazobactam (ZOSYN) IVPB 3.375 g        3.375 g 12.5 mL/hr over 240 Minutes Intravenous Every 8 hours 04/21/21 1434     04/21/21 1500  piperacillin-tazobactam (ZOSYN) IVPB 3.375 g        3.375 g 100 mL/hr over 30 Minutes Intravenous  Once 04/21/21 1434 04/21/21 1641   04/21/21 1115  cefTRIAXone (ROCEPHIN) 1 g in sodium chloride 0.9 % 100 mL IVPB        1 g 200 mL/hr over 30 Minutes Intravenous  Once 04/21/21 1106 04/21/21 1242         Subjective:  Patient states that he is in the hospital "because something wrong with his legs".  Poor historian.  Alert and oriented to self, place and year.  Follows simple instructions.  Per RN, home Foley catheter leaking.  Objective:   Vitals:   04/22/21 0047 04/22/21 0336 04/22/21 0748 04/22/21 1126  BP: (!) 142/115 (!) 135/98 (!) 159/101 (!) 129/94  Pulse: 85 78 82 91  Resp: 17 20 18 18   Temp: 98.1 F (36.7 C) 97.7 F (36.5 C) 98 F (36.7 C) 97.6 F (36.4 C)  TempSrc: Oral Oral Oral Oral  SpO2: 99% 100% 100% 99%  Weight:      Height:        General exam: Early male, moderately built, frail and chronically ill looking lying comfortably supine in bed. Respiratory system: Clear to auscultation. Respiratory effort normal. Cardiovascular system: S1 & S2 heard, RRR. No JVD, murmurs, rubs, gallops or clicks. No pedal edema.  Telemetry personally reviewed: Sinus rhythm with frequent PACs. Gastrointestinal system: Abdomen is nondistended, soft and nontender. No organomegaly or masses felt. Normal bowel sounds heard. Central nervous system: Alert and oriented as noted above. No focal neurological deficits.  No involuntary movements noted. Extremities: Symmetric 5 x 5 power. Skin: No rashes, lesions or ulcers Psychiatry: Judgement and insight  impaired. Mood & affect appropriate. GU: Foley catheter in place with thigh bag.    Data Reviewed:   I have personally reviewed following labs and imaging studies   CBC: Recent Labs  Lab 04/21/21 1050 04/22/21 0308  WBC 8.2 7.6  NEUTROABS 6.8  --   HGB 9.8* 9.7*  HCT 29.9* 29.5*  MCV 92.3 91.3  PLT 200 063    Basic Metabolic Panel: Recent Labs  Lab 04/21/21 1050 04/22/21 0308  NA 143 141  K 3.8 3.5  CL 110 109  CO2 25 22  GLUCOSE 90 81  BUN 38* 30*  CREATININE 2.07* 1.95*  CALCIUM 9.8 9.4    Liver Function Tests: Recent Labs  Lab 04/21/21 1050  AST 16  ALT 12  ALKPHOS 73  BILITOT 0.6  PROT 6.5  ALBUMIN 3.5    CBG: Recent Labs  Lab 04/21/21 1037  GLUCAP 81    Microbiology Studies:   Recent Results (from the past 240 hour(s))  Urine culture     Status: Abnormal (Preliminary result)   Collection Time: 04/21/21 10:40 AM   Specimen: Urine, Clean  Catch  Result Value Ref Range Status   Specimen Description   Final    URINE, CLEAN CATCH Performed at [facility], [place] [address], [place], [place] [address]    Special Requests   Final    NONE Performed at [facility], [place] [address] [place], [place] [address]    Culture (A)  Final    >=100,000 COLONIES/mL GRAM NEGATIVE RODS IDENTIFICATION AND SUSCEPTIBILITIES TO FOLLOW Performed at [facility], [place] [address], [place], [place] [address]    Report Status PENDING  Incomplete  Culture, blood (routine x 2)     Status: None (Preliminary result)   Collection Time: 04/21/21 11:52 AM   Specimen: BLOOD  Result Value Ref Range Status   Specimen Description   Final    BLOOD RIGHT ANTECUBITAL Performed at [place] [address], [place], [place] [address]    Special Requests   Final    BOTTLES DRAWN AEROBIC AND ANAEROBIC Blood Culture adequate volume Performed at [place] [address], [place], [place]  [address]    Culture   Final    NO GROWTH < 24 HOURS Performed at [place] [address], [place], [place] [address]    Report Status PENDING  Incomplete  Culture, blood (routine x 2)     Status: None (Preliminary result)   Collection Time: 04/21/21 11:52 AM   Specimen: BLOOD  Result Value Ref Range Status   Specimen Description   Final    BLOOD LEFT ANTECUBITAL Performed at [place] [address] [place], [place] [address]    Special Requests   Final    BOTTLES DRAWN AEROBIC AND ANAEROBIC Blood Culture adequate volume Performed at [place] [address], [place], [place] [address]    Culture   Final    NO GROWTH < 24 HOURS Performed at [place] [address], [place], [place] [address]    Report Status PENDING  Incomplete  Resp Panel by RT-PCR (Flu A&B, Covid) Nasopharyngeal Swab     Status: None   Collection Time: 04/21/21 11:58 AM   Specimen: Nasopharyngeal Swab; Nasopharyngeal(NP) swabs in vial transport medium  Result Value Ref Range Status   SARS Coronavirus 2 by RT PCR NEGATIVE NEGATIVE Final    Comment: (NOTE) SARS-CoV-2 target nucleic acids are NOT DETECTED.  The SARS-CoV-2 RNA is generally detectable in upper respiratory specimens during the acute phase of infection. The lowest concentration of SARS-CoV-2 viral copies this assay can detect is 138 copies/mL. A negative result does not preclude SARS-Cov-2 infection and should not be used as the sole basis for treatment or other patient management decisions. A negative result may occur with  improper specimen collection/handling, submission of specimen other than nasopharyngeal swab, presence of viral mutation(s) within the areas targeted by this assay, and inadequate number of viral copies(<138 copies/mL). A negative result must be combined with clinical observations, patient history, and epidemiological information. The expected result is  Negative.  Fact Sheet for Patients:  EntrepreneurPulse.com.au  Fact Sheet for Healthcare Providers:  IncredibleEmployment.be  This test is no t yet approved or cleared by the [place] FDA and  has been authorized for detection and/or diagnosis of SARS-CoV-2 by FDA under an Emergency Use Authorization (EUA). This EUA will remain  in effect (meaning this test can be used) for the duration of the COVID-19 declaration under Section 564(b)(1) of the Act,  21 U.S.C.section 360bbb-3(b)(1), unless the authorization is terminated  or revoked sooner.       Influenza A by PCR NEGATIVE NEGATIVE Final   Influenza B by PCR NEGATIVE NEGATIVE Final    Comment: (NOTE) The Xpert Xpress SARS-CoV-2/FLU/RSV plus assay is intended as an aid in the diagnosis of influenza from Nasopharyngeal swab specimens and should not be used as a sole basis for treatment. Nasal washings and aspirates are unacceptable for Xpert Xpress SARS-CoV-2/FLU/RSV testing.  Fact Sheet for Patients: EntrepreneurPulse.com.au  Fact Sheet for Healthcare Providers: IncredibleEmployment.be  This test is not yet approved or cleared by the [place] FDA and has been authorized for detection and/or diagnosis of SARS-CoV-2 by FDA under an Emergency Use Authorization (EUA). This EUA will remain in effect (meaning this test can be used) for the duration of the COVID-19 declaration under Section 564(b)(1) of the Act, 21 U.S.C. section 360bbb-3(b)(1), unless the authorization is terminated or revoked.  Performed at [facility], [place] [address], [place], [place] [address]      Radiology Studies:  CT Head Wo Contrast  Result Date: 04/21/2021 CLINICAL DATA:  87 year old male found down. EXAM: CT HEAD WITHOUT CONTRAST TECHNIQUE: Contiguous axial images were obtained from the base of the skull through the vertex without intravenous  contrast. COMPARISON:  Head CT 11/08/2020. FINDINGS: Brain: Stable cerebral volume loss. Chronic encephalomalacia in the left inferior temporal lobe is stable. No midline shift, ventriculomegaly, mass effect, evidence of mass lesion, intracranial hemorrhage or evidence of cortically based acute infarction. Stable gray-white matter differentiation throughout the brain. Vascular: Mild Calcified atherosclerosis at the skull base. Intracranial artery dolichoectasia. No suspicious intracranial vascular hyperdensity. Skull: Stable and intact. Sinuses/Orbits: Visualized paranasal sinuses and mastoids are stable and well aerated. Other: Several chronic surgical clips along the right posterior scalp convexity are stable. No acute orbit or scalp soft tissue injury identified. IMPRESSION: 1. No acute traumatic injury identified. 2. Stable non contrast CT appearance of the brain with generalized volume loss and chronic left temporal lobe encephalomalacia. Electronically Signed   By: Genevie Ann M.D.   On: 04/21/2021 11:30   DG Pelvis Portable  Result Date: 04/21/2021 CLINICAL DATA:  87 year old male found down. EXAM: PORTABLE PELVIS 1-2 VIEWS COMPARISON:  CT Abdomen and Pelvis 08/09/2018. FINDINGS: Portable AP supine view at 1054 hours. Femoral heads are normally located. Pelvis appears stable and intact. Bilateral pelvic sidewall surgical clips are stable. SI joints appear symmetric. Grossly intact proximal femurs. No acute osseous abnormality identified. Negative visible lower abdominal and pelvic visceral contours. IMPRESSION: No acute fracture or dislocation identified about the pelvis. Electronically Signed   By: Genevie Ann M.D.   On: 04/21/2021 11:28   NM Pulmonary Perf and Vent  Result Date: 04/22/2021 CLINICAL DATA:  87 year old male with positive D-dimer. EXAM: NUCLEAR MEDICINE PERFUSION LUNG SCAN TECHNIQUE: Perfusion images were obtained in multiple projections after intravenous injection of radiopharmaceutical.  Ventilation scans intentionally deferred if perfusion scan and chest x-ray adequate for interpretation during COVID 19 epidemic. RADIOPHARMACEUTICALS:  4.2 mCi Tc-36m MAA IV COMPARISON:  04/21/2021 chest radiograph FINDINGS: No wedge-shaped perfusion defects are noted. IMPRESSION: Pulmonary embolism absent. Electronically Signed   By: Margarette Canada M.D.   On: 04/22/2021 14:20   DG Chest Portable 1 View  Result Date: 04/21/2021 CLINICAL DATA:  Fall. EXAM: PORTABLE CHEST 1 VIEW COMPARISON:  11/08/2020. FINDINGS: Cardiomegaly. No pulmonary venous congestion. Low lung volumes. Lungs are clear. No pleural effusion or pneumothorax. Right skin fold noted. Degenerative changes  scoliosis thoracic spine. IMPRESSION: 1.  Cardiomegaly.  No pulmonary venous congestion. 2.  Low lung volumes.  No acute pulmonary disease. Electronically Signed   By: Marcello Moores  Register   On: 04/21/2021 11:26   EEG adult  Result Date: 04/21/2021 Lora Havens, MD     04/21/2021  3:06 PM Patient Name: [name] MRN: [id number] Epilepsy Attending: Lora Havens Referring Physician/Provider: Clance Boll, NP Date: 04/21/2021 Duration: 24.45 mins Patient history: 87yo M with seizure like activity. EEG to evaluate for seizure. Level of alertness: Awake, asleep AEDs during EEG study: LEV Technical aspects: This EEG study was done with scalp electrodes positioned according to the 10-20 International system of electrode placement. Electrical activity was acquired at a sampling rate of 500Hz  and reviewed with a high frequency filter of 70Hz  and a low frequency filter of 1Hz . EEG data were recorded continuously and digitally stored. Description: No clear posterior dominant rhythm was seen. Sleep was characterized by vertex waves, sleep spindles (12 to 14 Hz), maximal frontocentral region.  EEG showed continuous 5 to 7 Hz theta slowing. Two episodes were noted at  1445 and 1458 during which patient was noted to have side-to-side head  movement. Concomitant EEG before, during and after the event did not show any EEG changes suggest seizure. Hyperventilation and photic stimulation were not performed.   ABNORMALITY - Continuous slow, generalized IMPRESSION: This study is suggestive of mild to moderate diffuse encephalopathy, nonspecific etiology.  Two episodes were noted as described above without concomitant EEG change and were not epileptic.  No seizures or epileptiform discharges were seen throughout the recording. Priyanka Barbra Sarks     Scheduled Meds:    allopurinol  300 mg Oral QPM   aspirin EC  81 mg Oral Daily   carvedilol  6.25 mg Oral BID WC   [START ON 04/23/2021] Chlorhexidine Gluconate Cloth  6 each Topical Q0600   cyanocobalamin  1,000 mcg Intramuscular Daily   heparin  5,000 Units Subcutaneous Q8H   sodium chloride flush  3 mL Intravenous Q12H    Continuous Infusions:    sodium chloride 75 mL/hr at 04/21/21 1550   levETIRAcetam 500 mg (04/22/21 1259)   piperacillin-tazobactam (ZOSYN)  IV Stopped (04/22/21 1545)     LOS: 1 day     Vernell Leep, MD, FACP, [facility]. [facility]    To contact the attending provider between 7A-7P or the covering provider during after hours 7P-7A, please log into the web site [email or web address] and access using universal [place] password for that web site. If you do not have the password, please call the hospital operator.  04/22/2021, 4:26 PM

## 2021-04-22 NOTE — Progress Notes (Signed)
Pt managed well overnight, no changes to neuro exams. However, his indwelling catheter is leaking, making it difficult to obtain accurate I/Os.

## 2021-04-23 ENCOUNTER — Inpatient Hospital Stay (HOSPITAL_COMMUNITY): Payer: Medicare Other

## 2021-04-23 DIAGNOSIS — R569 Unspecified convulsions: Secondary | ICD-10-CM

## 2021-04-23 DIAGNOSIS — N3000 Acute cystitis without hematuria: Secondary | ICD-10-CM

## 2021-04-23 DIAGNOSIS — I639 Cerebral infarction, unspecified: Secondary | ICD-10-CM

## 2021-04-23 LAB — CBC
HCT: 30.4 % — ABNORMAL LOW (ref 39.0–52.0)
Hemoglobin: 9.8 g/dL — ABNORMAL LOW (ref 13.0–17.0)
MCH: 29.6 pg (ref 26.0–34.0)
MCHC: 32.2 g/dL (ref 30.0–36.0)
MCV: 91.8 fL (ref 80.0–100.0)
Platelets: 205 10*3/uL (ref 150–400)
RBC: 3.31 MIL/uL — ABNORMAL LOW (ref 4.22–5.81)
RDW: 14.6 % (ref 11.5–15.5)
WBC: 7.6 10*3/uL (ref 4.0–10.5)
nRBC: 0 % (ref 0.0–0.2)

## 2021-04-23 LAB — BASIC METABOLIC PANEL
Anion gap: 11 (ref 5–15)
BUN: 27 mg/dL — ABNORMAL HIGH (ref 8–23)
CO2: 23 mmol/L (ref 22–32)
Calcium: 9.6 mg/dL (ref 8.9–10.3)
Chloride: 105 mmol/L (ref 98–111)
Creatinine, Ser: 1.97 mg/dL — ABNORMAL HIGH (ref 0.61–1.24)
GFR, Estimated: 32 mL/min — ABNORMAL LOW (ref >60)
Glucose, Bld: 85 mg/dL (ref 70–99)
Potassium: 3.4 mmol/L — ABNORMAL LOW (ref 3.5–5.1)
Sodium: 139 mmol/L (ref 135–145)

## 2021-04-23 LAB — LIPID PANEL
Cholesterol: 104 mg/dL (ref 0–200)
HDL: 41 mg/dL (ref >40)
LDL Cholesterol: 51 mg/dL (ref 0–99)
Total CHOL/HDL Ratio: 2.5 RATIO
Triglycerides: 60 mg/dL (ref <150)
VLDL: 12 mg/dL (ref 0–40)

## 2021-04-23 LAB — HEMOGLOBIN A1C
Hgb A1c MFr Bld: 5.7 % — ABNORMAL HIGH (ref 4.8–5.6)
Mean Plasma Glucose: 116.89 mg/dL

## 2021-04-23 MED ORDER — POTASSIUM CHLORIDE CRYS ER 20 MEQ PO TBCR
40.0000 meq | EXTENDED_RELEASE_TABLET | Freq: Once | ORAL | Status: AC
  Administered 2021-04-23: 40 meq via ORAL
  Filled 2021-04-23: qty 2

## 2021-04-23 MED ORDER — SODIUM CHLORIDE 0.9 % IV SOLN: INTRAVENOUS | Status: DC

## 2021-04-23 NOTE — TOC Initial Note (Signed)
Transition of Care [facility]) - Initial/Assessment Note    Patient Details  Name: [name] MRN: [id number] Date of Birth: 1931-01-24  Transition of Care [facility] CM/SW Contact:    Tresa Endo Phone Number: 04/23/2021, 1:29 PM  Clinical Narrative:                 80: CSW spoke with pt wife about going to a SNF and explained thee process at DC. Pt wife expressed that she would prefer if pt goes home with [facility] bc she is there throughout the day and could help him. Pt wife did request a shower seat and a 2 wheel rollaider.         Patient Goals and CMS Choice        Expected Discharge Plan and Services                                                Prior Living Arrangements/Services                       Activities of Daily Living      Permission Sought/Granted                  Emotional Assessment              Admission diagnosis:  Seizure ([place]) [R56.9] Chronic anemia [D64.9] Acute cystitis without hematuria [E33.29] Acute metabolic encephalopathy [J18.84] Stage 3b chronic kidney disease ([place]) [N18.32] Patient Active Problem List   Diagnosis Date Noted   Acute metabolic encephalopathy 16/60/6301   Fall 04/21/2021   Anemia of chronic disease 04/21/2021   AKI (acute kidney injury) ([place]) 60/07/9322   Complicated UTI (urinary tract infection) 04/21/2021    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 01/06/14 01/06/2014   Colon cancer ([place]) 12/15/2013   Acute lower GI bleeding 11/14/2013   History of pulmonary embolism 11/14/2013   LOCALIZED SUPERFICIAL SWELLING MASS OR LUMP 09/30/2010   DIVERTICULAR DISEASE 06/21/2009   ACNE ROSACEA 05/12/2009   ARTHRALGIA 05/12/2009   Gout, unspecified 05/11/2008   Essential hypertension 05/11/2008   KIDNEY DISEASE, CHRONIC NOS 05/11/2008   COUGH 04/17/2007   ESOPHAGEAL STRICTURE 01/23/2007   ADENOCARCINOMA, PROSTATE, HX OF 01/23/2007   PCP:  Haywood Pao, MD Pharmacy:    [facility] [place], [address] AT [place] [address] Phone: [phone] Fax: [phone]     Social Determinants of Health (SDOH) Interventions    Readmission Risk Interventions No flowsheet data found.

## 2021-04-23 NOTE — Progress Notes (Signed)
STROKE TEAM PROGRESS NOTE   INTERVAL HISTORY His CNA is at the bedside. He is calm and cooperative, but confused. No further seizure-like activity noted. Stable on Keppra. MRI showed scattered strokes throughout multiple vascular territories. Stroke team assumed care today from gen neuro team for stroke work up.   Vitals:   04/22/21 2353 04/23/21 0404 04/23/21 0732 04/23/21 1125  BP: 134/81 (!) 137/93 111/77 137/88  Pulse: 85 85 74 94  Resp: 18 18 18 20   Temp: 98.1 F (36.7 C) 98.1 F (36.7 C) 98.1 F (36.7 C) 98.5 F (36.9 C)  TempSrc: Oral Oral Oral Oral  SpO2: 97% 99% 100% 95%  Weight:      Height:       CBC:  Recent Labs  Lab 04/21/21 1050 04/22/21 0308 04/23/21 0356  WBC 8.2 7.6 7.6  NEUTROABS 6.8  --   --   HGB 9.8* 9.7* 9.8*  HCT 29.9* 29.5* 30.4*  MCV 92.3 91.3 91.8  PLT 200 192 277   Basic Metabolic Panel:  Recent Labs  Lab 04/22/21 0308 04/23/21 0356  NA 141 139  K 3.5 3.4*  CL 109 105  CO2 22 23  GLUCOSE 81 85  BUN 30* 27*  CREATININE 1.95* 1.97*  CALCIUM 9.4 9.6   Lipid Panel:  Recent Labs  Lab 04/23/21 0356  CHOL 104  TRIG 60  HDL 41  CHOLHDL 2.5  VLDL 12  LDLCALC 51   HgbA1c:  Recent Labs  Lab 04/23/21 0356  HGBA1C 5.7*   Urine Drug Screen: No results for input(s): LABOPIA, COCAINSCRNUR, LABBENZ, AMPHETMU, THCU, LABBARB in the last 168 hours.  Alcohol Level No results for input(s): ETH in the last 168 hours. EEG impression from 04/21/2021: "This study is suggestive of mild to moderate diffuse encephalopathy, nonspecific etiology.  Two episodes were noted as described above without concomitant EEG change and were not epileptic.  No seizures or epileptiform discharges were seen throughout the recording." IMAGING past 24 hours CT HEAD WO CONTRAST  Result Date: 04/22/2021 CLINICAL DATA:  Subarachnoid hemorrhage EXAM: CT HEAD WITHOUT CONTRAST TECHNIQUE: Contiguous axial images were obtained from the base of the skull through the vertex  without intravenous contrast. COMPARISON:  04/21/2021 FINDINGS: Brain: There is a small amount of subarachnoid blood, as demonstrated on MRI. No mass effect. There is generalized atrophy and findings of chronic small vessel disease. Old anterior left temporal lobe infarct. Vascular: No abnormal hyperdensity of the major intracranial arteries or dural venous sinuses. No intracranial atherosclerosis. Skull: The visualized skull base, calvarium and extracranial soft tissues are normal. Sinuses/Orbits: No fluid levels or advanced mucosal thickening of the visualized paranasal sinuses. No mastoid or middle ear effusion. The orbits are normal. IMPRESSION: 1. Small amount of subarachnoid blood, as demonstrated on MRI. No mass effect. 2. Old anterior left temporal lobe infarct. Electronically Signed   By: Ulyses Jarred M.D.   On: 04/22/2021 21:37   MR ANGIO HEAD WO CONTRAST  Result Date: 04/22/2021 CLINICAL DATA:  Subarachnoid hemorrhage EXAM: MRA HEAD WITHOUT CONTRAST TECHNIQUE: Angiographic images of the Circle of Willis were acquired using MRA technique without intravenous contrast. COMPARISON:  No pertinent prior exam. FINDINGS: POSTERIOR CIRCULATION: --Vertebral arteries: Normal --Inferior cerebellar arteries: Normal. --Basilar artery: Normal. --Superior cerebellar arteries: Normal. --Posterior cerebral arteries: Normal. ANTERIOR CIRCULATION: --Intracranial internal carotid arteries: Normal. --Anterior cerebral arteries (ACA): Normal. --Middle cerebral arteries (MCA): Normal. ANATOMIC VARIANTS: None Other: None. IMPRESSION: No aneurysm or intracranial arterial occlusion Electronically Signed   By: Lennette Bihari  Collins [place] M.D.   On: 04/22/2021 22:04   MR BRAIN WO CONTRAST  Addendum Date: 04/22/2021   ADDENDUM REPORT: 04/22/2021 20:16 ADDENDUM: Critical Value/emergent results were called by telephone at the time of interpretation on 04/22/2021 at 8:15 pm to provider Sal Lorrin Goodell, who verbally acknowledged these results.  Electronically Signed   By: Ulyses Jarred M.D.   On: 04/22/2021 20:16   Result Date: 04/22/2021 CLINICAL DATA:  Encephalopathy. EXAM: MRI HEAD WITHOUT CONTRAST MRI CERVICAL SPINE WITHOUT CONTRAST TECHNIQUE: Multiplanar, multiecho pulse sequences of the brain and surrounding structures, and cervical spine, to include the craniocervical junction and cervicothoracic junction, were obtained without intravenous contrast. COMPARISON:  None. FINDINGS: MRI HEAD FINDINGS Brain: Punctate foci of restriction right cerebellar hemisphere, right temporal lobe and medial left frontal lobe. There is abnormal FLAIR hyperintensity subarachnoid space overlying both lobes and the right cingulate gyrus. There is multifocal hyperintense T2-weighted signal within the white matter. Diffuse, severe atrophy. There is an old left temporal lobe infarct. The midline structures are normal. There is chronic hemosiderin deposition over both anterior hemispheres, right greater than left. Vascular: Major flow voids are preserved. Skull and upper cervical spine: Normal calvarium and skull base. Visualized upper cervical spine and soft tissues are normal. Sinuses/Orbits:No paranasal sinus fluid levels or advanced mucosal thickening. No mastoid or middle ear effusion. Normal orbits. MRI CERVICAL SPINE FINDINGS Truncated and motion degraded examination. Alignment: Physiologic. Vertebrae: No fracture, evidence of discitis, or bone lesion. Cord: Normal signal and morphology. Posterior Fossa, vertebral arteries, paraspinal tissues: Poor visualization of vertebral arteries due to motion. Otherwise unremarkable. Disc levels: Assessment of the disc levels is severely degraded by motion. Within that limitation, there is no high-grade spinal canal stenosis. IMPRESSION: 1. Small volume subarachnoid hemorrhage. 2. Punctate foci of acute ischemia within the right cerebellar hemisphere, right temporal lobe and medial left frontal lobe. 3. Severe atrophy with  findings of chronic small vessel ischemia. 4. No high-grade spinal canal stenosis. Electronically Signed: By: Ulyses Jarred M.D. On: 04/22/2021 20:08   MR CERVICAL SPINE WO CONTRAST  Addendum Date: 04/22/2021   ADDENDUM REPORT: 04/22/2021 20:16 ADDENDUM: Critical Value/emergent results were called by telephone at the time of interpretation on 04/22/2021 at 8:15 pm to provider Sal Lorrin Goodell, who verbally acknowledged these results. Electronically Signed   By: Ulyses Jarred M.D.   On: 04/22/2021 20:16   Result Date: 04/22/2021 CLINICAL DATA:  Encephalopathy. EXAM: MRI HEAD WITHOUT CONTRAST MRI CERVICAL SPINE WITHOUT CONTRAST TECHNIQUE: Multiplanar, multiecho pulse sequences of the brain and surrounding structures, and cervical spine, to include the craniocervical junction and cervicothoracic junction, were obtained without intravenous contrast. COMPARISON:  None. FINDINGS: MRI HEAD FINDINGS Brain: Punctate foci of restriction right cerebellar hemisphere, right temporal lobe and medial left frontal lobe. There is abnormal FLAIR hyperintensity subarachnoid space overlying both lobes and the right cingulate gyrus. There is multifocal hyperintense T2-weighted signal within the white matter. Diffuse, severe atrophy. There is an old left temporal lobe infarct. The midline structures are normal. There is chronic hemosiderin deposition over both anterior hemispheres, right greater than left. Vascular: Major flow voids are preserved. Skull and upper cervical spine: Normal calvarium and skull base. Visualized upper cervical spine and soft tissues are normal. Sinuses/Orbits:No paranasal sinus fluid levels or advanced mucosal thickening. No mastoid or middle ear effusion. Normal orbits. MRI CERVICAL SPINE FINDINGS Truncated and motion degraded examination. Alignment: Physiologic. Vertebrae: No fracture, evidence of discitis, or bone lesion. Cord: Normal signal and morphology. Posterior Fossa, vertebral arteries, paraspinal  tissues:  Poor visualization of vertebral arteries due to motion. Otherwise unremarkable. Disc levels: Assessment of the disc levels is severely degraded by motion. Within that limitation, there is no high-grade spinal canal stenosis. IMPRESSION: 1. Small volume subarachnoid hemorrhage. 2. Punctate foci of acute ischemia within the right cerebellar hemisphere, right temporal lobe and medial left frontal lobe. 3. Severe atrophy with findings of chronic small vessel ischemia. 4. No high-grade spinal canal stenosis. Electronically Signed: By: Ulyses Jarred M.D. On: 04/22/2021 20:08   VAS US CAROTID  Result Date: 04/23/2021 Carotid Arterial Duplex Study Patient Name:  HAWTHORNE DAY  Date of Exam:   04/23/2021 Medical Rec #: [id number]         Accession #:    [id number] Date of Birth: 10-09-1931        Patient Gender: M Patient Age:   89Y Exam Location:  [facility] Procedure:      VAS US CAROTID Referring Phys: [id number] [place] --------------------------------------------------------------------------------  Indications:       CVA. Risk Factors:      Hypertension. Limitations        Today's exam was limited due to the patient's respiratory                    variation and patient somnolence, patient movement, patient                    positioning. Comparison Study:  No prior studies. Performing Technologist: Oliver Hum RVT  Examination Guidelines: A complete evaluation includes B-mode imaging, spectral Doppler, color Doppler, and power Doppler as needed of all accessible portions of each vessel. Bilateral testing is considered an integral part of a complete examination. Limited examinations for reoccurring indications may be performed as noted.  Right Carotid Findings: +----------+--------+--------+--------+-----------------------+--------+           PSV cm/sEDV cm/sStenosisPlaque Description     Comments +----------+--------+--------+--------+-----------------------+--------+ CCA Prox   70      12              smooth and heterogenoustortuous +----------+--------+--------+--------+-----------------------+--------+ CCA Distal43      11              smooth and heterogenous         +----------+--------+--------+--------+-----------------------+--------+ ICA Prox  40      16              smooth and heterogenous         +----------+--------+--------+--------+-----------------------+--------+ ICA Distal43      12                                     tortuous +----------+--------+--------+--------+-----------------------+--------+ ECA       63      12                                              +----------+--------+--------+--------+-----------------------+--------+ +----------+--------+-------+--------+-------------------+           PSV cm/sEDV cmsDescribeArm Pressure (mmHG) +----------+--------+-------+--------+-------------------+ [id number]                                         +----------+--------+-------+--------+-------------------+ +---------+--------+--+--------+-+---------+ VertebralPSV cm/s23EDV cm/s5Antegrade +---------+--------+--+--------+-+---------+  Left Carotid Findings: +----------+--------+--------+--------+-----------------------+--------+  PSV cm/sEDV cm/sStenosisPlaque Description     Comments +----------+--------+--------+--------+-----------------------+--------+ CCA Prox  55      12              smooth and heterogenous         +----------+--------+--------+--------+-----------------------+--------+ CCA Distal55      11              smooth and heterogenous         +----------+--------+--------+--------+-----------------------+--------+ ICA Prox  44      11              smooth and heterogenous         +----------+--------+--------+--------+-----------------------+--------+ ICA Distal49      8                                      tortuous  +----------+--------+--------+--------+-----------------------+--------+ ECA       73      10                                              +----------+--------+--------+--------+-----------------------+--------+ +----------+--------+--------+--------+-------------------+           PSV cm/sEDV cm/sDescribeArm Pressure (mmHG) +----------+--------+--------+--------+-------------------+ Subclavian100                                         +----------+--------+--------+--------+-------------------+ +---------+--------+--+--------+--+---------+ VertebralPSV cm/s63EDV cm/s17Antegrade +---------+--------+--+--------+--+---------+   Summary: Right Carotid: Velocities in the right ICA are consistent with a 1-39% stenosis. Left Carotid: Velocities in the left ICA are consistent with a 1-39% stenosis. Vertebrals: Bilateral vertebral arteries demonstrate antegrade flow. *See table(s) above for measurements and observations.     Preliminary     PHYSICAL EXAM General: Appears frail, elderly. No acute distress Psych: Affect appropriate to situation Eyes: No scleral injection HENT: No OP obstrucion Head: Normocephalic.  Cardiovascular: Normal rate and regular rhythm.  Respiratory: Effort normal and breath sounds normal to anterior ascultation GI: Soft.  No distension. There is no tenderness.  Skin: WDI    Neurological Examination Mental Status: Alert, oriented, to name and gives his dob as the answer to everything else I ask. Speech is sparse, but seems fluent, slightly dysarthric. Follows simple commands, but unable to preform complex commands Cranial Nerves: PERRL, 10mm. EOMI. VFF. Face is symmetric, denies sensation change. Very HOH. Motor:Tone and bulk:normal tone throughout; no atrophy for age. Some gen weakness noted throughout, but LUE with drift. Grips feel equal. Sensory:  light touch intact throughout, bilaterally Deep Tendon Reflexes: 2+ and symmetric  throughout Plantars: Right: downgoing   Left: downgoing Cerebellar:unable; no gross ataxia Gait: defered  ASSESSMENT/PLAN Mr. REEDY BIERNAT is a 87 y.o. male with history of dementia presenting after being found down with seizure-like activity. He received Ativan and Keppra load, now on keppra 500mg  BID and no further seizure activity reported. MRI was done as part of seizure work up and scattered strokes found in multiple vascular territories and at least two small areas of hemorrhage, which raises concern for CAA using the [place] Criteria given his dementia at baseline.   Stroke: Multiple cardioembolic appearing strokes. Etiology undetermined at this time; work up underway. Code Stroke CT head No acute abnormality. Small vessel disease. Atrophy. Left  temporal encephalomalacia MRI  Small volume hemorrhage in left frontal, but upon review there may be other microhemmorhages, but difficult to tell given poor quality of MR which is degraded by movement. Punctate foci of acute ischemia within the right cerebellar hemisphere, right temporal lobe and medial left frontal lobe. MRA  neg Carotid Doppler  neg 2D Echo pending LDL 51 HgbA1c 5.7 VTE prophylaxis - scds; holding chemoppx d/t some hemorrhage noted on MRI    Diet   Diet Heart Room service appropriate? Yes; Fluid consistency: Thin   aspirin 81 mg daily prior to admission, now on  Holding d/t bleed .  Therapy recommendations:  SNF Disposition:  pending  Hypertension Home meds:  Coreg, hydrodiuril Stable Permissive hypertension (OK if < 220/120) but gradually normalize in 5-7 days Long-term BP goal normotensive  Hyperlipidemia Home meds:  Fish oil LDL 51, goal < 70 High intensity statin not indicated Continue statin at discharge  Diabetes type II - no dx Home meds:  none HgbA1c 5.7, goal < 7.0 CBGs Recent Labs    04/21/21 1037  GLUCAP 81    SSI  Other Stroke Risk Factors Advanced Age >/= 50  Hx stroke/TIA Family  hx stroke   Other Active Problems Seizures- continue on keppra 500mg  BID po Chronic foley catheter with freq UTIs AKI on DKD3 Both hypo and hyperactive delirium w/agitation- Continue Seroquel QHS and PRN Encephalopathy- multi-factorial Vascular Dementia Vit [facility] day # 2  Jalayla Chrismer Metzger-Cihelka, ARNP-C, ANVP-BC Pager: [phone]   To contact Stroke Continuity provider, please refer to [email or web address] After hours, contact General Neurology

## 2021-04-23 NOTE — Progress Notes (Signed)
Patient refusing all PO meds. When asked why patient states "I'm alright". RN explained the purpose or the medications and their importance, patient continues to refuse, MD aware.

## 2021-04-23 NOTE — Progress Notes (Signed)
Carotid artery duplex has been completed. Preliminary results can be found in CV Proc through chart review.   04/23/21 10:01 AM Carlos Levering RVT

## 2021-04-23 NOTE — Progress Notes (Signed)
   04/23/21 0900  Pre-Screen Questions- If "YES" to any of the following questions, STOP the screen, keep NPO, and place order for SLP eval and treat   Home diet required thickened liquids No  Trach tube present No  Radiation to Head/Neck No  Patient Readiness for Screen - If "YES" to any of the following questions, WAIT to screen, keep NPO  Is patient lethargic or unable to stay alert/awake? No  HOB restricted to <30 degrees No  NPO for planned procedure No  Brief Cognitive Screen- Aspiration Risk Assessment  Is patient oriented to name, place, or year? No  Able to open mouth, stick out tongue, or smile Yes  Oral Mechanism Exam  Able to seal lips Yes  Able to move tongue from side to side Yes  Face is symmetric Yes  Mandatory Oral Care Performed   Mandatory oral care performed Yes  3 oz Water Swallow Challenge  Does the patient stop drinking? No  Does the patient cough/choke? No  Screening Result Passed

## 2021-04-23 NOTE — Evaluation (Signed)
Occupational Therapy Evaluation Patient Details Name: [name] MRN: [id number] DOB: 22-Apr-1931 Today's Date: 04/23/2021    History of Present Illness 87 yo male [facility]/30 after pt found down at home by wife. 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 PMH includes Alzheimer's dementia, HTN, HLD, CKD III, bladder tumor s/p TURB, urinary retention with chronic Foley, gout, colon cancer, and PVD.   Clinical Impression   Patient admitted for the diagnosis above.  Limited eval this date, patient is having difficulty staying awake, and closing his eyes throughout.  Prior level of function was difficult to ascertain, OT will attempt to call his spouse to gather details, and amend this note as needed.  Barriers are listed below.  Currently he is needing up to Max A for basic mobility and lower body ADL.  Lower level goals set, and adjustments will be made as needed.  Based on 24 hour max A level, SNF is being recommended for post acute rehab.  OT will continue to follow in the acute setting to maximize function, and assist with discharge planning.      Follow Up Recommendations  SNF    Equipment Recommendations  3 in 1 bedside commode;Tub/shower seat    Recommendations for Other Services       Precautions / Restrictions Precautions Precautions: Fall Restrictions Weight Bearing Restrictions: No      Mobility Bed Mobility Overal bed mobility: Needs Assistance Bed Mobility: Supine to Sit;Sit to Supine     Supine to sit: Max assist Sit to supine: Max assist     Patient Response: Restless;Cooperative  Transfers                      Balance Overall balance assessment: Needs assistance Sitting-balance support: Single extremity supported;Feet supported Sitting balance-Leahy Scale: Fair                                     ADL either performed or assessed with clinical judgement   ADL Overall ADL's : Needs  assistance/impaired     Grooming: Wash/dry hands;Wash/dry face;Minimal assistance;Bed level           Upper Body Dressing : Minimal assistance;Bed level   Lower Body Dressing: Maximal assistance;Bed level                       Vision   Vision Assessment?: No apparent visual deficits Additional Comments: additional testing may be needed.  Patient with decreased level of alertness - closing eyes throughout     Perception     Praxis      Pertinent Vitals/Pain Pain Assessment: Faces Faces Pain Scale: No hurt Pain Intervention(s): Monitored during session     Hand Dominance Right   Extremity/Trunk Assessment Upper Extremity Assessment Upper Extremity Assessment: Generalized weakness   Lower Extremity Assessment Lower Extremity Assessment: Generalized weakness   Cervical / Trunk Assessment Cervical / Trunk Assessment: Normal   Communication Communication Communication: No difficulties   Cognition Arousal/Alertness: Lethargic Behavior During Therapy: Restless Overall Cognitive Status: No family/caregiver present to determine baseline cognitive functioning                   Orientation Level: Disoriented to;Place;Situation   Memory: Decreased short-term memory Following Commands: Follows one step commands inconsistently Safety/Judgement: Decreased awareness of safety;Decreased awareness of deficits   Problem Solving: Decreased  initiation;Difficulty sequencing;Requires verbal cues;Requires tactile cues     General Comments       Exercises     Shoulder Instructions      Home Living Family/patient expects to be discharged to:: Private residence Living Arrangements: Spouse/significant other Available Help at Discharge: Family Type of Home: House                                  Prior Functioning/Environment Level of Independence: Needs assistance  Gait / Transfers Assistance Needed: Per Chart: patient was able to walk in the  home without an AD ADL's / Homemaking Assistance Needed: Patient unable to discuss PLOF with respect to ADL/IADL.  The chart did suggest he managed his own meds.            OT Problem List: Decreased strength;Decreased activity tolerance;Impaired balance (sitting and/or standing);Decreased safety awareness;Decreased cognition      OT Treatment/Interventions: Self-care/ADL training;Therapeutic exercise;DME and/or AE instruction;Balance training;Patient/family education;Therapeutic activities    OT Goals(Current goals can be found in the care plan section) Acute Rehab OT Goals Patient Stated Goal: none stated OT Goal Formulation: Patient unable to participate in goal setting Time For Goal Achievement: 05/07/21 Potential to Achieve Goals: Fair ADL Goals Pt Will Perform Grooming: with supervision;sitting;standing Pt Will Perform Upper Body Bathing: with supervision;sitting;standing Pt Will Perform Upper Body Dressing: with supervision;sitting;standing Pt Will Transfer to Toilet: with min assist;ambulating Pt Will Perform Toileting - Clothing Manipulation and hygiene: with min guard assist;sit to/from stand  OT Frequency: Min 2X/week   Barriers to D/C:    none noted       Co-evaluation              AM-PAC OT "6 Clicks" Daily Activity     Outcome Measure Help from another person eating meals?: A Little Help from another person taking care of personal grooming?: A Little Help from another person toileting, which includes using toliet, bedpan, or urinal?: A Lot Help from another person bathing (including washing, rinsing, drying)?: A Lot Help from another person to put on and taking off regular upper body clothing?: A Little Help from another person to put on and taking off regular lower body clothing?: A Lot 6 Click Score: 15   End of Session    Activity Tolerance: Patient limited by lethargy Patient left: in bed;with call bell/phone within reach;with bed alarm set;with  nursing/sitter in room  OT Visit Diagnosis: Muscle weakness (generalized) (M62.81);History of falling (Z91.81);Other symptoms and signs involving cognitive function                Time: 1003-1019 OT Time Calculation (min): 16 min Charges:  OT General Charges $OT Visit: 1 Visit OT Evaluation $OT Eval Moderate Complexity: 1 Mod  04/23/2021  Rich, OTR/L  Acute Rehabilitation Services  Office:  [phone]   Metta Clines 04/23/2021, 11:10 AM

## 2021-04-23 NOTE — Progress Notes (Addendum)
PROGRESS NOTE   [name]  XBM:[id number]    DOB: 03-20-1931    DOA: 04/21/2021  PCP: Haywood Pao, MD   I have briefly reviewed patients previous medical records in [facility].  Chief Complaint  Patient presents with   Weakness   Altered Mental Status    Brief Narrative:  87 year old male with medical history of hypertension, hyperlipidemia, DVT/PE, CKD stage IIIb, Alzheimer's dementia, recurrent prostate cancer, bladder tumor s/p TURBT, urinary retention with chronic Foley, PAD, presented to the [facility] ED after an unwitnessed fall at home.  Wife found patient on the floor and could not get him up.  911 was called.  In the ED patient noted to have seizure-like activity/muscle spasm in left leg, received 1.5 mg of Ativan and loaded with 2 g of IV Keppra and transferred to [facility] ED for evaluation by neurology.  Admitted for seizure-like activity.  Further work-up revealed multiple cardioembolic appearing strokes and small subarachnoid hemorrhage.  Stroke team following.   Assessment & Plan:  Principal Problem:   Acute metabolic encephalopathy Active Problems:   Essential hypertension   ADENOCARCINOMA, PROSTATE, HX OF   History of pulmonary embolism   Fall   Anemia of chronic disease   AKI (acute kidney injury) ([place])   Complicated UTI (urinary tract infection)   Seizure-like activity: Initially felt to be polymyoclonus.  However further work-up has now revealed multiple cardioembolic appearing strokes and a small subarachnoid hemorrhage.  As per neurology, high right frontal stroke can possibly explain the left leg jerking movements which are likely more of weakness as well as irritation from the surrounding small SAH. CT head 6/30 without acute findings.  MRI brain 7/1: Small volume SAH.  Punctate foci of acute ischemia within the right cerebellar hemisphere, right temporal lobe and medial left frontal lobe.  MRI C-spine: No high-grade spinal canal stenosis.    Acute  ischemic strokes: MRI brain as noted above.  MRA head: No aneurysm or intracranial artery occlusion.  CUS: Bilateral 1 to 39% ICA stenosis.  Bilateral vertebral arteries with antegrade flow. Echo pending. A1C: 5.7.  LDL 51.  OT and SLP recommend SNF.  PT input pending.  Spouse prefers taking him home with home health services.  No antiplatelets due to [facility].  Stroke team following.  Small SAH: Follow-up CT head showed small amount of SAH without mass-effect.  MRA head without aneurysm.  Acute metabolic encephalopathy: Likely multifactorial related to multiple acute strokes, small SAH, med/Ativan in ED, complicating underlying dementia.  TSH normal.  Ongoing B12 supplementation.  Thiamine level pending.  Continuing Keppra-defer decision regarding continuing or discontinuing to neurology. Treating infectious/metabolic derangements as below.  Avoid sedative medications, delirium precautions.  EEG consistent with mild to moderate diffuse encephalopathy.  If continues to be sleepy, may need to reduce or stop Keppra if no indication.  B12 deficiency: B12 level: 131.  While hospitalized B12, 1000 mcg subcu daily x1 week, can switch to oral at time of discharge, then 1000 mcg orally daily.  Catheter associated Citrobacter UTI/complicated UTI/recurrent UTI: Changed Foley catheter 7/1.  Blood cultures x2: Negative to date.  Urine culture: >100 K colonies of Citrobacter-resistant to cefazolin, Bactrim and intermediate to nitrofurantoin.  Consider switching IV Zosyn to ceftriaxone.    Acute kidney injury complicating stage IIIb CKD: Baseline creatinine not clearly known but may be in the one-point 8-2 range.  Presented with creatinine of 2.07.  Improved to 1.95 >1.97.  Creatinine has plateaued, likely at baseline now.  Fall at home: Details not known.  Could be related to involuntary movement of his left lower extremity.  Lack of adequate history.  In the ED reportedly noted to have sinus pause and ventricular  bigeminy on EKG.  Telemetry shows sinus rhythm with frequent PACs.  D-dimer elevated.  VQ scan negative.  Continue telemetry-no significant issues noted.  Hypokalemia: Replace and follow.  Anemia of chronic disease: Stable  Alzheimer's dementia: No behavioral abnormalities currently.  Essential hypertension: Holding HCTZ due to AKI.  Resume dcarvedilol.  History of DVT/PE: Reportedly provoked after surgery.  VQ scan negative this admission.  History of prostate cancer: Follows with Dr. Gloriann Loan, urology.  Body mass index is 24.39 kg/m.   DVT prophylaxis: Place and maintain sequential compression device Start: 04/22/21 2018     Code Status: Full Code Family Communication: I discussed in detail with patient's spouse in detail, updated care and answered all questions.  Disposition:  Status is: Inpatient  Remains inpatient appropriate because:Inpatient level of care appropriate due to severity of illness  Dispo: The patient is from: Home              Anticipated d/c is to: SNF              Patient currently is not medically stable to d/c.   Difficult to place patient No        Consultants:   Neurology  Procedures:   Change of Foley catheter 7/1  Antimicrobials:    Anti-infectives (From admission, onward)    Start     Dose/Rate Route Frequency Ordered Stop   04/21/21 2300  piperacillin-tazobactam (ZOSYN) IVPB 3.375 g        3.375 g 12.5 mL/hr over 240 Minutes Intravenous Every 8 hours 04/21/21 1434     04/21/21 1500  piperacillin-tazobactam (ZOSYN) IVPB 3.375 g        3.375 g 100 mL/hr over 30 Minutes Intravenous  Once 04/21/21 1434 04/21/21 1641   04/21/21 1115  cefTRIAXone (ROCEPHIN) 1 g in sodium chloride 0.9 % 100 mL IVPB        1 g 200 mL/hr over 30 Minutes Intravenous  Once 04/21/21 1106 04/21/21 1242         Subjective:  Sleepy this morning.  Barely arousable, opens eyes and drifts back to sleep.  No verbal response and does not follow  instructions.  Objective:   Vitals:   04/22/21 2353 04/23/21 0404 04/23/21 0732 04/23/21 1125  BP: 134/81 (!) 137/93 111/77 137/88  Pulse: 85 85 74 94  Resp: 18 18 18 20   Temp: 98.1 F (36.7 C) 98.1 F (36.7 C) 98.1 F (36.7 C) 98.5 F (36.9 C)  TempSrc: Oral Oral Oral Oral  SpO2: 97% 99% 100% 95%  Weight:      Height:        General exam: Early male, moderately built, frail and chronically ill looking lying comfortably supine in bed. Respiratory system: Clear to auscultation.  No increased work of breathing. Cardiovascular system: S1 and S2 heard, RRR.  No JVD, murmurs or pedal edema.  Telemetry personally reviewed: Sinus rhythm with PACs. Gastrointestinal system: Abdomen is nondistended, soft and nontender. No organomegaly or masses felt. Normal bowel sounds heard. Central nervous system: Mental status as noted above.  No cranial nerve deficits noted.  No involuntary movements noted. Extremities: Symmetric 5 x 5 power. Skin: No rashes, lesions or ulcers Psychiatry: Judgement and insight impaired. Mood & affect cannot be assessed. GU: Foley catheter in place with  thigh bag.    Data Reviewed:   I have personally reviewed following labs and imaging studies   CBC: Recent Labs  Lab 04/21/21 1050 04/22/21 0308 04/23/21 0356  WBC 8.2 7.6 7.6  NEUTROABS 6.8  --   --   HGB 9.8* 9.7* 9.8*  HCT 29.9* 29.5* 30.4*  MCV 92.3 91.3 91.8  PLT 200 192 073    Basic Metabolic Panel: Recent Labs  Lab 04/21/21 1050 04/22/21 0308 04/23/21 0356  NA 143 141 139  K 3.8 3.5 3.4*  CL 110 109 105  CO2 25 22 23   GLUCOSE 90 81 85  BUN 38* 30* 27*  CREATININE 2.07* 1.95* 1.97*  CALCIUM 9.8 9.4 9.6    Liver Function Tests: Recent Labs  Lab 04/21/21 1050  AST 16  ALT 12  ALKPHOS 73  BILITOT 0.6  PROT 6.5  ALBUMIN 3.5    CBG: Recent Labs  Lab 04/21/21 1037  GLUCAP 81    Microbiology Studies:   Recent Results (from the past 240 hour(s))  Urine culture     Status:  Abnormal (Preliminary result)   Collection Time: 04/21/21 10:40 AM   Specimen: Urine, Clean Catch  Result Value Ref Range Status   Specimen Description   Final    URINE, CLEAN CATCH Performed at [facility], [place] [address], [place], [place] [address]    Special Requests   Final    NONE Performed at [facility], [place] [address] [place], [place] [address]    Culture (A)  Final    >=100,000 COLONIES/mL CITROBACTER FREUNDII CULTURE REINCUBATED FOR BETTER GROWTH Performed at [facility], [place] [address], [place], [place] [address]    Report Status PENDING  Incomplete   Organism ID, Bacteria CITROBACTER FREUNDII (A)  Final      Susceptibility   Citrobacter freundii - MIC*    CEFAZOLIN >=64 RESISTANT Resistant     CEFEPIME <=0.12 SENSITIVE Sensitive     CEFTRIAXONE 0.5 SENSITIVE Sensitive     CIPROFLOXACIN 1 SENSITIVE Sensitive     GENTAMICIN <=1 SENSITIVE Sensitive     IMIPENEM 0.5 SENSITIVE Sensitive     NITROFURANTOIN 64 INTERMEDIATE Intermediate     TRIMETH/SULFA >=320 RESISTANT Resistant     PIP/TAZO <=4 SENSITIVE Sensitive     * >=100,000 COLONIES/mL CITROBACTER FREUNDII  Culture, blood (routine x 2)     Status: None (Preliminary result)   Collection Time: 04/21/21 11:52 AM   Specimen: BLOOD  Result Value Ref Range Status   Specimen Description   Final    BLOOD RIGHT ANTECUBITAL Performed at [place] [address], [place], [place] [address]    Special Requests   Final    BOTTLES DRAWN AEROBIC AND ANAEROBIC Blood Culture adequate volume Performed at [place] [address], [place], [place] [address]    Culture   Final    NO GROWTH 2 DAYS Performed at [place] [address] [place], [place] [address]    Report Status PENDING  Incomplete  Culture, blood (routine x 2)     Status: None (Preliminary result)   Collection Time: 04/21/21 11:52 AM   Specimen: BLOOD   Result Value Ref Range Status   Specimen Description   Final    BLOOD LEFT ANTECUBITAL Performed at [place] [address] [place], [place] [address]    Special Requests   Final    BOTTLES DRAWN AEROBIC AND  ANAEROBIC Blood Culture adequate volume Performed at [place] [address], [place], [place] [address]    Culture   Final    NO GROWTH 2 DAYS Performed at [place] [address] [place], [place] [address]    Report Status PENDING  Incomplete  Resp Panel by RT-PCR (Flu A&B, Covid) Nasopharyngeal Swab     Status: None   Collection Time: 04/21/21 11:58 AM   Specimen: Nasopharyngeal Swab; Nasopharyngeal(NP) swabs in vial transport medium  Result Value Ref Range Status   SARS Coronavirus 2 by RT PCR NEGATIVE NEGATIVE Final    Comment: (NOTE) SARS-CoV-2 target nucleic acids are NOT DETECTED.  The SARS-CoV-2 RNA is generally detectable in upper respiratory specimens during the acute phase of infection. The lowest concentration of SARS-CoV-2 viral copies this assay can detect is 138 copies/mL. A negative result does not preclude SARS-Cov-2 infection and should not be used as the sole basis for treatment or other patient management decisions. A negative result may occur with  improper specimen collection/handling, submission of specimen other than nasopharyngeal swab, presence of viral mutation(s) within the areas targeted by this assay, and inadequate number of viral copies(<138 copies/mL). A negative result must be combined with clinical observations, patient history, and epidemiological information. The expected result is Negative.  Fact Sheet for Patients:  EntrepreneurPulse.com.au  Fact Sheet for Healthcare Providers:  IncredibleEmployment.be  This test is no t yet approved or cleared by the [place] FDA and  has been authorized for detection and/or diagnosis of  SARS-CoV-2 by FDA under an Emergency Use Authorization (EUA). This EUA will remain  in effect (meaning this test can be used) for the duration of the COVID-19 declaration under Section 564(b)(1) of the Act, 21 U.S.C.section 360bbb-3(b)(1), unless the authorization is terminated  or revoked sooner.       Influenza A by PCR NEGATIVE NEGATIVE Final   Influenza B by PCR NEGATIVE NEGATIVE Final    Comment: (NOTE) The Xpert Xpress SARS-CoV-2/FLU/RSV plus assay is intended as an aid in the diagnosis of influenza from Nasopharyngeal swab specimens and should not be used as a sole basis for treatment. Nasal washings and aspirates are unacceptable for Xpert Xpress SARS-CoV-2/FLU/RSV testing.  Fact Sheet for Patients: EntrepreneurPulse.com.au  Fact Sheet for Healthcare Providers: IncredibleEmployment.be  This test is not yet approved or cleared by the [place] FDA and has been authorized for detection and/or diagnosis of SARS-CoV-2 by FDA under an Emergency Use Authorization (EUA). This EUA will remain in effect (meaning this test can be used) for the duration of the COVID-19 declaration under Section 564(b)(1) of the Act, 21 U.S.C. section 360bbb-3(b)(1), unless the authorization is terminated or revoked.  Performed at [facility], [place] [address] [place], [place] [address]      Radiology Studies:  CT HEAD WO CONTRAST  Result Date: 04/22/2021 CLINICAL DATA:  Subarachnoid hemorrhage EXAM: CT HEAD WITHOUT CONTRAST TECHNIQUE: Contiguous axial images were obtained from the base of the skull through the vertex without intravenous contrast. COMPARISON:  04/21/2021 FINDINGS: Brain: There is a small amount of subarachnoid blood, as demonstrated on MRI. No mass effect. There is generalized atrophy and findings of chronic small vessel disease. Old anterior left temporal lobe infarct. Vascular: No abnormal hyperdensity of the major  intracranial arteries or dural venous sinuses. No intracranial atherosclerosis. Skull: The visualized skull base, calvarium and extracranial soft tissues are normal. Sinuses/Orbits: No fluid levels or advanced mucosal thickening of the visualized paranasal sinuses.  No mastoid or middle ear effusion. The orbits are normal. IMPRESSION: 1. Small amount of subarachnoid blood, as demonstrated on MRI. No mass effect. 2. Old anterior left temporal lobe infarct. Electronically Signed   By: Ulyses Jarred M.D.   On: 04/22/2021 21:37   MR ANGIO HEAD WO CONTRAST  Result Date: 04/22/2021 CLINICAL DATA:  Subarachnoid hemorrhage EXAM: MRA HEAD WITHOUT CONTRAST TECHNIQUE: Angiographic images of the Circle of Willis were acquired using MRA technique without intravenous contrast. COMPARISON:  No pertinent prior exam. FINDINGS: POSTERIOR CIRCULATION: --Vertebral arteries: Normal --Inferior cerebellar arteries: Normal. --Basilar artery: Normal. --Superior cerebellar arteries: Normal. --Posterior cerebral arteries: Normal. ANTERIOR CIRCULATION: --Intracranial internal carotid arteries: Normal. --Anterior cerebral arteries (ACA): Normal. --Middle cerebral arteries (MCA): Normal. ANATOMIC VARIANTS: None Other: None. IMPRESSION: No aneurysm or intracranial arterial occlusion Electronically Signed   By: Ulyses Jarred M.D.   On: 04/22/2021 22:04   MR BRAIN WO CONTRAST  Addendum Date: 04/22/2021   ADDENDUM REPORT: 04/22/2021 20:16 ADDENDUM: Critical Value/emergent results were called by telephone at the time of interpretation on 04/22/2021 at 8:15 pm to provider Sal Lorrin Goodell, who verbally acknowledged these results. Electronically Signed   By: Ulyses Jarred M.D.   On: 04/22/2021 20:16   Result Date: 04/22/2021 CLINICAL DATA:  Encephalopathy. EXAM: MRI HEAD WITHOUT CONTRAST MRI CERVICAL SPINE WITHOUT CONTRAST TECHNIQUE: Multiplanar, multiecho pulse sequences of the brain and surrounding structures, and cervical spine, to include the  craniocervical junction and cervicothoracic junction, were obtained without intravenous contrast. COMPARISON:  None. FINDINGS: MRI HEAD FINDINGS Brain: Punctate foci of restriction right cerebellar hemisphere, right temporal lobe and medial left frontal lobe. There is abnormal FLAIR hyperintensity subarachnoid space overlying both lobes and the right cingulate gyrus. There is multifocal hyperintense T2-weighted signal within the white matter. Diffuse, severe atrophy. There is an old left temporal lobe infarct. The midline structures are normal. There is chronic hemosiderin deposition over both anterior hemispheres, right greater than left. Vascular: Major flow voids are preserved. Skull and upper cervical spine: Normal calvarium and skull base. Visualized upper cervical spine and soft tissues are normal. Sinuses/Orbits:No paranasal sinus fluid levels or advanced mucosal thickening. No mastoid or middle ear effusion. Normal orbits. MRI CERVICAL SPINE FINDINGS Truncated and motion degraded examination. Alignment: Physiologic. Vertebrae: No fracture, evidence of discitis, or bone lesion. Cord: Normal signal and morphology. Posterior Fossa, vertebral arteries, paraspinal tissues: Poor visualization of vertebral arteries due to motion. Otherwise unremarkable. Disc levels: Assessment of the disc levels is severely degraded by motion. Within that limitation, there is no high-grade spinal canal stenosis. IMPRESSION: 1. Small volume subarachnoid hemorrhage. 2. Punctate foci of acute ischemia within the right cerebellar hemisphere, right temporal lobe and medial left frontal lobe. 3. Severe atrophy with findings of chronic small vessel ischemia. 4. No high-grade spinal canal stenosis. Electronically Signed: By: Ulyses Jarred M.D. On: 04/22/2021 20:08   MR CERVICAL SPINE WO CONTRAST  Addendum Date: 04/22/2021   ADDENDUM REPORT: 04/22/2021 20:16 ADDENDUM: Critical Value/emergent results were called by telephone at the time  of interpretation on 04/22/2021 at 8:15 pm to provider Sal Lorrin Goodell, who verbally acknowledged these results. Electronically Signed   By: Ulyses Jarred M.D.   On: 04/22/2021 20:16   Result Date: 04/22/2021 CLINICAL DATA:  Encephalopathy. EXAM: MRI HEAD WITHOUT CONTRAST MRI CERVICAL SPINE WITHOUT CONTRAST TECHNIQUE: Multiplanar, multiecho pulse sequences of the brain and surrounding structures, and cervical spine, to include the craniocervical junction and cervicothoracic junction, were obtained without intravenous contrast. COMPARISON:  None. FINDINGS: MRI HEAD  FINDINGS Brain: Punctate foci of restriction right cerebellar hemisphere, right temporal lobe and medial left frontal lobe. There is abnormal FLAIR hyperintensity subarachnoid space overlying both lobes and the right cingulate gyrus. There is multifocal hyperintense T2-weighted signal within the white matter. Diffuse, severe atrophy. There is an old left temporal lobe infarct. The midline structures are normal. There is chronic hemosiderin deposition over both anterior hemispheres, right greater than left. Vascular: Major flow voids are preserved. Skull and upper cervical spine: Normal calvarium and skull base. Visualized upper cervical spine and soft tissues are normal. Sinuses/Orbits:No paranasal sinus fluid levels or advanced mucosal thickening. No mastoid or middle ear effusion. Normal orbits. MRI CERVICAL SPINE FINDINGS Truncated and motion degraded examination. Alignment: Physiologic. Vertebrae: No fracture, evidence of discitis, or bone lesion. Cord: Normal signal and morphology. Posterior Fossa, vertebral arteries, paraspinal tissues: Poor visualization of vertebral arteries due to motion. Otherwise unremarkable. Disc levels: Assessment of the disc levels is severely degraded by motion. Within that limitation, there is no high-grade spinal canal stenosis. IMPRESSION: 1. Small volume subarachnoid hemorrhage. 2. Punctate foci of acute ischemia within  the right cerebellar hemisphere, right temporal lobe and medial left frontal lobe. 3. Severe atrophy with findings of chronic small vessel ischemia. 4. No high-grade spinal canal stenosis. Electronically Signed: By: Ulyses Jarred M.D. On: 04/22/2021 20:08   NM Pulmonary Perf and Vent  Result Date: 04/22/2021 CLINICAL DATA:  87 year old male with positive D-dimer. EXAM: NUCLEAR MEDICINE PERFUSION LUNG SCAN TECHNIQUE: Perfusion images were obtained in multiple projections after intravenous injection of radiopharmaceutical. Ventilation scans intentionally deferred if perfusion scan and chest x-ray adequate for interpretation during COVID 19 epidemic. RADIOPHARMACEUTICALS:  4.2 mCi Tc-64m MAA IV COMPARISON:  04/21/2021 chest radiograph FINDINGS: No wedge-shaped perfusion defects are noted. IMPRESSION: Pulmonary embolism absent. Electronically Signed   By: Margarette Canada M.D.   On: 04/22/2021 14:20   EEG adult  Result Date: 04/21/2021 Lora Havens, MD     04/21/2021  3:06 PM Patient Name: [name] MRN: [id number] Epilepsy Attending: Lora Havens Referring Physician/Provider: Clance Boll, NP Date: 04/21/2021 Duration: 24.45 mins Patient history: 87yo M with seizure like activity. EEG to evaluate for seizure. Level of alertness: Awake, asleep AEDs during EEG study: LEV Technical aspects: This EEG study was done with scalp electrodes positioned according to the 10-20 International system of electrode placement. Electrical activity was acquired at a sampling rate of 500Hz  and reviewed with a high frequency filter of 70Hz  and a low frequency filter of 1Hz . EEG data were recorded continuously and digitally stored. Description: No clear posterior dominant rhythm was seen. Sleep was characterized by vertex waves, sleep spindles (12 to 14 Hz), maximal frontocentral region.  EEG showed continuous 5 to 7 Hz theta slowing. Two episodes were noted at  1445 and 1458 during which patient was noted to have  side-to-side head movement. Concomitant EEG before, during and after the event did not show any EEG changes suggest seizure. Hyperventilation and photic stimulation were not performed.   ABNORMALITY - Continuous slow, generalized IMPRESSION: This study is suggestive of mild to moderate diffuse encephalopathy, nonspecific etiology.  Two episodes were noted as described above without concomitant EEG change and were not epileptic.  No seizures or epileptiform discharges were seen throughout the recording. Priyanka Barbra Sarks   VAS US CAROTID  Result Date: 04/23/2021 Carotid Arterial Duplex Study Patient Name:  JAECOB LOWDEN  Date of Exam:   04/23/2021 Medical Rec #: [id number]  Accession #:    [id number] Date of Birth: [birth year]        Patient Gender: M Patient Age:   89Y Exam Location:  [facility] Procedure:      VAS US CAROTID Referring Phys: [id number] [place] --------------------------------------------------------------------------------  Indications:       CVA. Risk Factors:      Hypertension. Limitations        Today's exam was limited due to the patient's respiratory                    variation and patient somnolence, patient movement, patient                    positioning. Comparison Study:  No prior studies. Performing Technologist: Oliver Hum RVT  Examination Guidelines: A complete evaluation includes B-mode imaging, spectral Doppler, color Doppler, and power Doppler as needed of all accessible portions of each vessel. Bilateral testing is considered an integral part of a complete examination. Limited examinations for reoccurring indications may be performed as noted.  Right Carotid Findings: +----------+--------+--------+--------+-----------------------+--------+           PSV cm/sEDV cm/sStenosisPlaque Description     Comments +----------+--------+--------+--------+-----------------------+--------+ CCA Prox  70      12              smooth and  heterogenoustortuous +----------+--------+--------+--------+-----------------------+--------+ CCA Distal43      11              smooth and heterogenous         +----------+--------+--------+--------+-----------------------+--------+ ICA Prox  40      16              smooth and heterogenous         +----------+--------+--------+--------+-----------------------+--------+ ICA Distal43      12                                     tortuous +----------+--------+--------+--------+-----------------------+--------+ ECA       63      12                                              +----------+--------+--------+--------+-----------------------+--------+ +----------+--------+-------+--------+-------------------+           PSV cm/sEDV cmsDescribeArm Pressure (mmHG) +----------+--------+-------+--------+-------------------+ [id number]                                         +----------+--------+-------+--------+-------------------+ +---------+--------+--+--------+-+---------+ VertebralPSV cm/s23EDV cm/s5Antegrade +---------+--------+--+--------+-+---------+  Left Carotid Findings: +----------+--------+--------+--------+-----------------------+--------+           PSV cm/sEDV cm/sStenosisPlaque Description     Comments +----------+--------+--------+--------+-----------------------+--------+ CCA Prox  55      12              smooth and heterogenous         +----------+--------+--------+--------+-----------------------+--------+ CCA Distal55      11              smooth and heterogenous         +----------+--------+--------+--------+-----------------------+--------+ ICA Prox  44      11              smooth and heterogenous         +----------+--------+--------+--------+-----------------------+--------+  ICA Distal49      8                                      tortuous +----------+--------+--------+--------+-----------------------+--------+ ECA        73      10                                              +----------+--------+--------+--------+-----------------------+--------+ +----------+--------+--------+--------+-------------------+           PSV cm/sEDV cm/sDescribeArm Pressure (mmHG) +----------+--------+--------+--------+-------------------+ Subclavian100                                         +----------+--------+--------+--------+-------------------+ +---------+--------+--+--------+--+---------+ VertebralPSV cm/s63EDV cm/s17Antegrade +---------+--------+--+--------+--+---------+   Summary: Right Carotid: Velocities in the right ICA are consistent with a 1-39% stenosis. Left Carotid: Velocities in the left ICA are consistent with a 1-39% stenosis. Vertebrals: Bilateral vertebral arteries demonstrate antegrade flow. *See table(s) above for measurements and observations.     Preliminary      Scheduled Meds:    allopurinol  300 mg Oral QPM   carvedilol  6.25 mg Oral BID WC   Chlorhexidine Gluconate Cloth  6 each Topical Q0600   cyanocobalamin  1,000 mcg Intramuscular Daily   sodium chloride flush  3 mL Intravenous Q12H    Continuous Infusions:    levETIRAcetam 500 mg (04/23/21 1241)   piperacillin-tazobactam (ZOSYN)  IV 3.375 g (04/23/21 0704)     LOS: 2 days     Vernell Leep, MD, [place], [facility]. [facility]    To contact the attending provider between 7A-7P or the covering provider during after hours 7P-7A, please log into the web site [email or web address] and access using universal [place] password for that web site. If you do not have the password, please call the hospital operator.  04/23/2021, 2:04 PM

## 2021-04-23 NOTE — Plan of Care (Signed)
High right frontal stroke can possibly explain the left leg jerky movements, which are likely more of weakness as well as the irritation from the surrounding small SAH. Stroke work up is appropriate and has been orderd by Dr. Lorrin Goodell. Stroke team to follow.   -- Amie Portland, MD Neurologist [facility] Pager: [phone]

## 2021-04-23 NOTE — NC FL2 (Signed)
[place] LEVEL OF CARE SCREENING TOOL     IDENTIFICATION  Patient Name: [name] Birthdate: 06-Mar-1931 Sex: male Admission Date (Current Location): 04/21/2021  [facility] and [place] Number:  Herbalist and Address:  [facility] [place], [place] [address], [place], [place] [address]      Provider Number: [id number]  Attending Physician Name and Address:  Modena Jansky, MD  Relative Name and Phone Number:  Everrett, Lacasse (Spouse)   [phone]    Current Level of Care: Hospital Recommended Level of Care: [place] Prior Approval Number:    Date Approved/Denied:   PASRR Number:    Discharge Plan: SNF    Current Diagnoses: Patient Active Problem List   Diagnosis Date Noted   Acute metabolic encephalopathy 00/86/7619   Fall 04/21/2021   Anemia of chronic disease 04/21/2021   AKI (acute kidney injury) ([place]) 50/93/2671   Complicated UTI (urinary tract infection) 04/21/2021    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 01/06/14 01/06/2014   Colon cancer ([place]) 12/15/2013   Acute lower GI bleeding 11/14/2013   History of pulmonary embolism 11/14/2013   LOCALIZED SUPERFICIAL SWELLING MASS OR LUMP 09/30/2010   DIVERTICULAR DISEASE 06/21/2009   ACNE ROSACEA 05/12/2009   ARTHRALGIA 05/12/2009   Gout, unspecified 05/11/2008   Essential hypertension 05/11/2008   KIDNEY DISEASE, CHRONIC NOS 05/11/2008   COUGH 04/17/2007   ESOPHAGEAL STRICTURE 01/23/2007   ADENOCARCINOMA, PROSTATE, HX OF 01/23/2007    Orientation RESPIRATION BLADDER Height & Weight     Self  Normal Incontinent, External catheter Weight: 170 lb (77.1 kg) Height:  5\' 10"  (177.8 cm)  BEHAVIORAL SYMPTOMS/MOOD NEUROLOGICAL BOWEL NUTRITION STATUS      Continent Diet (See DC Summary)  AMBULATORY STATUS COMMUNICATION OF NEEDS Skin   Limited Assist Verbally Normal                       Personal Care Assistance Level of Assistance  Bathing,  Feeding, Dressing Bathing Assistance: Limited assistance Feeding assistance: Independent Dressing Assistance: Limited assistance     Functional Limitations Info  Hearing, Sight, Speech Sight Info: Adequate Hearing Info: Impaired Speech Info: Adequate    SPECIAL CARE FACTORS FREQUENCY  PT (By licensed PT), OT (By licensed OT)     PT Frequency: 5x a week OT Frequency: 5x a week            Contractures Contractures Info: Not present    Additional Factors Info  Code Status, Allergies Code Status Info: Full Allergies Info: Sulfa Antibiotics, Sulfonamide Derivatives           Current Medications (04/23/2021):  This is the current hospital active medication list Current Facility-Administered Medications  Medication Dose Route Frequency Provider Last Rate Last Admin   allopurinol (ZYLOPRIM) tablet 300 mg  300 mg Oral QPM Smith, Rondell A, MD   300 mg at 04/22/21 1844   carvedilol (COREG) tablet 6.25 mg  6.25 mg Oral BID WC Smith, Rondell A, MD   6.25 mg at 04/22/21 1844   Chlorhexidine Gluconate Cloth 2 % PADS 6 each  6 each Topical Q0600 Modena Jansky, MD   6 each at 04/23/21 0600   cyanocobalamin ((VITAMIN B-12)) injection 1,000 mcg  1,000 mcg Intramuscular Daily Vernell Leep D, MD   1,000 mcg at 04/23/21 0941   levETIRAcetam (KEPPRA) IVPB 500 mg/100 mL premix  500 mg Intravenous Q12H Fuller Plan A, MD 400 mL/hr at 04/23/21 [phone]  500 mg at 04/23/21 0223   piperacillin-tazobactam (ZOSYN) IVPB 3.375 g  3.375 g Intravenous Q8H Heloise Purpura, RPH 12.5 mL/hr at 04/23/21 0704 3.375 g at 04/23/21 7680   sodium chloride flush (NS) 0.9 % injection 3 mL  3 mL Intravenous Q12H Smith, Rondell A, MD   3 mL at 04/23/21 0940   technetium albumin aggregated (MAA) injection solution 4.2 millicurie  4.2 millicurie Intravenous Once PRN Margarette Canada, MD         Discharge Medications: Please see discharge summary for a list of discharge medications.  Relevant Imaging  Results:  Relevant Lab Results:   Additional Information SSN: [id number]; Moderna COVID-19 Vaccine 01/06/2020 , 12/08/2019  Reece Agar, LCSWA

## 2021-04-23 NOTE — Evaluation (Signed)
Speech Language Pathology Evaluation Patient Details Name: [name] MRN: [id number] DOB: 25-May-1931 Today's Date: 04/23/2021 Time: 2094-7096 SLP Time Calculation (min) (ACUTE ONLY): 15 min  Problem List:  Patient Active Problem List   Diagnosis Date Noted   Acute metabolic encephalopathy 28/36/6294   Fall 04/21/2021   Anemia of chronic disease 04/21/2021   AKI (acute kidney injury) ([place]) 76/54/6503   Complicated UTI (urinary tract infection) 04/21/2021    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 01/06/14 01/06/2014   Colon cancer ([place]) 12/15/2013   Acute lower GI bleeding 11/14/2013   History of pulmonary embolism 11/14/2013   LOCALIZED SUPERFICIAL SWELLING MASS OR LUMP 09/30/2010   DIVERTICULAR DISEASE 06/21/2009   ACNE ROSACEA 05/12/2009   ARTHRALGIA 05/12/2009   Gout, unspecified 05/11/2008   Essential hypertension 05/11/2008   KIDNEY DISEASE, CHRONIC NOS 05/11/2008   COUGH 04/17/2007   ESOPHAGEAL STRICTURE 01/23/2007   ADENOCARCINOMA, PROSTATE, HX OF 01/23/2007   Past Medical History:  Past Medical History:  Diagnosis Date   Adenocarcinoma of colon ([place])    Arthritis    gout   CKD (chronic kidney disease), stage III (HCC)    Dermatitis    Diverticulitis    Gout    no recent flare   Hemorrhoids    History of blood transfusion    Hypertension    Incontinence    Peripheral vascular disease ([place]) 2011   DVT right leg   Prostate carcinoma (HCC)    Pulmonary embolus (HCC)    s/p hernia surgery 2012   Rosacea    Sciatica    Urinary retention    indwelling catheter   Wears dentures    upper    Wears glasses    Past Surgical History:  Past Surgical History:  Procedure Laterality Date   COLON SURGERY     COLONOSCOPY N/A 11/17/2013   Procedure: COLONOSCOPY;  Surgeon: Jeryl Columbia, MD;  Location: [facility];  Service: Endoscopy;  Laterality: N/A;   cyst removal L neck/shoulder     CYSTOSCOPY W/ RETROGRADES Bilateral 08/16/2018   Procedure:  CYSTOSCOPY WITH BILATERAL  RETROGRADE PYELOGRAM;  Surgeon: Lucas Mallow, MD;  Location: [facility] ORS;  Service: Urology;  Laterality: Bilateral;   diverticulitis     EYE SURGERY Bilateral    cataract extraction with IOL   hemorrhoids   1961, [place]   inguinal   HOT HEMOSTASIS N/A 11/17/2013   Procedure: HOT HEMOSTASIS (ARGON PLASMA COAGULATION/BICAP);  Surgeon: Jeryl Columbia, MD;  Location: [facility];  Service: Endoscopy;  Laterality: N/A;   ILEOSTOMY     LAPAROSCOPIC LYSIS OF ADHESIONS N/A 01/06/2014   Procedure: LAPAROSCOPIC LYSIS OF ADHESIONS;  Surgeon: Odis Hollingshead, MD;  Location: [facility];  Service: General;  Laterality: N/A;   LAPAROSCOPIC PARTIAL COLECTOMY N/A 01/06/2014   Procedure: LAPAROSCOPIC ASSISTED PARTIAL COLECTOMY;  Surgeon: Odis Hollingshead, MD;  Location: [facility];  Service: General;  Laterality: N/A;   PROSTATE REMOVED  1992   SHOULDER SURGERY     L bursa shoulder removal 2012    TONSILLECTOMY     TRANSURETHRAL RESECTION OF BLADDER TUMOR N/A 08/16/2018   Procedure: TRANSURETHRAL RESECTION OF BLADDER TUMOR (TURBT);  Surgeon: Lucas Mallow, MD;  Location: [facility];  Service: Urology;  Laterality: N/A;   TRANSURETHRAL RESECTION OF BLADDER TUMOR WITH MITOMYCIN-C N/A 02/03/2019   Procedure: TRANSURETHRAL RESECTION OF BLADDER TUMOR WITH GEMCITABINE;  Surgeon: Lucas Mallow, MD;  Location:  [facility] ORS;  Service: Urology;  Laterality: N/A;   upper teeth removal  2018   HPI:  87 yo male [facility] after pt found down at home by wife. 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 PMH includes Alzheimer's dementia, HTN, HLD, CKD III, bladder tumor s/p TURB, urinary retention with chronic Foley, gout, colon cancer, and PVD. MRI of head showed scattered strokes throughout multiple vascular territories.   Assessment / Plan / Recommendation Clinical Impression  Pt exhibiting moderate to severe cognitive  deficits; though unclear his cognitive baseline. Note hx of Alzheimers dementia with hx of falls. Also note acute infarctions this admission. Pt reportedly lives with spouse, no family or caregiver present during time of assessment; will continue efforts for clarifying cognitive baseline. Pt pleasantly confused, would arouse with cueing from SLP but sustained attention greatly impaired. Global cognitive deficits also including poor problem solving, poor safety awareness (necessitating 1:1 sitter), poor recall, and pt disorientation. Pt unable to participate in simple self feeding task (unable to sequence and unable to sustain attention). Pt able to verbally communicate without overt difficutly (no apparent dysathria of speech). Suspect however, pt has level of anomic aphasia due to advanced dementia with multiple CVAs. SLP to follow to clarify cognitive baseline and asssit with improved cognitive function/caregiver education if pt is clinically appropriate.    SLP Assessment  SLP Recommendation/Assessment: Patient needs continued Speech Lanaguage Pathology Services SLP Visit Diagnosis: Cognitive communication deficit (R41.841)    Follow Up Recommendations  24 hour supervision/assistance;Skilled Nursing facility (per PT/OT recommendations)    Frequency and Duration min 1 x/week  1 week      SLP Evaluation Cognition  Overall Cognitive Status: No family/caregiver present to determine baseline cognitive functioning Arousal/Alertness: Awake/alert Orientation Level: Oriented to person;Disoriented to time;Disoriented to place;Disoriented to situation Attention: Focused;Sustained Focused Attention: Impaired Focused Attention Impairment: Verbal basic;Functional basic Sustained Attention: Impaired Sustained Attention Impairment: Verbal basic;Functional basic Memory: Impaired Awareness: Impaired Problem Solving: Impaired Executive Function: Organizing;Self Monitoring Organizing: Impaired Self  Monitoring: Impaired Safety/Judgment: Impaired       Comprehension  Auditory Comprehension Overall Auditory Comprehension: Other (comment) (impaired unclear of baseline; no family member or caregiver present)    Expression Expression Primary Mode of Expression: Verbal Verbal Expression Overall Verbal Expression: Other (comment) (likely has some anomic aphasia due to advanced dementia) Initiation: No impairment Written Expression Dominant Hand: Right   Oral / Motor  Oral Motor/Sensory Function Overall Oral Motor/Sensory Function: Within functional limits Motor Speech Overall Motor Speech: Appears within functional limits for tasks assessed   GO                    Hayden Rasmussen MA, CCC-SLP Acute Rehabilitation Services   04/23/2021, 12:36 PM

## 2021-04-23 NOTE — Social Work (Cosign Needed Addendum)
RE: [name] Date of Birth: 01-Oct-1931 Date: 04/23/2021  Please be advised that the above-named patient will require a short-term nursing home stay - anticipated 30 days or less for rehabilitation and strengthening.  The plan is for return home.

## 2021-04-24 ENCOUNTER — Inpatient Hospital Stay (HOSPITAL_COMMUNITY): Payer: Medicare Other

## 2021-04-24 DIAGNOSIS — Z515 Encounter for palliative care: Secondary | ICD-10-CM

## 2021-04-24 DIAGNOSIS — I6389 Other cerebral infarction: Secondary | ICD-10-CM

## 2021-04-24 LAB — BASIC METABOLIC PANEL
Anion gap: 11 (ref 5–15)
BUN: 26 mg/dL — ABNORMAL HIGH (ref 8–23)
CO2: 22 mmol/L (ref 22–32)
Calcium: 9.5 mg/dL (ref 8.9–10.3)
Chloride: 106 mmol/L (ref 98–111)
Creatinine, Ser: 2.07 mg/dL — ABNORMAL HIGH (ref 0.61–1.24)
GFR, Estimated: 30 mL/min — ABNORMAL LOW (ref >60)
Glucose, Bld: 81 mg/dL (ref 70–99)
Potassium: 3.5 mmol/L (ref 3.5–5.1)
Sodium: 139 mmol/L (ref 135–145)

## 2021-04-24 LAB — ECHOCARDIOGRAM COMPLETE
AR max vel: 1.77 cm2
AV Area VTI: 1.75 cm2
AV Area mean vel: 1.73 cm2
AV Mean grad: 8.5 mmHg
AV Peak grad: 16 mmHg
Ao pk vel: 2 m/s
Area-P 1/2: 2.76 cm2
Height: 70 in
S' Lateral: 3.5 cm
Weight: 2720 oz

## 2021-04-24 LAB — URINE CULTURE: Culture: >=100000 — AB

## 2021-04-24 MED ORDER — LEVETIRACETAM IN NACL 500 MG/100ML IV SOLN
500.0000 mg | Freq: Two times a day (BID) | INTRAVENOUS | Status: DC
  Administered 2021-04-24 – 2021-04-25 (×3): 500 mg via INTRAVENOUS
  Filled 2021-04-24 (×3): qty 100

## 2021-04-24 MED ORDER — SODIUM CHLORIDE 0.9 % IV SOLN
1.0000 g | INTRAVENOUS | Status: DC
  Administered 2021-04-24 – 2021-04-25 (×2): 1 g via INTRAVENOUS
  Filled 2021-04-24 (×3): qty 1

## 2021-04-24 MED ORDER — LEVETIRACETAM IN NACL 1000 MG/100ML IV SOLN
1000.0000 mg | Freq: Once | INTRAVENOUS | Status: AC
  Administered 2021-04-24: 1000 mg via INTRAVENOUS
  Filled 2021-04-24: qty 100

## 2021-04-24 MED ORDER — LORAZEPAM 2 MG/ML IJ SOLN
0.5000 mg | Freq: Three times a day (TID) | INTRAMUSCULAR | Status: DC | PRN
  Administered 2021-04-24: 0.5 mg via INTRAVENOUS
  Filled 2021-04-24: qty 1

## 2021-04-24 MED ORDER — LEVETIRACETAM 500 MG PO TABS: 500.0000 mg | ORAL_TABLET | Freq: Two times a day (BID) | ORAL | Status: DC

## 2021-04-24 MED ORDER — POTASSIUM CHLORIDE IN NACL 20-0.9 MEQ/L-% IV SOLN
INTRAVENOUS | Status: DC
  Filled 2021-04-24 (×2): qty 1000

## 2021-04-24 MED ORDER — SODIUM CHLORIDE 0.9 % IV SOLN: INTRAVENOUS | Status: DC

## 2021-04-24 NOTE — Progress Notes (Signed)
PROGRESS NOTE   [name]  BUL:[id number]    DOB: Oct 01, 1931    DOA: 04/21/2021  PCP: Haywood Pao, MD   I have briefly reviewed patients previous medical records in [facility].  Chief Complaint  Patient presents with   Weakness   Altered Mental Status    Brief Narrative:  87 year old male with medical history of hypertension, hyperlipidemia, DVT/PE, CKD stage IIIb, Alzheimer's dementia, recurrent prostate cancer, bladder tumor s/p TURBT, urinary retention with chronic Foley, PAD, presented to the [facility] ED after an unwitnessed fall at home.  Wife found patient on the floor and could not get him up.  911 was called.  In the ED patient noted to have seizure-like activity/muscle spasm in left leg, received 1.5 mg of Ativan and loaded with 2 g of IV Keppra and transferred to [facility] ED for evaluation by neurology.  Admitted for seizure-like activity.  Further work-up revealed multiple cardioembolic appearing strokes and small subarachnoid hemorrhage.  Stroke team following.  Patient refusing to eat or take meds.  Overall poor prognosis.  Consulted palliative medicine for goals of care discussions.   Assessment & Plan:  Principal Problem:   Acute metabolic encephalopathy Active Problems:   Essential hypertension   ADENOCARCINOMA, PROSTATE, HX OF   History of pulmonary embolism   Fall   Anemia of chronic disease   AKI (acute kidney injury) ([place])   Complicated UTI (urinary tract infection)   Seizure-like activity: Initially felt to be polymyoclonus.  However further work-up has now revealed multiple cardioembolic appearing strokes and a small subarachnoid hemorrhage.  As per neurology, high right frontal stroke can possibly explain the left leg jerking movements which are likely more of weakness as well as irritation from the surrounding small SAH. CT head 6/30 without acute findings.  MRI brain 7/1: Small volume SAH.  Punctate foci of acute ischemia within the right cerebellar  hemisphere, right temporal lobe and medial left frontal lobe.  MRI C-spine: No high-grade spinal canal stenosis.  Since refusing p.o., changed Keppra to IV 500 mg twice daily.  Having left lower extremity involuntary movements today, updated stroke team.  Acute ischemic strokes: MRI brain as noted above.  MRA head: No aneurysm or intracranial artery occlusion.  CUS: Bilateral 1 to 39% ICA stenosis.  Bilateral vertebral arteries with antegrade flow. Echo: LVEF 32-12%, grade 1 diastolic dysfunction. A1C: 5.7.  LDL 51.  OT and SLP recommend SNF.  PT input pending.  Spouse prefers taking him home with home health services.  No antiplatelets due to [facility].  Stroke team following.  Small SAH: Follow-up CT head showed small amount of SAH without mass-effect.  MRA head without aneurysm.  Acute metabolic encephalopathy: Likely multifactorial related to multiple acute strokes, small SAH, med/Ativan in ED, UTI, complicating underlying dementia.  TSH normal.  Ongoing B12 supplementation.  Thiamine level pending.  Continuing Keppra-defer decision regarding continuing or discontinuing to neurology. Treating infectious/metabolic derangements as below.  Avoid sedative medications, delirium precautions.  EEG consistent with mild to moderate diffuse encephalopathy.  Remains pleasantly confused  B12 deficiency: B12 level: 131.  While hospitalized B12, 1000 mcg subcu daily x1 week, can switch to oral at time of discharge, then 1000 mcg orally daily.  Catheter associated Citrobacter UTI/complicated UTI/recurrent UTI: Changed Foley catheter 7/1.  Blood cultures x2: Negative to date.  Urine culture: >100 K colonies of Citrobacter-resistant to cefazolin, Bactrim and intermediate to nitrofurantoin.  As per ID pharmacy recommendation, changed to IV cefepime to complete total  7 days treatment.  Acute kidney injury complicating stage IIIb CKD: Baseline creatinine not clearly known but may be in the one-point 8-2 range.  Presented with  creatinine of 2.07.  Improved to 1.95 >1.97 >2.27.  Creatinine has plateaued, likely at baseline now.  Given poor oral intake, initiated gentle IVF pending [place] discussions  Fall at home: Details not known.  Could be related to involuntary movement of his left lower extremity.  Lack of adequate history.  In the ED reportedly noted to have sinus pause and ventricular bigeminy on EKG.  Telemetry shows sinus rhythm with frequent PACs.  D-dimer elevated.  VQ scan negative.  Continue telemetry-no significant issues noted.  Hypokalemia: Replaced.  Anemia of chronic disease: Stable  Alzheimer's dementia: No behavioral abnormalities currently.  Essential hypertension: Holding HCTZ due to AKI.  Resumed carvedilol.  History of DVT/PE: Reportedly provoked after surgery.  VQ scan negative this admission.  History of prostate cancer: Follows with Dr. Gloriann Loan, urology.  Mild dilatation of ascending aorta: 39 mm on echo.  Outpatient follow-up as deemed necessary.  Body mass index is 24.39 kg/m.   DVT prophylaxis: Place and maintain sequential compression device Start: 04/22/21 2018     Code Status: Full Code Family Communication: I discussed in detail with patient's spouse in detail on 7/2, updated care and answered all questions.  Disposition:  Status is: Inpatient  Remains inpatient appropriate because:Inpatient level of care appropriate due to severity of illness  Dispo: The patient is from: Home              Anticipated d/c is to: SNF              Patient currently is not medically stable to d/c.   Difficult to place patient No        Consultants:   Neurology  Procedures:   Change of Foley catheter 7/1  Antimicrobials:    Anti-infectives (From admission, onward)    Start     Dose/Rate Route Frequency Ordered Stop   04/24/21 0830  ceFEPIme (MAXIPIME) 1 g in sodium chloride 0.9 % 100 mL IVPB        1 g 200 mL/hr over 30 Minutes Intravenous Every 24 hours 04/24/21 0733 04/28/21  0829   04/21/21 2300  piperacillin-tazobactam (ZOSYN) IVPB 3.375 g  Status:  Discontinued        3.375 g 12.5 mL/hr over 240 Minutes Intravenous Every 8 hours 04/21/21 1434 04/24/21 0733   04/21/21 1500  piperacillin-tazobactam (ZOSYN) IVPB 3.375 g        3.375 g 100 mL/hr over 30 Minutes Intravenous  Once 04/21/21 1434 04/21/21 1641   04/21/21 1115  cefTRIAXone (ROCEPHIN) 1 g in sodium chloride 0.9 % 100 mL IVPB        1 g 200 mL/hr over 30 Minutes Intravenous  Once 04/21/21 1106 04/21/21 1242         Subjective:  Alert, oriented to self and partly to place "[place]".  Pleasantly confused.  Follows simple instructions.  "Are you the doctor?".  Per RN, refusing diet and meds since yesterday.  Patient however says he has not been unwilling to take.  Objective:   Vitals:   04/23/21 1936 04/23/21 2350 04/24/21 0402 04/24/21 1009  BP: (!) 142/87 (!) 161/101 (!) 152/100 122/83  Pulse: 95 92 85 94  Resp: 18 19 18    Temp: 98.3 F (36.8 C) 98.9 F (37.2 C) 98.7 F (37.1 C) 97.7 F (36.5 C)  TempSrc: Oral Axillary  Oral  SpO2: 100% 100% 100% 99%  Weight:      Height:        General exam: Early male, moderately built, frail and chronically ill looking lying comfortably supine in bed.  Appears to be in good spirits Respiratory system: Clear to auscultation.  No increased work of breathing. Cardiovascular system: S1 and S2 heard, RRR.  No JVD, murmurs or pedal edema.  Telemetry personally reviewed: Sinus rhythm with PACs. Gastrointestinal system: Abdomen is nondistended, soft and nontender. No organomegaly or masses felt. Normal bowel sounds heard. Central nervous system: Alert and oriented to self, partly to place.  No cranial nerve deficits. Extremities: Symmetric 5 x 5 power.  Ongoing left lower extremity involuntary movements. Skin: No rashes, lesions or ulcers Psychiatry: Judgement and insight impaired. Mood & affect pleasant. GU: Foley catheter in place with thigh  bag.    Data Reviewed:   I have personally reviewed following labs and imaging studies   CBC: Recent Labs  Lab 04/21/21 1050 04/22/21 0308 04/23/21 0356  WBC 8.2 7.6 7.6  NEUTROABS 6.8  --   --   HGB 9.8* 9.7* 9.8*  HCT 29.9* 29.5* 30.4*  MCV 92.3 91.3 91.8  PLT 200 192 798    Basic Metabolic Panel: Recent Labs  Lab 04/22/21 0308 04/23/21 0356 04/24/21 0341  NA 141 139 139  K 3.5 3.4* 3.5  CL 109 105 106  CO2 22 23 22   GLUCOSE 81 85 81  BUN 30* 27* 26*  CREATININE 1.95* 1.97* 2.07*  CALCIUM 9.4 9.6 9.5    Liver Function Tests: Recent Labs  Lab 04/21/21 1050  AST 16  ALT 12  ALKPHOS 73  BILITOT 0.6  PROT 6.5  ALBUMIN 3.5    CBG: Recent Labs  Lab 04/21/21 1037  GLUCAP 81    Microbiology Studies:   Recent Results (from the past 240 hour(s))  Urine culture     Status: Abnormal   Collection Time: 04/21/21 10:40 AM   Specimen: Urine, Clean Catch  Result Value Ref Range Status   Specimen Description   Final    URINE, CLEAN CATCH Performed at [facility], [place] [address] [place], [place] [address]    Special Requests   Final    NONE Performed at [facility] [place] [address] [place], [place] [address]    Culture (A)  Final    >=100,000 COLONIES/mL CITROBACTER FREUNDII >=100,000 COLONIES/mL AEROCOCCUS SPECIES Standardized susceptibility testing for this organism is not available. Performed at [facility], [place] [address] [place],  [address]    Report Status 04/24/2021 FINAL  Final   Organism ID, Bacteria CITROBACTER FREUNDII (A)  Final      Susceptibility   Citrobacter freundii - MIC*    CEFAZOLIN >=64 RESISTANT Resistant     CEFEPIME <=0.12 SENSITIVE Sensitive     CEFTRIAXONE 0.5 SENSITIVE Sensitive     CIPROFLOXACIN 1 SENSITIVE Sensitive     GENTAMICIN <=1 SENSITIVE Sensitive     IMIPENEM 0.5 SENSITIVE Sensitive     NITROFURANTOIN 64 INTERMEDIATE Intermediate     TRIMETH/SULFA >=320  RESISTANT Resistant     PIP/TAZO <=4 SENSITIVE Sensitive     * >=100,000 COLONIES/mL CITROBACTER FREUNDII  Culture, blood (routine x 2)     Status: None (Preliminary result)   Collection Time: 04/21/21 11:52 AM   Specimen: BLOOD  Result Value Ref Range Status   Specimen Description   Final    BLOOD RIGHT ANTECUBITAL Performed  at [facility], [place] [address] [place], [place] [address]    Special Requests   Final    BOTTLES DRAWN AEROBIC AND ANAEROBIC Blood Culture adequate volume Performed at [place] [address] [place], [place] [address]    Culture   Final    NO GROWTH 3 DAYS Performed at [facility], [place] [address] [place], [place] [address]    Report Status PENDING  Incomplete  Culture, blood (routine x 2)     Status: None (Preliminary result)   Collection Time: 04/21/21 11:52 AM   Specimen: BLOOD  Result Value Ref Range Status   Specimen Description   Final    BLOOD LEFT ANTECUBITAL Performed at [place] [address], [place], [place] [address]    Special Requests   Final    BOTTLES DRAWN AEROBIC AND ANAEROBIC Blood Culture adequate volume Performed at [place] [address], [place], [place] [address]    Culture   Final    NO GROWTH 3 DAYS Performed at [facility], [place] [address], [place], [place] [address]    Report Status PENDING  Incomplete  Resp Panel by RT-PCR (Flu A&B, Covid) Nasopharyngeal Swab     Status: None   Collection Time: 04/21/21 11:58 AM   Specimen: Nasopharyngeal Swab; Nasopharyngeal(NP) swabs in vial transport medium  Result Value Ref Range Status   SARS Coronavirus 2 by RT PCR NEGATIVE NEGATIVE Final    Comment: (NOTE) SARS-CoV-2 target nucleic acids are NOT DETECTED.  The SARS-CoV-2 RNA is generally detectable in upper respiratory specimens during the acute phase of infection. The lowest concentration of SARS-CoV-2 viral copies this  assay can detect is 138 copies/mL. A negative result does not preclude SARS-Cov-2 infection and should not be used as the sole basis for treatment or other patient management decisions. A negative result may occur with  improper specimen collection/handling, submission of specimen other than nasopharyngeal swab, presence of viral mutation(s) within the areas targeted by this assay, and inadequate number of viral copies(<138 copies/mL). A negative result must be combined with clinical observations, patient history, and epidemiological information. The expected result is Negative.  Fact Sheet for Patients:  EntrepreneurPulse.com.au  Fact Sheet for Healthcare Providers:  IncredibleEmployment.be  This test is no t yet approved or cleared by the [place] FDA and  has been authorized for detection and/or diagnosis of SARS-CoV-2 by FDA under an Emergency Use Authorization (EUA). This EUA will remain  in effect (meaning this test can be used) for the duration of the COVID-19 declaration under Section 564(b)(1) of the Act, 21 U.S.C.section 360bbb-3(b)(1), unless the authorization is terminated  or revoked sooner.       Influenza A by PCR NEGATIVE NEGATIVE Final   Influenza B by PCR NEGATIVE NEGATIVE Final    Comment: (NOTE) The Xpert Xpress SARS-CoV-2/FLU/RSV plus assay is intended as an aid in the diagnosis of influenza from Nasopharyngeal swab specimens and should not be used as a sole basis for treatment. Nasal washings and aspirates are unacceptable for Xpert Xpress SARS-CoV-2/FLU/RSV testing.  Fact Sheet for Patients: EntrepreneurPulse.com.au  Fact Sheet for Healthcare Providers: IncredibleEmployment.be  This test is not yet approved or cleared by the [place] FDA and has been authorized for detection and/or diagnosis of SARS-CoV-2 by FDA under an Emergency Use Authorization (EUA). This EUA will  remain in effect (meaning this test can be used) for the duration of the COVID-19 declaration under  Section 564(b)(1) of the Act, 21 U.S.C. section 360bbb-3(b)(1), unless the authorization is terminated or revoked.  Performed at [facility], [place] [address], [place], [place] [address]      Radiology Studies:  CT HEAD WO CONTRAST  Result Date: 04/22/2021 CLINICAL DATA:  Subarachnoid hemorrhage EXAM: CT HEAD WITHOUT CONTRAST TECHNIQUE: Contiguous axial images were obtained from the base of the skull through the vertex without intravenous contrast. COMPARISON:  04/21/2021 FINDINGS: Brain: There is a small amount of subarachnoid blood, as demonstrated on MRI. No mass effect. There is generalized atrophy and findings of chronic small vessel disease. Old anterior left temporal lobe infarct. Vascular: No abnormal hyperdensity of the major intracranial arteries or dural venous sinuses. No intracranial atherosclerosis. Skull: The visualized skull base, calvarium and extracranial soft tissues are normal. Sinuses/Orbits: No fluid levels or advanced mucosal thickening of the visualized paranasal sinuses. No mastoid or middle ear effusion. The orbits are normal. IMPRESSION: 1. Small amount of subarachnoid blood, as demonstrated on MRI. No mass effect. 2. Old anterior left temporal lobe infarct. Electronically Signed   By: Ulyses Jarred M.D.   On: 04/22/2021 21:37   MR ANGIO HEAD WO CONTRAST  Result Date: 04/22/2021 CLINICAL DATA:  Subarachnoid hemorrhage EXAM: MRA HEAD WITHOUT CONTRAST TECHNIQUE: Angiographic images of the Circle of Willis were acquired using MRA technique without intravenous contrast. COMPARISON:  No pertinent prior exam. FINDINGS: POSTERIOR CIRCULATION: --Vertebral arteries: Normal --Inferior cerebellar arteries: Normal. --Basilar artery: Normal. --Superior cerebellar arteries: Normal. --Posterior cerebral arteries: Normal. ANTERIOR CIRCULATION: --Intracranial internal  carotid arteries: Normal. --Anterior cerebral arteries (ACA): Normal. --Middle cerebral arteries (MCA): Normal. ANATOMIC VARIANTS: None Other: None. IMPRESSION: No aneurysm or intracranial arterial occlusion Electronically Signed   By: Ulyses Jarred M.D.   On: 04/22/2021 22:04   MR BRAIN WO CONTRAST  Addendum Date: 04/22/2021   ADDENDUM REPORT: 04/22/2021 20:16 ADDENDUM: Critical Value/emergent results were called by telephone at the time of interpretation on 04/22/2021 at 8:15 pm to provider Sal Lorrin Goodell, who verbally acknowledged these results. Electronically Signed   By: Ulyses Jarred M.D.   On: 04/22/2021 20:16   Result Date: 04/22/2021 CLINICAL DATA:  Encephalopathy. EXAM: MRI HEAD WITHOUT CONTRAST MRI CERVICAL SPINE WITHOUT CONTRAST TECHNIQUE: Multiplanar, multiecho pulse sequences of the brain and surrounding structures, and cervical spine, to include the craniocervical junction and cervicothoracic junction, were obtained without intravenous contrast. COMPARISON:  None. FINDINGS: MRI HEAD FINDINGS Brain: Punctate foci of restriction right cerebellar hemisphere, right temporal lobe and medial left frontal lobe. There is abnormal FLAIR hyperintensity subarachnoid space overlying both lobes and the right cingulate gyrus. There is multifocal hyperintense T2-weighted signal within the white matter. Diffuse, severe atrophy. There is an old left temporal lobe infarct. The midline structures are normal. There is chronic hemosiderin deposition over both anterior hemispheres, right greater than left. Vascular: Major flow voids are preserved. Skull and upper cervical spine: Normal calvarium and skull base. Visualized upper cervical spine and soft tissues are normal. Sinuses/Orbits:No paranasal sinus fluid levels or advanced mucosal thickening. No mastoid or middle ear effusion. Normal orbits. MRI CERVICAL SPINE FINDINGS Truncated and motion degraded examination. Alignment: Physiologic. Vertebrae: No fracture,  evidence of discitis, or bone lesion. Cord: Normal signal and morphology. Posterior Fossa, vertebral arteries, paraspinal tissues: Poor visualization of vertebral arteries due to motion. Otherwise unremarkable. Disc levels: Assessment of the disc levels is severely degraded by motion. Within that limitation, there is no high-grade spinal canal stenosis. IMPRESSION: 1. Small volume subarachnoid hemorrhage. 2. Punctate foci  of acute ischemia within the right cerebellar hemisphere, right temporal lobe and medial left frontal lobe. 3. Severe atrophy with findings of chronic small vessel ischemia. 4. No high-grade spinal canal stenosis. Electronically Signed: By: Ulyses Jarred M.D. On: 04/22/2021 20:08   MR CERVICAL SPINE WO CONTRAST  Addendum Date: 04/22/2021   ADDENDUM REPORT: 04/22/2021 20:16 ADDENDUM: Critical Value/emergent results were called by telephone at the time of interpretation on 04/22/2021 at 8:15 pm to provider Sal Lorrin Goodell, who verbally acknowledged these results. Electronically Signed   By: Ulyses Jarred M.D.   On: 04/22/2021 20:16   Result Date: 04/22/2021 CLINICAL DATA:  Encephalopathy. EXAM: MRI HEAD WITHOUT CONTRAST MRI CERVICAL SPINE WITHOUT CONTRAST TECHNIQUE: Multiplanar, multiecho pulse sequences of the brain and surrounding structures, and cervical spine, to include the craniocervical junction and cervicothoracic junction, were obtained without intravenous contrast. COMPARISON:  None. FINDINGS: MRI HEAD FINDINGS Brain: Punctate foci of restriction right cerebellar hemisphere, right temporal lobe and medial left frontal lobe. There is abnormal FLAIR hyperintensity subarachnoid space overlying both lobes and the right cingulate gyrus. There is multifocal hyperintense T2-weighted signal within the white matter. Diffuse, severe atrophy. There is an old left temporal lobe infarct. The midline structures are normal. There is chronic hemosiderin deposition over both anterior hemispheres, right  greater than left. Vascular: Major flow voids are preserved. Skull and upper cervical spine: Normal calvarium and skull base. Visualized upper cervical spine and soft tissues are normal. Sinuses/Orbits:No paranasal sinus fluid levels or advanced mucosal thickening. No mastoid or middle ear effusion. Normal orbits. MRI CERVICAL SPINE FINDINGS Truncated and motion degraded examination. Alignment: Physiologic. Vertebrae: No fracture, evidence of discitis, or bone lesion. Cord: Normal signal and morphology. Posterior Fossa, vertebral arteries, paraspinal tissues: Poor visualization of vertebral arteries due to motion. Otherwise unremarkable. Disc levels: Assessment of the disc levels is severely degraded by motion. Within that limitation, there is no high-grade spinal canal stenosis. IMPRESSION: 1. Small volume subarachnoid hemorrhage. 2. Punctate foci of acute ischemia within the right cerebellar hemisphere, right temporal lobe and medial left frontal lobe. 3. Severe atrophy with findings of chronic small vessel ischemia. 4. No high-grade spinal canal stenosis. Electronically Signed: By: Ulyses Jarred M.D. On: 04/22/2021 20:08   ECHOCARDIOGRAM COMPLETE  Result Date: 04/24/2021    ECHOCARDIOGRAM REPORT   Patient Name:   KAIAN FAHS Date of Exam: 04/24/2021 Medical Rec #:  [id number]        Height:       70.0 in Accession #:    [id number]       Weight:       170.0 lb Date of Birth:  26-Dec-1930       BSA:          1.948 m Patient Age:    28 years         BP:           152/100 mmHg Patient Gender: M                HR:           85 bpm. Exam Location:  Inpatient Procedure: 2D Echo, Cardiac Doppler and Color Doppler Indications:    [place]  History:        Patient has no prior history of Echocardiogram examinations.                 Risk Factors:Hypertension.  Sonographer:    Cammy Brochure Referring Phys: [id number] [facility]  Sonographer Comments: Image acquisition  challenging due to uncooperative patient.  IMPRESSIONS  1. Calcified aortic valve with reduced excursion of left coronary cusp; no significant AS by doppler.  2. Left ventricular ejection fraction, by estimation, is 55 to 60%. The left ventricle has normal function. The left ventricle has no regional wall motion abnormalities. There is mild left ventricular hypertrophy. Left ventricular diastolic parameters are consistent with Grade I diastolic dysfunction (impaired relaxation).  3. Right ventricular systolic function is normal. The right ventricular size is normal.  4. The mitral valve is normal in structure. Trivial mitral valve regurgitation. No evidence of mitral stenosis.  5. The aortic valve is calcified. Aortic valve regurgitation is mild. No aortic stenosis is present.  6. Aortic dilatation noted. There is mild dilatation of the ascending aorta, measuring 39 mm.  7. The inferior vena cava is normal in size with greater than 50% respiratory variability, suggesting right atrial pressure of 3 mmHg. FINDINGS  Left Ventricle: Left ventricular ejection fraction, by estimation, is 55 to 60%. The left ventricle has normal function. The left ventricle has no regional wall motion abnormalities. The left ventricular internal cavity size was normal in size. There is  mild left ventricular hypertrophy. Left ventricular diastolic parameters are consistent with Grade I diastolic dysfunction (impaired relaxation). Right Ventricle: The right ventricular size is normal.Right ventricular systolic function is normal. Left Atrium: Left atrial size was normal in size. Right Atrium: Right atrial size was normal in size. Pericardium: There is no evidence of pericardial effusion. Mitral Valve: The mitral valve is normal in structure. Trivial mitral valve regurgitation. No evidence of mitral valve stenosis. Tricuspid Valve: The tricuspid valve is normal in structure. Tricuspid valve regurgitation is not demonstrated. No evidence of tricuspid stenosis. Aortic Valve: The aortic  valve is calcified. Aortic valve regurgitation is mild. No aortic stenosis is present. Aortic valve mean gradient measures 8.5 mmHg. Aortic valve peak gradient measures 16.0 mmHg. Pulmonic Valve: The pulmonic valve was not well visualized. Pulmonic valve regurgitation is not visualized. No evidence of pulmonic stenosis. Aorta: Aortic dilatation noted. There is mild dilatation of the ascending aorta, measuring 39 mm. Venous: The inferior vena cava is normal in size with greater than 50% respiratory variability, suggesting right atrial pressure of 3 mmHg. IAS/Shunts: The interatrial septum is aneurysmal. No atrial level shunt detected by color flow Doppler. Additional Comments: Calcified aortic valve with reduced excursion of left coronary cusp; no significant AS by doppler.  LEFT VENTRICLE PLAX 2D LVIDd:         4.50 cm  Diastology LVIDs:         3.50 cm  LV e' medial:    5.98 cm/s LV PW:         1.30 cm  LV E/e' medial:  7.5 LV IVS:        1.30 cm  LV e' lateral:   6.64 cm/s LVOT diam:     2.20 cm  LV E/e' lateral: 6.8 LV SV:         77 LV SV Index:   40 LVOT Area:     3.80 cm  RIGHT VENTRICLE RV S prime:     16.30 cm/s TAPSE (M-mode): 1.8 cm LEFT ATRIUM             Index LA diam:        3.40 cm 1.75 cm/m LA Vol (A2C):   67.3 ml 34.55 ml/m LA Vol (A4C):   48.9 ml 25.10 ml/m LA Biplane Vol: 59.1 ml 30.34 ml/m  AORTIC  VALVE AV Area (Vmax):    1.77 cm AV Area (Vmean):   1.73 cm AV Area (VTI):     1.75 cm AV Vmax:           200.00 cm/s AV Vmean:          131.500 cm/s AV VTI:            0.440 m AV Peak Grad:      16.0 mmHg AV Mean Grad:      8.5 mmHg LVOT Vmax:         93.30 cm/s LVOT Vmean:        59.700 cm/s LVOT VTI:          0.203 m LVOT/AV VTI ratio: 0.46  AORTA Ao Root diam: 3.70 cm Ao Asc diam:  3.90 cm MITRAL VALVE MV Area (PHT): 2.76 cm    SHUNTS MV Decel Time: 275 msec    Systemic VTI:  0.20 m MV E velocity: 45.10 cm/s  Systemic Diam: 2.20 cm MV A velocity: 78.60 cm/s MV E/A ratio:  0.57 Kirk Ruths  MD Electronically signed by Kirk Ruths MD Signature Date/Time: 04/24/2021/11:58:40 AM    Final    VAS US CAROTID  Result Date: 04/23/2021 Carotid Arterial Duplex Study Patient Name:  ALEJANDRA BARNA  Date of Exam:   04/23/2021 Medical Rec #: [id number]         Accession #:    [id number] Date of Birth: 1930-12-16        Patient Gender: M Patient Age:   89Y Exam Location:  [facility] Procedure:      VAS US CAROTID Referring Phys: [id number] [place] --------------------------------------------------------------------------------  Indications:       CVA. Risk Factors:      Hypertension. Limitations        Today's exam was limited due to the patient's respiratory                    variation and patient somnolence, patient movement, patient                    positioning. Comparison Study:  No prior studies. Performing Technologist: Oliver Hum RVT  Examination Guidelines: A complete evaluation includes B-mode imaging, spectral Doppler, color Doppler, and power Doppler as needed of all accessible portions of each vessel. Bilateral testing is considered an integral part of a complete examination. Limited examinations for reoccurring indications may be performed as noted.  Right Carotid Findings: +----------+--------+--------+--------+-----------------------+--------+           PSV cm/sEDV cm/sStenosisPlaque Description     Comments +----------+--------+--------+--------+-----------------------+--------+ CCA Prox  70      12              smooth and heterogenoustortuous +----------+--------+--------+--------+-----------------------+--------+ CCA Distal43      11              smooth and heterogenous         +----------+--------+--------+--------+-----------------------+--------+ ICA Prox  40      16              smooth and heterogenous         +----------+--------+--------+--------+-----------------------+--------+ ICA Distal43      12                                      tortuous +----------+--------+--------+--------+-----------------------+--------+ ECA       63  12                                              +----------+--------+--------+--------+-----------------------+--------+ +----------+--------+-------+--------+-------------------+           PSV cm/sEDV cmsDescribeArm Pressure (mmHG) +----------+--------+-------+--------+-------------------+ [id number]                                         +----------+--------+-------+--------+-------------------+ +---------+--------+--+--------+-+---------+ VertebralPSV cm/s23EDV cm/s5Antegrade +---------+--------+--+--------+-+---------+  Left Carotid Findings: +----------+--------+--------+--------+-----------------------+--------+           PSV cm/sEDV cm/sStenosisPlaque Description     Comments +----------+--------+--------+--------+-----------------------+--------+ CCA Prox  55      12              smooth and heterogenous         +----------+--------+--------+--------+-----------------------+--------+ CCA Distal55      11              smooth and heterogenous         +----------+--------+--------+--------+-----------------------+--------+ ICA Prox  44      11              smooth and heterogenous         +----------+--------+--------+--------+-----------------------+--------+ ICA Distal49      8                                      tortuous +----------+--------+--------+--------+-----------------------+--------+ ECA       73      10                                              +----------+--------+--------+--------+-----------------------+--------+ +----------+--------+--------+--------+-------------------+           PSV cm/sEDV cm/sDescribeArm Pressure (mmHG) +----------+--------+--------+--------+-------------------+ Subclavian100                                         +----------+--------+--------+--------+-------------------+  +---------+--------+--+--------+--+---------+ VertebralPSV cm/s63EDV cm/s17Antegrade +---------+--------+--+--------+--+---------+   Summary: Right Carotid: Velocities in the right ICA are consistent with a 1-39% stenosis. Left Carotid: Velocities in the left ICA are consistent with a 1-39% stenosis. Vertebrals: Bilateral vertebral arteries demonstrate antegrade flow. *See table(s) above for measurements and observations.     Preliminary      Scheduled Meds:    allopurinol  300 mg Oral QPM   carvedilol  6.25 mg Oral BID WC   Chlorhexidine Gluconate Cloth  6 each Topical Q0600   cyanocobalamin  1,000 mcg Intramuscular Daily   sodium chloride flush  3 mL Intravenous Q12H    Continuous Infusions:    0.9 % NaCl with KCl 20 mEq / L 50 mL/hr at 04/24/21 0732   ceFEPime (MAXIPIME) IV 1 g (04/24/21 0941)   levETIRAcetam 1,000 mg (04/24/21 1202)   levETIRAcetam 500 mg (04/24/21 1111)     LOS: 3 days     Vernell Leep, MD, [place], [facility]. [facility]    To contact the attending provider between 7A-7P or the covering provider during after hours 7P-7A, please log into the web site [email or web address]  and access using universal [place] password for that web site. If you do not have the password, please call the hospital operator.  04/24/2021, 12:04 PM

## 2021-04-24 NOTE — Consult Note (Signed)
Consultation Note Date: 04/24/2021   Patient Name: [name]  DOB: Jul 02, 1931  MRN: [id number]  Age / Sex: 87 y.o., male  PCP: Tisovec, Fransico Him, MD Referring Physician: Modena Jansky, MD  Reason for Consultation: Establishing goals of care  HPI/Patient Profile: 87 y.o. male  with past medical history of prostate cancer, DVT/PE, PVD, CKD, colon cancer, Alzheimer's dementia, chronic foley with recurrent UTIs, PAD who was admitted on 04/21/2021 after being found on the floor by his wife.   Imaging revealed multiple small strokes and a small subarachnoid hemorrhage.  He has been suffering with a continuous jerking thru his abdomen and down his leg.   He appears to have no interest in eating or drinking despite the Nursing staff trying to feed him.  Clinical Assessment and Goals of Care: I have reviewed medical records including EPIC notes, labs and imaging, received report from RN, assessed the patient and spoke on the phone with patient's wife, [name], to discuss diagnosis prognosis, [place], EOL wishes, disposition and options.  First we met [name] at bedside.  He is able to give [place] his name, but not his location.  We explain that he is at [facility].  He does not know why he is here.  He fiddles with his gown and has jerking motions in his abdomen and leg while we speak to him.  He is responsive and pleasant.  I called [name] on the telephone.  She was quite surprised by our phone call and the information that her husband is not eating.  We suggested that if this continues he may be eligible for a Hospice facility rather than SNF.   Mrs. Sok wants very much to come to the hospital to visit her husband.  This is very difficult for her as she is preparing for back surgery in the near future.  She asked if we could include [name] (a close family friend and church nurse) in our conversation.    We  made a plan to touch base again tomorrow morning at 9:30 and establish a time to meet in person at the hospital.   I left a voice mail for [name] asking if she would be willing to be a part of our conversation.  Primary Decision Maker:  NEXT OF KIN Wife [name].    SUMMARY OF RECOMMENDATIONS    Continue current care. PMT attempting to arrange family meeting Mon/Tues. Will review a MOST form and determine how patient and wife would like to move forward.  I'm very concerned that he is a very poor candidate for artificial feeding and hydration thus, he may be appropriate for [place].  Code Status/Advance Care Planning: Full   Symptom Management:  Does not complain of pain or discomfort.   Try low dose ativan to see if that allows the involuntary myoclonic jerking to lessen.  Patient is currently on Keppra but this is not controlling the symptom. Will formally request feeding assistance for Mr. Busk.  Additional Recommendations (Limitations, Scope,  Preferences): Full Scope Treatment  Palliative Prophylaxis:  Aspiration, Delirium Protocol, Frequent Pain Assessment, and Turn Reposition  Psycho-social/Spiritual:  Desire for further Chaplaincy support: not yet discussed.  Prognosis:  if he is not eating or drinking his prognosis is short and he is eligible for Hospice facility.   Discharge Planning: To Be Determined      Primary Diagnoses: Present on Admission:  Acute metabolic encephalopathy  Fall  History of pulmonary embolism  Essential hypertension  Anemia of chronic disease  AKI (acute kidney injury) ([place])  Complicated UTI (urinary tract infection)   I have reviewed the medical record, interviewed the patient and family, and examined the patient. The following aspects are pertinent.  Past Medical History:  Diagnosis Date   Adenocarcinoma of colon ([place])    Arthritis    gout   CKD (chronic kidney disease), stage III (HCC)    Dermatitis     Diverticulitis    Gout    no recent flare   Hemorrhoids    History of blood transfusion    Hypertension    Incontinence    Peripheral vascular disease ([place]) 2011   DVT right leg   Prostate carcinoma (HCC)    Pulmonary embolus (HCC)    s/p hernia surgery 2012   Rosacea    Sciatica    Urinary retention    indwelling catheter   Wears dentures    upper    Wears glasses    Social History   Socioeconomic History   Marital status: Married    Spouse name: Not on file   Number of children: Not on file   Years of education: Not on file   Highest education level: Not on file  Occupational History   Not on file  Tobacco Use   Smoking status: Former    Pack years: 0.00    Types: Cigarettes, Pipe    Quit date: 01/02/1980    Years since quitting: 41.3   Smokeless tobacco: Never  Vaping Use   Vaping Use: Never used  Substance and Sexual Activity   Alcohol use: No   Drug use: No   Sexual activity: Not on file  Other Topics Concern   Not on file  Social History Narrative   Not on file   Social Determinants of Health   Financial Resource Strain: Not on file  Food Insecurity: Not on file  Transportation Needs: Not on file  Physical Activity: Not on file  Stress: Not on file  Social Connections: Not on file   Family History  Problem Relation Age of Onset   Prostate cancer Father     Allergies  Allergen Reactions   Sulfa Antibiotics Rash   Sulfonamide Derivatives Rash     Vital Signs: BP (!) 145/87   Pulse 85   Temp 98 F (36.7 C) (Oral)   Resp 18   Ht $R'5\' 10"'mH$  (1.778 m)   Wt 77.1 kg   SpO2 98%   BMI 24.39 kg/m  Pain Scale: 0-10   Pain Score: 0-No pain   SpO2: SpO2: 98 % O2 Device:SpO2: 98 % O2 Flow Rate: .     Palliative Assessment/Data:  20%     Time In: 4:00 Time Out: 5:00 Time Total: 60 min. Visit consisted of counseling and education dealing with the complex and emotionally intense issues surrounding the need for palliative care and symptom  management in the setting of serious and potentially life-threatening illness. Greater than 50%  of this time was spent counseling and  coordinating care related to the above assessment and plan.  Signed by: Florentina Jenny, PA-C Palliative Medicine  Please contact Palliative Medicine Team phone at [phone] for questions and concerns.  For individual provider: See Shea Evans

## 2021-04-24 NOTE — Progress Notes (Addendum)
STROKE TEAM PROGRESS NOTE   INTERVAL HISTORY His Echo tech is at the bedside. He is calm, remains confused and agitated at times. He has been refusing meds and has missed few doses now of Keppra and there has been return of some focal seizure-like activity in LLE. Will load with IV keppra now, and will need daily doing via IV to maintain. Agree with PC consult at this time.  Vitals:   04/23/21 1936 04/23/21 2350 04/24/21 0402 04/24/21 1009  BP: (!) 142/87 (!) 161/101 (!) 152/100 122/83  Pulse: 95 92 85 94  Resp: 18 19 18    Temp: 98.3 F (36.8 C) 98.9 F (37.2 C) 98.7 F (37.1 C) 97.7 F (36.5 C)  TempSrc: Oral Axillary  Oral  SpO2: 100% 100% 100% 99%  Weight:      Height:       CBC:  Recent Labs  Lab 04/21/21 1050 04/22/21 0308 04/23/21 0356  WBC 8.2 7.6 7.6  NEUTROABS 6.8  --   --   HGB 9.8* 9.7* 9.8*  HCT 29.9* 29.5* 30.4*  MCV 92.3 91.3 91.8  PLT 200 192 093    Basic Metabolic Panel:  Recent Labs  Lab 04/23/21 0356 04/24/21 0341  NA 139 139  K 3.4* 3.5  CL 105 106  CO2 23 22  GLUCOSE 85 81  BUN 27* 26*  CREATININE 1.97* 2.07*  CALCIUM 9.6 9.5    Lipid Panel:  Recent Labs  Lab 04/23/21 0356  CHOL 104  TRIG 60  HDL 41  CHOLHDL 2.5  VLDL 12  LDLCALC 51    HgbA1c:  Recent Labs  Lab 04/23/21 0356  HGBA1C 5.7*    Urine Drug Screen: No results for input(s): LABOPIA, COCAINSCRNUR, LABBENZ, AMPHETMU, THCU, LABBARB in the last 168 hours.  Alcohol Level No results for input(s): ETH in the last 168 hours. EEG impression from 04/21/2021: "This study is suggestive of mild to moderate diffuse encephalopathy, nonspecific etiology.  Two episodes were noted as described above without concomitant EEG change and were not epileptic.  No seizures or epileptiform discharges were seen throughout the recording." IMAGING past 24 hours No results found.  PHYSICAL EXAM General: Appears frail, elderly. No acute distress Psych: Affect appropriate to situation Eyes: No  scleral injection HENT: No OP obstrucion Head: Normocephalic.  Cardiovascular: Normal rate and regular rhythm.  Respiratory: Effort normal and breath sounds normal to anterior ascultation GI: Soft.  No distension. There is no tenderness.  Skin: WDI    Neurological Examination Mental Status: Somnolent, but wakes to voice. Mumbles name only. Speech is sparse, but seems fluent, slightly dysarthric. Follows simple commands, but unable to preform complex commands Cranial Nerves: PERRL, 74mm. EOMI. VFF. Face is symmetric, denies sensation change. Very HOH. Motor:Tone and bulk:normal tone throughout for age. Some gen weakness noted throughout, but LUE with drift. Grips feel equal, but limited as he poorly follows commands. Sensory:  light touch intact throughout, bilaterally Deep Tendon Reflexes: 2+ and symmetric throughout Plantars: Right: downgoing   Left: downgoing Cerebellar:unable; no gross ataxia Gait: defered  ASSESSMENT/PLAN Mr. KAYIN OSMENT is a 87 y.o. male with history of dementia presenting after being found down with seizure-like activity. He received Ativan and Keppra load, now on keppra 500mg  BID and no further seizure activity reported. Then after missed doses of keppra (d/t refusing po) LLE focal seizure-like activity returned on 7/3. Loaded again with IV keppra and switched malignance doses.  MRI was done as part of seizure work up and  scattered strokes found in multiple vascular territories and at least two small areas of hemorrhage, which raises concern for CAA using the [facility] Criteria given his dementia at baseline.   Stroke: Multiple cardioembolic appearing strokes. Etiology undetermined at this time; work up underway. Code Stroke CT head No acute abnormality. Small vessel disease. Atrophy. Left temporal encephalomalacia MRI  Small volume hemorrhage in left frontal, but upon review there may be other microhemmorhages, but difficult to tell given poor quality of MR  which is degraded by movement. Punctate foci of acute ischemia within the right cerebellar hemisphere, right temporal lobe and medial left frontal lobe. MRA  neg Carotid Doppler  neg; no stenosis 2D Echo 55% EF, Aortic dilation and calcification noted LDL 51 HgbA1c 5.7 VTE prophylaxis - scds; holding chemoppx d/t some hemorrhage noted on MRI    Diet   Diet Heart Room service appropriate? Yes; Fluid consistency: Thin   aspirin 81 mg daily prior to admission, now on  Holding d/t bleed .  Therapy recommendations:  pending GOC discussion Disposition:  pending  Hypertension Home meds:  Coreg, hydrodiuril Stable Permissive hypertension (OK if < 220/120) but gradually normalize in 5-7 days Long-term BP goal normotensive  Hyperlipidemia Home meds:  Fish oil LDL 51, goal < 70 High intensity statin not indicated Continue statin at discharge  Diabetes type II - no dx Home meds:  none HgbA1c 5.7, goal < 7.0 CBGs No results for input(s): GLUCAP in the last 72 hours.   SSI  Other Stroke Risk Factors Advanced Age >/= 3  Hx stroke/TIA Family hx stroke   Other Active Problems Seizures- continue on keppra 500mg  BID, switch to IV after 1g load today d/t missed po doses H/o prostate ca- Chronic foley catheter with freq UTIs AKI on DKD3- monitoring Both hypo and hyperactive delirium w/agitation- Continue Seroquel QHS and PRN Encephalopathy- multi-factorial  Vascular Dementia with what appears to be FTT; refusing meds & food.  Vit B12 deficiency-supplement Goals of Care are needed. Agree with Palliative Care consult  Hospital day # 3  Eimy Plaza Metzger-Cihelka, ARNP-C, ANVP-BC Pager: [phone]   To contact Stroke Continuity provider, please refer to [email or web address] After hours, contact General Neurology

## 2021-04-24 NOTE — Progress Notes (Addendum)
Pharmacy Antibiotic Note  [name] is a 87 y.o. male admitted on 04/21/2021 with UTI.  Pharmacy has been consulted for cefepime dosing.  Pt has been here for [facility] CVA. He has been on zosyn for this UTI due to chronic foley. It was changed out on 7/1. Culture came back with citrobacter freundii. This species is often associated AmpC beta lactamase. D/w Dr. Algis Liming and we will optimize to cefepime to complete 7d.   CrCl~26  Plan: Dc zosyn Cefepime 1g IV q24 x4 doses Rx signs off  Height: 5\' 10"  (177.8 cm) Weight: 77.1 kg (170 lb) IBW/kg (Calculated) : 73  Temp (24hrs), Avg:98.7 F (37.1 C), Min:98.3 F (36.8 C), Max:99.2 F (37.3 C)  Recent Labs  Lab 04/21/21 1050 04/22/21 0308 04/23/21 0356 04/24/21 0341  WBC 8.2 7.6 7.6  --   CREATININE 2.07* 1.95* 1.97* 2.07*  LATICACIDVEN 1.2  --   --   --     Estimated Creatinine Clearance: 26 mL/min (A) (by C-G formula based on SCr of 2.07 mg/dL (H)).    Allergies  Allergen Reactions   Sulfa Antibiotics Rash   Sulfonamide Derivatives Rash    Antimicrobials this admission: 6/30 zosyn>>7/3 7/3 cefepime>>7/6  Microbiology results: 6/30- blood>>ngtd 630 urine- citrobacter  Onnie Boer, PharmD, BCIDP, AAHIVP, CPP Infectious Disease Pharmacist 04/24/2021 7:39 AM

## 2021-04-24 NOTE — Progress Notes (Signed)
Secure chat sent to RN. Educated on the compatibility of potassium and zosyn, and they may be infused together. Verbalized understanding, and expressed if the need arises for a PIV to add a consult.

## 2021-04-25 ENCOUNTER — Encounter: Payer: Self-pay | Admitting: *Deleted

## 2021-04-25 DIAGNOSIS — I6389 Other cerebral infarction: Secondary | ICD-10-CM

## 2021-04-25 MED ORDER — LORAZEPAM 2 MG/ML IJ SOLN: 1.0000 mg | INTRAMUSCULAR | Status: DC | PRN

## 2021-04-25 MED ORDER — LEVETIRACETAM IN NACL 500 MG/100ML IV SOLN: 500.0000 mg | Freq: Once | INTRAVENOUS | Status: DC

## 2021-04-25 MED ORDER — LEVETIRACETAM IN NACL 1000 MG/100ML IV SOLN
1000.0000 mg | Freq: Two times a day (BID) | INTRAVENOUS | Status: DC
  Administered 2021-04-25 – 2021-04-29 (×8): 1000 mg via INTRAVENOUS
  Filled 2021-04-25 (×8): qty 100

## 2021-04-25 MED ORDER — LEVETIRACETAM IN NACL 500 MG/100ML IV SOLN
500.0000 mg | Freq: Once | INTRAVENOUS | Status: AC
  Administered 2021-04-25: 500 mg via INTRAVENOUS
  Filled 2021-04-25: qty 100

## 2021-04-25 MED ORDER — HYDROMORPHONE HCL 1 MG/ML IJ SOLN: 0.5000 mg | INTRAMUSCULAR | Status: DC | PRN

## 2021-04-25 MED ORDER — LORAZEPAM 2 MG/ML PO CONC
1.0000 mg | ORAL | Status: DC | PRN
Start: 2021-04-25 — End: 2021-04-25

## 2021-04-25 MED ORDER — HALOPERIDOL LACTATE 5 MG/ML IJ SOLN: 0.5000 mg | INTRAMUSCULAR | Status: DC | PRN

## 2021-04-25 MED ORDER — POLYVINYL ALCOHOL 1.4 % OP SOLN
1.0000 [drp] | Freq: Four times a day (QID) | OPHTHALMIC | Status: DC | PRN
  Filled 2021-04-25: qty 15

## 2021-04-25 MED ORDER — SODIUM CHLORIDE 0.9% FLUSH
3.0000 mL | Freq: Two times a day (BID) | INTRAVENOUS | Status: DC
  Administered 2021-04-25 – 2021-04-26 (×3): 3 mL via INTRAVENOUS

## 2021-04-25 MED ORDER — HALOPERIDOL 0.5 MG PO TABS: 0.5000 mg | ORAL_TABLET | ORAL | Status: DC | PRN

## 2021-04-25 MED ORDER — HALOPERIDOL LACTATE 2 MG/ML PO CONC
0.5000 mg | ORAL | Status: DC | PRN
  Filled 2021-04-25: qty 0.3

## 2021-04-25 MED ORDER — ACETAMINOPHEN 325 MG PO TABS: 650.0000 mg | ORAL_TABLET | Freq: Four times a day (QID) | ORAL | Status: DC | PRN

## 2021-04-25 MED ORDER — GLYCOPYRROLATE 1 MG PO TABS
1.0000 mg | ORAL_TABLET | ORAL | Status: DC | PRN
  Filled 2021-04-25: qty 1

## 2021-04-25 MED ORDER — ONDANSETRON 4 MG PO TBDP: 4.0000 mg | ORAL_TABLET | Freq: Four times a day (QID) | ORAL | Status: DC | PRN

## 2021-04-25 MED ORDER — LORAZEPAM 2 MG/ML PO CONC: 1.0000 mg | ORAL | Status: DC | PRN

## 2021-04-25 MED ORDER — HYDROMORPHONE HCL 1 MG/ML IJ SOLN: 1.0000 mg | INTRAMUSCULAR | Status: DC | PRN

## 2021-04-25 MED ORDER — LORAZEPAM 1 MG PO TABS: 1.0000 mg | ORAL_TABLET | ORAL | Status: DC | PRN

## 2021-04-25 MED ORDER — ONDANSETRON HCL 4 MG/2ML IJ SOLN: 4.0000 mg | Freq: Four times a day (QID) | INTRAMUSCULAR | Status: DC | PRN

## 2021-04-25 MED ORDER — GLYCOPYRROLATE 0.2 MG/ML IJ SOLN: 0.2000 mg | INTRAMUSCULAR | Status: DC | PRN

## 2021-04-25 MED ORDER — ACETAMINOPHEN 650 MG RE SUPP: 650.0000 mg | Freq: Four times a day (QID) | RECTAL | Status: DC | PRN

## 2021-04-25 MED ORDER — SODIUM CHLORIDE 0.9 % IV SOLN: 250.0000 mL | INTRAVENOUS | Status: DC | PRN

## 2021-04-25 MED ORDER — SODIUM CHLORIDE 0.9% FLUSH: 3.0000 mL | INTRAVENOUS | Status: DC | PRN

## 2021-04-25 MED ORDER — BIOTENE DRY MOUTH MT LIQD: 15.0000 mL | OROMUCOSAL | Status: DC | PRN

## 2021-04-25 NOTE — Patient Instructions (Signed)
sent 

## 2021-04-25 NOTE — Progress Notes (Signed)
Engineer, maintenance [facility]) Hospital Liaison note.   Received request from [place] for family interest in [facility]. [facility] is unable to offer a room today. Hospital Liaison will follow up tomorrow or sooner if a room becomes available. Spoke with spouse [name] who is primary contact. She also was given [facility] contact information.  Please do not hesitate to call with questions.   Thank you  Clementeen Hoof, RN, BSN    [place] (listed on AMION under [facility] [place] [facility] [phone]   [phone]

## 2021-04-25 NOTE — TOC Progression Note (Signed)
Transition of Care [facility]) - Progression Note    Patient Details  Name: [name] MRN: [id number] Date of Birth: 1931/07/21  Transition of Care [facility]) CM/SW Contact  Leeroy Cha, RN Phone Number: 04/25/2021, 7:24 AM  Clinical Narrative:    Spoke with Barrington Ellison on Saturday.  Did me her permission to involved as their congregational nurse and friend.  I am very willing to help [name] make these difficult decisions.  My number is [phone] personal is [phone].  I am on duty today for the church and for the [facility] department at [facility].  Please feel free to call.        Expected Discharge Plan and Services                                                 Social Determinants of Health (SDOH) Interventions    Readmission Risk Interventions No flowsheet data found.

## 2021-04-25 NOTE — TOC Initial Note (Signed)
Transition of Care [facility]) - Initial/Assessment Note    Patient Details  Name: [name] MRN: [id number] Date of Birth: Oct 28, 1930  [facility] CM/SW Contact:    Geralynn Ochs, LCSW Phone Number: 04/25/2021, 12:26 PM  Clinical Narrative:       CSW notified by Palliative NP that family has elected to pursue hospice, preference for [facility]. CSW sent referral, spoke with Bevely Palmer. [place] liaison will reach out to family to discuss. No bed available today. CSW to follow.            Expected Discharge Plan: [place] Barriers to Discharge: Continued Medical Work up, Hospice Bed not available   Patient Goals and CMS Choice Patient states their goals for this hospitalization and ongoing recovery are:: patient unable to participate in goal setting, only oriented to self CMS Medicare.gov Compare Post Acute Care list provided to:: Patient Represenative (must comment) Choice offered to / list presented to : Spouse  Expected Discharge Plan and Services Expected Discharge Plan: [place]     Post Acute Care Choice: Hospice Living arrangements for the past 2 months: Single Family Home                                      Prior Living Arrangements/Services Living arrangements for the past 2 months: Single Family Home Lives with:: Spouse Patient language and need for interpreter reviewed:: No Do you feel safe going back to the place where you live?: Yes      Need for Family Participation in Patient Care: Yes (Comment) Care giver support system in place?: No (comment)   Criminal Activity/Legal Involvement Pertinent to Current Situation/Hospitalization: No - Comment as needed  Activities of Daily Living      Permission Sought/Granted Permission sought to share information with : Facility Sport and exercise psychologist, Family Supports Permission granted to share information with : Yes, Verbal Permission Granted  Share  Information with NAME: [name]  Permission granted to share info w AGENCY: International aid/development worker granted to share info w Relationship: Wife     Emotional Assessment   Attitude/Demeanor/Rapport: Unable to Assess Affect (typically observed): Unable to Assess Orientation: : Oriented to Self Alcohol / Substance Use: Not Applicable Psych Involvement: No (comment)  Admission diagnosis:  Seizure ([place]) [R56.9] Chronic anemia [D64.9] Acute cystitis without hematuria [H70.26] Acute metabolic encephalopathy [V78.58] Stage 3b chronic kidney disease ([place]) [N18.32] Patient Active Problem List   Diagnosis Date Noted   Acute metabolic encephalopathy 85/11/7739   Fall 04/21/2021   Anemia of chronic disease 04/21/2021   AKI (acute kidney injury) ([place]) 28/78/6767   Complicated UTI (urinary tract infection) 04/21/2021    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 01/06/14 01/06/2014   Colon cancer ([place]) 12/15/2013   Acute lower GI bleeding 11/14/2013   History of pulmonary embolism 11/14/2013   LOCALIZED SUPERFICIAL SWELLING MASS OR LUMP 09/30/2010   DIVERTICULAR DISEASE 06/21/2009   ACNE ROSACEA 05/12/2009   ARTHRALGIA 05/12/2009   Gout, unspecified 05/11/2008   Essential hypertension 05/11/2008   KIDNEY DISEASE, CHRONIC NOS 05/11/2008   COUGH 04/17/2007   ESOPHAGEAL STRICTURE 01/23/2007   ADENOCARCINOMA, PROSTATE, HX OF 01/23/2007   PCP:  Haywood Pao, MD Pharmacy:   [facility] [place], [address] AT [address] [address] Phone: [phone] Fax: [phone]     Social  Determinants of Health (SDOH) Interventions    Readmission Risk Interventions No flowsheet data found.

## 2021-04-25 NOTE — Progress Notes (Signed)
Daily Progress Note   Patient Name: [name]       Date: 04/25/2021 DOB: 06-08-1931  Age: 87 y.o. MRN#: [id number] Attending Physician: Modena Jansky, MD Primary Care Physician: Haywood Pao, MD Admit Date: 04/21/2021  Reason for Consultation/Follow-up: Establishing goals of care, Hospice Evaluation, and Terminal Care.   To discuss complex medical decision making related to patient's goals of care  Patient examined. Chart Reviewed. Discussed with RN.   **Met with wife and other close friends today for a [place] meeting. Wife agrees for patient to be DNR/DNI, comfort care, and plan is for patient to move to [facility] place when bed is made available.**  Subjective: [name] wife, [name], as well as their good friend, Ronalee Belts, and their friend/case worker, Kennyth Lose, are at bedside. PMT scheduled a meeting to discuss goals of care and the option for hospice facility due to patient's worsening disposition.   After discussiong with PMT, [name] is in agreement that it would be best for [name] to go to a hospice facility. She states she wants him to be comfortable and free from stress. [name] reports she has been noticing signs that signify her husband's decline. PMT explained the different signs that she might be noticing, like lack of appetite, cold extremities, etc. PMT reassured [name] that her husband is not starving because of his lack of appetite; instead, this is the body preparing to pass peacefully. PMT discussed that feeding tube for nutrition supplement has not been found to be beneficial is patients with advanced dementia and alzheimers. Case manager, Faythe Dingwall, was there to support her friends in the decision making process. After decision to pursue a hospice facility, Code Status  was then discussed and changed to DNR/DNI. [name] states [name] would not want to be aggressively resuscitated, intubated, or have a feeding tube. All questions were answered and concerns were addressed.   Assessment: Patient with significant alzheimer's and acute metabolic encephalopathy post fall, with multiple bilateral strokes, is awake and pleasantly confused. Patient appears comfortable in no sign of acute distress. Significant anorexia, noticeable continuous myoclonus, and cool extremities. No noticeable work of breathing.    Patient Profile/HPI: 87 y.o. male  with past medical history of prostate cancer, DVT/PE, PVD, CKD, colon cancer, Alzheimer's dementia, chronic foley with recurrent UTIs, PAD who was  admitted on 04/21/2021 after being found on the floor by his wife.   Imaging revealed multiple small strokes and a small subarachnoid hemorrhage.  He has been suffering with a continuous jerking thru his abdomen and down his leg.   He appears to have no interest in eating or drinking despite the Nursing staff trying to feed him.     Length of Stay: 4   Vital Signs: BP (!) 148/81 (BP Location: Right Arm)   Pulse 83   Temp 98.9 F (37.2 C) (Oral)   Resp 16   Ht '5\' 10"'  (1.778 m)   Wt 77.1 kg   SpO2 96%   BMI 24.39 kg/m  SpO2: SpO2: 96 % O2 Device: O2 Device: Room Air O2 Flow Rate:         Palliative Assessment/Data: 20% PPS      Palliative Care Plan    Recommendations/Plan: Patient is now DNR/DNI  Full comfort measures  Plan for discharge to hospice facility. Awaiting bed at [place] symptom management and keep patient comfortable until bed becomes available.  TOC consulted   Code Status:  DNR  Prognosis:  < 2 weeks - not taking PO, comfort measures only, multiple bilateral strokes, myoclonus, pleasantly demented.  Discharge Planning: [place] bed at [place] was discussed with Patient's Wife [name]), Denman George  Ronalee Belts), and case worker [name]).  Thank you for allowing the Palliative Medicine Team to assist in the care of this patient.  Total time spent: 65 mins  Time in:  11:00 Time out 12:05     Greater than 50%  of this time was spent counseling and coordinating care related to the above assessment and plan.  Florentina Jenny, PA-C Palliative Medicine Demetrios Isaacs, PA-S2  Please contact Palliative MedicineTeam phone at [phone] for questions and concerns between 7 am - 7 pm.   Please see AMION for individual provider pager numbers.

## 2021-04-25 NOTE — Congregational Nurse Program (Signed)
[id number] with  [place] palliative care.  Family meeting this am at [place], wife [name] has requested that I be present.  Went to [employer] to the room met wife and Haynes Dage there.  Very pleasant conversation about the end of life for her husband [name] who has suffered multiple mini strove and bleed of the brain after falling at home.  [name] states that [name] would not want to be kept artificially alive and agrees with plan for him to be transferred to [facility] place when a bed becomes open.  May take several days.  Seith will be moved to [place] so that the friends and family may visit.  [name] was at peace with the over decisions and explanations.  [employer] has been made a DNR. And will be allow to have comfort measures.

## 2021-04-25 NOTE — Progress Notes (Signed)
SLP Cancellation Note  Patient Details Name: [name] MRN: [id number] DOB: 1931-07-16   Cancelled treatment:       Reason Eval/Treat Not Completed: Patient at procedure or test/unavailable; attempted to speak with wife re: baseline cognitive function, but she was unavailable during attempts.  ST will continue to f/u in acute setting to determine baseline cognitive functioning.   Elvina Sidle, M.S., [place] 04/25/2021, 1:37 PM

## 2021-04-25 NOTE — Plan of Care (Signed)
  Problem: Pain Managment: Goal: General experience of comfort will improve Outcome: Progressing   Problem: Safety: Goal: Ability to remain free from injury will improve Outcome: Progressing   Problem: Skin Integrity: Goal: Risk for impaired skin integrity will decrease Outcome: Progressing   

## 2021-04-25 NOTE — Progress Notes (Addendum)
STROKE TEAM PROGRESS NOTE   INTERVAL HISTORY His RN is at bedside. He is much more alert and interactive today, pleasantly confused, but still with poor po. Intermittent focal seizure noted in left leg. Will increase daily keppra to 1000mg  BID and give extra 500mg  now. PC meeting planned for this afternoon for GOC Vitals:   04/24/21 2319 04/25/21 0357 04/25/21 0809 04/25/21 1147  BP: (!) 146/85 (!) 142/93 (!) 128/96 (!) 148/81  Pulse: 96 91 85 83  Resp: 20 18 18 16   Temp: 98.8 F (37.1 C) 98.7 F (37.1 C) 98.6 F (37 C) 98.9 F (37.2 C)  TempSrc: Oral  Axillary Oral  SpO2: 98% 100% 100% 96%  Weight:      Height:       CBC:  Recent Labs  Lab 04/21/21 1050 04/22/21 0308 04/23/21 0356  WBC 8.2 7.6 7.6  NEUTROABS 6.8  --   --   HGB 9.8* 9.7* 9.8*  HCT 29.9* 29.5* 30.4*  MCV 92.3 91.3 91.8  PLT 200 192 694    Basic Metabolic Panel:  Recent Labs  Lab 04/23/21 0356 04/24/21 0341  NA 139 139  K 3.4* 3.5  CL 105 106  CO2 23 22  GLUCOSE 85 81  BUN 27* 26*  CREATININE 1.97* 2.07*  CALCIUM 9.6 9.5    Lipid Panel:  Recent Labs  Lab 04/23/21 0356  CHOL 104  TRIG 60  HDL 41  CHOLHDL 2.5  VLDL 12  LDLCALC 51    HgbA1c:  Recent Labs  Lab 04/23/21 0356  HGBA1C 5.7*    Urine Drug Screen: No results for input(s): LABOPIA, COCAINSCRNUR, LABBENZ, AMPHETMU, THCU, LABBARB in the last 168 hours.  Alcohol Level No results for input(s): ETH in the last 168 hours. EEG impression from 04/21/2021: "This study is suggestive of mild to moderate diffuse encephalopathy, nonspecific etiology.  Two episodes were noted as described above without concomitant EEG change and were not epileptic.  No seizures or epileptiform discharges were seen throughout the recording." IMAGING past 24 hours No results found.  PHYSICAL EXAM General: Appears frail, elderly. No acute distress Psych: Affect appropriate to situation Eyes: No scleral injection HENT: No OP obstrucion Head:  Normocephalic.  Cardiovascular: Normal rate and regular rhythm.  Respiratory: Effort normal and breath sounds normal to anterior ascultation GI: Soft.  No distension. There is no tenderness.  Skin: WDI    Neurological Examination Mental Status: Somnolent, but wakes to voice. Mumbles name only. Speech is sparse, but seems fluent, slightly dysarthric. Follows simple commands, but unable to preform complex commands Cranial Nerves: PERRL, 13mm. EOMI. VFF. Face is symmetric, denies sensation change. Very HOH. Motor:Tone and bulk:normal tone throughout for age. Some gen weakness noted throughout, but LUE with drift. Grips feel equal, but limited as he poorly follows commands. Sensory:  light touch intact throughout, bilaterally Deep Tendon Reflexes: 2+ and symmetric throughout Plantars: Right: downgoing   Left: downgoing Cerebellar:unable; no gross ataxia Gait: defered  ASSESSMENT/PLAN [name] is a 87 y.o. male with history of dementia presenting after being found down with seizure-like activity. He received Ativan and Keppra load, now on keppra 500mg  BID and no further seizure activity reported. Then after missed doses of keppra (d/t refusing po) LLE focal seizure-like activity returned on 7/3. Loaded again with IV keppra and switched malignance doses.  MRI was done as part of seizure work up and scattered strokes found in multiple vascular territories and at least two small areas of hemorrhage, which  raises concern for CAA using the [place] Criteria given his dementia at baseline.   Stroke: Multiple cardioembolic appearing strokes. Etiology undetermined at this time. May need halter for longer term monitoring, pending GOC given baseline debility and dementia, would not opt for loop recorder.  Code Stroke CT head No acute abnormality. Small vessel disease. Atrophy. Left temporal encephalomalacia MRI  Small volume hemorrhage in left frontal, but upon review there may be other  microhemmorhages, but difficult to tell given poor quality of MR which is degraded by movement. Punctate foci of acute ischemia within the right cerebellar hemisphere, right temporal lobe and medial left frontal lobe. MRA  neg Carotid Doppler  neg; no stenosis 2D Echo 55% EF, Aortic dilation and calcification noted LDL 51 HgbA1c 5.7 VTE prophylaxis - scds; holding chemoppx d/t some hemorrhage noted on MRI    Diet   Diet Heart Room service appropriate? Yes; Fluid consistency: Thin   aspirin 81 mg daily prior to admission, now on  Holding d/t bleed .  Therapy recommendations:  pending GOC discussion Disposition:  pending  Hypertension Home meds:  Coreg, hydrodiuril Stable Permissive hypertension (OK if < 220/120) but gradually normalize in 5-7 days Long-term BP goal normotensive  Hyperlipidemia Home meds:  Fish oil LDL 51, goal < 70 High intensity statin not indicated Continue statin at discharge  Diabetes type II - no dx Home meds:  none HgbA1c 5.7, goal < 7.0 CBGs No results for input(s): GLUCAP in the last 72 hours.   SSI  Other Stroke Risk Factors Advanced Age >/= 46  Hx stroke/TIA Family hx stroke   Other Active Problems Seizures- increase keppra 1000mg  BID, keep IV for now d/t poor po intake. Will give extra 500mg  now.  H/o prostate ca- Chronic foley catheter with freq UTIs AKI on DKD3- monitoring Both hypo and hyperactive delirium w/agitation- Continue Seroquel QHS and PRN Encephalopathy- multi-factorial  Vascular Dementia with what appears to be FTT; refusing meds & food.  Vit B12 deficiency-supplement Goals of Care are needed. Agree with Palliative Care consult and family meeting today noted.  Hospital day # 4  Desiree Metzger-Cihelka, ARNP-C, ANVP-BC Pager: [phone]  I have personally obtained history,examined this patient, reviewed notes, independently viewed imaging studies, participated in medical decision making and plan of care.ROS completed by  me personally and pertinent positives fully documented  I have made any additions or clarifications directly to the above note. Agree with note above.  Patient is having focal left body seizures during exam.  Agree with increasing dose of Keppra to 1 g twice daily.  Palliative care team is consulting and family is leaning towards comfort care.  Discussed with Dr. Algis Liming.  Greater than 50% time during this 25-minute visit was spent on counseling and coordination of care and discussion with care team.  Stroke team will sign off.  [name] Contras, MD Medical Director [facility] Pager: [phone] 04/25/2021 1:43 PM  To contact Stroke Continuity provider, please refer to [email or web address] After hours, contact General Neurology

## 2021-04-25 NOTE — Progress Notes (Signed)
PROGRESS NOTE   [name]  DJT:[id number]    DOB: 08-07-1931    DOA: 04/21/2021  PCP: Haywood Pao, MD   I have briefly reviewed patients previous medical records in [facility].  Chief Complaint  Patient presents with   Weakness   Altered Mental Status    Brief Narrative:  87 year old male with medical history of hypertension, hyperlipidemia, DVT/PE, CKD stage IIIb, Alzheimer's dementia, recurrent prostate cancer, bladder tumor s/p TURBT, urinary retention with chronic Foley, PAD, presented to the [facility] ED after an unwitnessed fall at home.  Wife found patient on the floor and could not get him up.  911 was called.  In the ED patient noted to have seizure-like activity/muscle spasm in left leg, received 1.5 mg of Ativan and loaded with 2 g of IV Keppra and transferred to [facility] ED for evaluation by neurology.  Admitted for seizure-like activity.  Further work-up revealed multiple cardioembolic appearing strokes and small subarachnoid hemorrhage.  Stroke team following.  Patient refusing to eat or take meds.  Overall poor prognosis and failure to thrive.  Consulted palliative medicine and family opted to transition to full comfort care 7/4 with plans for admission to residential hospice.   Assessment & Plan:  Principal Problem:   Acute metabolic encephalopathy Active Problems:   Essential hypertension   ADENOCARCINOMA, PROSTATE, HX OF   History of pulmonary embolism   Fall   Anemia of chronic disease   AKI (acute kidney injury) ([place])   Complicated UTI (urinary tract infection)   Seizure-like activity: Initially felt to be polymyoclonus.  However further work-up has now revealed multiple cardioembolic appearing strokes and a small subarachnoid hemorrhage.  As per neurology, high right frontal stroke can possibly explain the left leg jerking movements which are likely more of weakness as well as irritation from the surrounding small SAH. CT head 6/30 without acute findings.   MRI brain 7/1: Small volume SAH.  Punctate foci of acute ischemia within the right cerebellar hemisphere, right temporal lobe and medial left frontal lobe.  MRI C-spine: No high-grade spinal canal stenosis.  Patient had been on Keppra for same.  However on 7/4, family met with palliative care team and patient was transitioned to full comfort care and DC to residential hospice.  Could consider changing Keppra to Ativan as needed for seizure-like activity.  Acute ischemic strokes: MRI brain as noted above.  MRA head: No aneurysm or intracranial artery occlusion.  CUS: Bilateral 1 to 39% ICA stenosis.  Bilateral vertebral arteries with antegrade flow. Echo: LVEF 30-09%, grade 1 diastolic dysfunction. A1C: 5.7.  LDL 51.  No antiplatelets due to [facility].  Stroke team followed.  Now full comfort care.  Small SAH: Follow-up CT head showed small amount of SAH without mass-effect.  MRA head without aneurysm.  Now full comfort care  Acute metabolic encephalopathy: Likely multifactorial related to multiple acute strokes, small SAH, med/Ativan in ED, UTI, complicating underlying dementia.  TSH normal.  Gave IM B12 supplementation.  Thiamine level pending.  Was on Keppra for seizure-like activity. EEG consistent with mild to moderate diffuse encephalopathy.  Remains pleasantly confused. Overall poor prognosis and failure to thrive.  Consulted palliative medicine and family opted to transition to full comfort care 7/4 with plans for admission to residential hospice.  B12 deficiency: B12 level: 131.  Received parenteral B12 supplements prior to transitioning to full comfort care.  Catheter associated Citrobacter UTI/complicated UTI/recurrent UTI: Changed Foley catheter 7/1.  Blood cultures x2: Negative to  date.  Urine culture: >100 K colonies of Citrobacter-resistant to cefazolin, Bactrim and intermediate to nitrofurantoin.  As per ID pharmacy recommendation, changed to IV cefepime to complete total 7 days treatment.  Since  comfort care, oral antibiotics and medications nonessential to comfort can be discontinued.  Acute kidney injury complicating stage IIIb CKD: Baseline creatinine not clearly known but may be in the one-point 8-2 range.  Presented with creatinine of 2.07.  Improved to 1.95 >1.97 >2.27.  Creatinine has plateaued, likely at baseline now.  Could DC IVF since comfort care.  Fall at home: Details not known.  Could be related to involuntary movement of his left lower extremity.  Lack of adequate history.  In the ED reportedly noted to have sinus pause and ventricular bigeminy on EKG.  Telemetry shows sinus rhythm with frequent PACs.  D-dimer elevated.  VQ scan negative.  DC telemetry now that full comfort care.  Hypokalemia: Replaced.  Anemia of chronic disease: Stable  Alzheimer's dementia: No behavioral abnormalities currently.  Essential hypertension: Holding HCTZ due to AKI.  Could DC carvedilol.  History of DVT/PE: Reportedly provoked after surgery.  VQ scan negative this admission.  History of prostate cancer: Follows with Dr. Gloriann Loan, urology.  Mild dilatation of ascending aorta: 39 mm on echo.    Body mass index is 24.39 kg/m.   DVT prophylaxis: Place and maintain sequential compression device Start: 04/22/21 2018     Code Status: DNR Family Communication: I discussed in detail with patient's spouse in detail on 7/2, updated care and answered all questions.  Disposition:  Status is: Inpatient  Remains inpatient appropriate because:Inpatient level of care appropriate due to severity of illness  Dispo: The patient is from: Home              Anticipated d/c is to: SNF              Patient currently is not medically stable to d/c.   Difficult to place patient No        Consultants:   Neurology Palliative care medicine  Procedures:   Change of Foley catheter 7/1  Antimicrobials:    Anti-infectives (From admission, onward)    Start     Dose/Rate Route Frequency Ordered  Stop   04/24/21 0830  ceFEPIme (MAXIPIME) 1 g in sodium chloride 0.9 % 100 mL IVPB        1 g 200 mL/hr over 30 Minutes Intravenous Every 24 hours 04/24/21 0733 04/28/21 0829   04/21/21 2300  piperacillin-tazobactam (ZOSYN) IVPB 3.375 g  Status:  Discontinued        3.375 g 12.5 mL/hr over 240 Minutes Intravenous Every 8 hours 04/21/21 1434 04/24/21 0733   04/21/21 1500  piperacillin-tazobactam (ZOSYN) IVPB 3.375 g        3.375 g 100 mL/hr over 30 Minutes Intravenous  Once 04/21/21 1434 04/21/21 1641   04/21/21 1115  cefTRIAXone (ROCEPHIN) 1 g in sodium chloride 0.9 % 100 mL IVPB        1 g 200 mL/hr over 30 Minutes Intravenous  Once 04/21/21 1106 04/21/21 1242         Subjective:  Waxing and waning mental status.  When I saw him this morning, quite drowsy/sleepy, briefly arousable, opened his eyes and said "I am okay" and then drifted back to sleep.  Ongoing involuntary activity of left side of body including left lower extremity and neck.  Objective:   Vitals:   04/24/21 2319 04/25/21 0357 04/25/21 0809 04/25/21 1147  BP: (!) 146/85 (!) 142/93 (!) 128/96 (!) 148/81  Pulse: 96 91 85 83  Resp: _0 Temp: 98.8 F (37.1 C) 98.7 F (37.1 C) 98.6 F (37 C) 98.9 F (37.2 C)  TempSrc: Oral  Axillary Oral  SpO2: 98% 100% 100% 96%  Weight:      Height:        General exam: Early male, moderately built, frail and chronically ill looking lying comfortably supine in bed.   Respiratory system: Clear to auscultation.  No increased work of breathing. Cardiovascular system: S1 and S2 heard, RRR.  No JVD, murmurs or pedal edema.  Telemetry: Sinus rhythm with frequent PACs. Gastrointestinal system: Abdomen is nondistended, soft and nontender. No organomegaly or masses felt. Normal bowel sounds heard. Central nervous system: Mental status as noted above.  No cranial nerve deficits. Extremities: Symmetric 5 x 5 power.  Ongoing left lower extremity involuntary movements and left side  of neck but better compared to yesterday. Skin: No rashes, lesions or ulcers Psychiatry: Judgement and insight impaired. Mood & affect cannot be assessed. GU: Foley catheter in place with thigh bag.    Data Reviewed:   I have personally reviewed following labs and imaging studies   CBC: Recent Labs  Lab 04/21/21 1050 04/22/21 0308 04/23/21 0356  WBC 8.2 7.6 7.6  NEUTROABS 6.8  --   --   HGB 9.8* 9.7* 9.8*  HCT 29.9* 29.5* 30.4*  MCV 92.3 91.3 91.8  PLT 200 192 938    Basic Metabolic Panel: Recent Labs  Lab 04/22/21 0308 04/23/21 0356 04/24/21 0341  NA 141 139 139  K 3.5 3.4* 3.5  CL 109 105 106  CO2 _1 GLUCOSE 81 85 81  BUN 30* 27* 26*  CREATININE 1.95* 1.97* 2.07*  CALCIUM 9.4 9.6 9.5    Liver Function Tests: Recent Labs  Lab 04/21/21 1050  AST 16  ALT 12  ALKPHOS 73  BILITOT 0.6  PROT 6.5  ALBUMIN 3.5    CBG: Recent Labs  Lab 04/21/21 1037  GLUCAP 81    Microbiology Studies:   Recent Results (from the past 240 hour(s))  Urine culture     Status: Abnormal   Collection Time: 04/21/21 10:40 AM   Specimen: Urine, Clean Catch  Result Value Ref Range Status   Specimen Description   Final    URINE, CLEAN CATCH Performed at [facility], [place] [address], [place], [place] [address]    Special Requests   Final    NONE Performed at [facility], [place] [address] [place], [place] [address]    Culture (A)  Final    >=100,000 COLONIES/mL CITROBACTER FREUNDII >=100,000 COLONIES/mL AEROCOCCUS SPECIES Standardized susceptibility testing for this organism is not available. Performed at [facility], [place] [address], [place], [place] [address]    Report Status 04/24/2021 FINAL  Final   Organism ID, Bacteria CITROBACTER FREUNDII (A)  Final      Susceptibility   Citrobacter freundii - MIC*    CEFAZOLIN >=64 RESISTANT Resistant     CEFEPIME <=0.12 SENSITIVE Sensitive     CEFTRIAXONE 0.5 SENSITIVE  Sensitive     CIPROFLOXACIN 1 SENSITIVE Sensitive     GENTAMICIN <=1 SENSITIVE Sensitive     IMIPENEM 0.5 SENSITIVE Sensitive     NITROFURANTOIN 64 INTERMEDIATE Intermediate     TRIMETH/SULFA >=320 RESISTANT Resistant     PIP/TAZO <=4 SENSITIVE Sensitive     * >=100,000 COLONIES/mL  CITROBACTER FREUNDII  Culture, blood (routine x 2)     Status: None (Preliminary result)   Collection Time: 04/21/21 11:52 AM   Specimen: BLOOD  Result Value Ref Range Status   Specimen Description   Final    BLOOD RIGHT ANTECUBITAL Performed at [place] [address], [place], [place] [address]    Special Requests   Final    BOTTLES DRAWN AEROBIC AND ANAEROBIC Blood Culture adequate volume Performed at [place] [address], [place], [place] [address]    Culture   Final    NO GROWTH 4 DAYS Performed at [facility], [place] [address] [place], [place] [address]    Report Status PENDING  Incomplete  Culture, blood (routine x 2)     Status: None (Preliminary result)   Collection Time: 04/21/21 11:52 AM   Specimen: BLOOD  Result Value Ref Range Status   Specimen Description   Final    BLOOD LEFT ANTECUBITAL Performed at [place] [address], [place], [place] [address]    Special Requests   Final    BOTTLES DRAWN AEROBIC AND ANAEROBIC Blood Culture adequate volume Performed at [place] [address], [place], [place] [address]    Culture   Final    NO GROWTH 4 DAYS Performed at [facility], [place] [address] [place], [place] [address]    Report Status PENDING  Incomplete  Resp Panel by RT-PCR (Flu A&B, Covid) Nasopharyngeal Swab     Status: None   Collection Time: 04/21/21 11:58 AM   Specimen: Nasopharyngeal Swab; Nasopharyngeal(NP) swabs in vial transport medium  Result Value Ref Range Status   SARS Coronavirus 2 by RT PCR NEGATIVE NEGATIVE Final    Comment: (NOTE) SARS-CoV-2 target  nucleic acids are NOT DETECTED.  The SARS-CoV-2 RNA is generally detectable in upper respiratory specimens during the acute phase of infection. The lowest concentration of SARS-CoV-2 viral copies this assay can detect is 138 copies/mL. A negative result does not preclude SARS-Cov-2 infection and should not be used as the sole basis for treatment or other patient management decisions. A negative result may occur with  improper specimen collection/handling, submission of specimen other than nasopharyngeal swab, presence of viral mutation(s) within the areas targeted by this assay, and inadequate number of viral copies(<138 copies/mL). A negative result must be combined with clinical observations, patient history, and epidemiological information. The expected result is Negative.  Fact Sheet for Patients:  EntrepreneurPulse.com.au  Fact Sheet for Healthcare Providers:  IncredibleEmployment.be  This test is no t yet approved or cleared by the [place] FDA and  has been authorized for detection and/or diagnosis of SARS-CoV-2 by FDA under an Emergency Use Authorization (EUA). This EUA will remain  in effect (meaning this test can be used) for the duration of the COVID-19 declaration under Section 564(b)(1) of the Act, 21 U.S.C.section 360bbb-3(b)(1), unless the authorization is terminated  or revoked sooner.       Influenza A by PCR NEGATIVE NEGATIVE Final   Influenza B by PCR NEGATIVE NEGATIVE Final    Comment: (NOTE) The Xpert Xpress SARS-CoV-2/FLU/RSV plus assay is intended as an aid in the diagnosis of influenza from Nasopharyngeal swab specimens and should not be used as a sole basis for treatment. Nasal washings and aspirates are unacceptable for Xpert Xpress SARS-CoV-2/FLU/RSV testing.  Fact Sheet for Patients: EntrepreneurPulse.com.au  Fact Sheet for Healthcare  Providers: IncredibleEmployment.be  This test is not  yet approved or cleared by the [place] and has been authorized for detection and/or diagnosis of SARS-CoV-2 by FDA under an Emergency Use Authorization (EUA). This EUA will remain in effect (meaning this test can be used) for the duration of the COVID-19 declaration under Section 564(b)(1) of the Act, 21 U.S.C. section 360bbb-3(b)(1), unless the authorization is terminated or revoked.  Performed at [facility], [place] [address] [place], [place] [address]      Radiology Studies:  ECHOCARDIOGRAM COMPLETE  Result Date: 04/24/2021    ECHOCARDIOGRAM REPORT   Patient Name:   [name] Date of Exam: 04/24/2021 Medical Rec #:  [id number]        Height:       70.0 in Accession #:    [id number]       Weight:       170.0 lb Date of Birth:  [birth year]       BSA:          1.948 m Patient Age:    55 years         BP:           152/100 mmHg Patient Gender: M                HR:           85 bpm. Exam Location:  Inpatient Procedure: 2D Echo, Cardiac Doppler and Color Doppler Indications:    [place]  History:        Patient has no prior history of Echocardiogram examinations.                 Risk Factors:Hypertension.  Sonographer:    Cammy Brochure Referring Phys: [id number] [facility]  Sonographer Comments: Image acquisition challenging due to uncooperative patient. IMPRESSIONS  1. Calcified aortic valve with reduced excursion of left coronary cusp; no significant AS by doppler.  2. Left ventricular ejection fraction, by estimation, is 55 to 60%. The left ventricle has normal function. The left ventricle has no regional wall motion abnormalities. There is mild left ventricular hypertrophy. Left ventricular diastolic parameters are consistent with Grade I diastolic dysfunction (impaired relaxation).  3. Right ventricular systolic function is normal. The right ventricular size is normal.  4. The mitral  valve is normal in structure. Trivial mitral valve regurgitation. No evidence of mitral stenosis.  5. The aortic valve is calcified. Aortic valve regurgitation is mild. No aortic stenosis is present.  6. Aortic dilatation noted. There is mild dilatation of the ascending aorta, measuring 39 mm.  7. The inferior vena cava is normal in size with greater than 50% respiratory variability, suggesting right atrial pressure of 3 mmHg. FINDINGS  Left Ventricle: Left ventricular ejection fraction, by estimation, is 55 to 60%. The left ventricle has normal function. The left ventricle has no regional wall motion abnormalities. The left ventricular internal cavity size was normal in size. There is  mild left ventricular hypertrophy. Left ventricular diastolic parameters are consistent with Grade I diastolic dysfunction (impaired relaxation). Right Ventricle: The right ventricular size is normal.Right ventricular systolic function is normal. Left Atrium: Left atrial size was normal in size. Right Atrium: Right atrial size was normal in size. Pericardium: There is no evidence of pericardial effusion. Mitral Valve: The mitral valve is normal in structure. Trivial mitral valve regurgitation. No evidence of mitral valve stenosis. Tricuspid Valve: The tricuspid valve is normal in structure. Tricuspid valve regurgitation is not demonstrated. No evidence of tricuspid stenosis. Aortic Valve:  The aortic valve is calcified. Aortic valve regurgitation is mild. No aortic stenosis is present. Aortic valve mean gradient measures 8.5 mmHg. Aortic valve peak gradient measures 16.0 mmHg. Pulmonic Valve: The pulmonic valve was not well visualized. Pulmonic valve regurgitation is not visualized. No evidence of pulmonic stenosis. Aorta: Aortic dilatation noted. There is mild dilatation of the ascending aorta, measuring 39 mm. Venous: The inferior vena cava is normal in size with greater than 50% respiratory variability, suggesting right atrial  pressure of 3 mmHg. IAS/Shunts: The interatrial septum is aneurysmal. No atrial level shunt detected by color flow Doppler. Additional Comments: Calcified aortic valve with reduced excursion of left coronary cusp; no significant AS by doppler.  LEFT VENTRICLE PLAX 2D LVIDd:         4.50 cm  Diastology LVIDs:         3.50 cm  LV e' medial:    5.98 cm/s LV PW:         1.30 cm  LV E/e' medial:  7.5 LV IVS:        1.30 cm  LV e' lateral:   6.64 cm/s LVOT diam:     2.20 cm  LV E/e' lateral: 6.8 LV SV:         77 LV SV Index:   40 LVOT Area:     3.80 cm  RIGHT VENTRICLE RV S prime:     16.30 cm/s TAPSE (M-mode): 1.8 cm LEFT ATRIUM             Index LA diam:        3.40 cm 1.75 cm/m LA Vol (A2C):   67.3 ml 34.55 ml/m LA Vol (A4C):   48.9 ml 25.10 ml/m LA Biplane Vol: 59.1 ml 30.34 ml/m  AORTIC VALVE AV Area (Vmax):    1.77 cm AV Area (Vmean):   1.73 cm AV Area (VTI):     1.75 cm AV Vmax:           200.00 cm/s AV Vmean:          131.500 cm/s AV VTI:            0.440 m AV Peak Grad:      16.0 mmHg AV Mean Grad:      8.5 mmHg LVOT Vmax:         93.30 cm/s LVOT Vmean:        59.700 cm/s LVOT VTI:          0.203 m LVOT/AV VTI ratio: 0.46  AORTA Ao Root diam: 3.70 cm Ao Asc diam:  3.90 cm MITRAL VALVE MV Area (PHT): 2.76 cm    SHUNTS MV Decel Time: 275 msec    Systemic VTI:  0.20 m MV E velocity: 45.10 cm/s  Systemic Diam: 2.20 cm MV A velocity: 78.60 cm/s MV E/A ratio:  0.57 Kirk Ruths MD Electronically signed by Kirk Ruths MD Signature Date/Time: 04/24/2021/11:58:40 AM    Final      Scheduled Meds:    allopurinol  300 mg Oral QPM   carvedilol  6.25 mg Oral BID WC   Chlorhexidine Gluconate Cloth  6 each Topical Q0600   cyanocobalamin  1,000 mcg Intramuscular Daily   sodium chloride flush  3 mL Intravenous Q12H    Continuous Infusions:    0.9 % NaCl with KCl 20 mEq / L 50 mL/hr at 04/25/21 0806   ceFEPime (MAXIPIME) IV 1 g (04/25/21 1059)   levETIRAcetam     levETIRAcetam  LOS: 4 days      Vernell Leep, MD, [place], [facility]. [facility]    To contact the attending provider between 7A-7P or the covering provider during after hours 7P-7A, please log into the web site [email or web address] and access using universal [place] password for that web site. If you do not have the password, please call the hospital operator.  04/25/2021, 12:47 PM

## 2021-04-26 DIAGNOSIS — Z515 Encounter for palliative care: Secondary | ICD-10-CM

## 2021-04-26 DIAGNOSIS — R569 Unspecified convulsions: Secondary | ICD-10-CM

## 2021-04-26 LAB — CULTURE, BLOOD (ROUTINE X 2)
Culture: NO GROWTH
Culture: NO GROWTH
Special Requests: ADEQUATE
Special Requests: ADEQUATE

## 2021-04-26 LAB — VITAMIN B1: Vitamin B1 (Thiamine): 88.9 nmol/L (ref 66.5–200.0)

## 2021-04-26 NOTE — Progress Notes (Signed)
PROGRESS NOTE   [name]  KGY:[id number]    DOB: 08/31/1931    DOA: 04/21/2021  PCP: Haywood Pao, MD   I have briefly reviewed patients previous medical records in [facility].  Chief Complaint  Patient presents with   Weakness   Altered Mental Status    Brief Narrative:  87 year old male with medical history of hypertension, hyperlipidemia, DVT/PE, CKD stage IIIb, Alzheimer's dementia, recurrent prostate cancer, bladder tumor s/p TURBT, urinary retention with chronic Foley, PAD, presented to the [facility] ED after an unwitnessed fall at home.  Wife found patient on the floor and could not get him up.  911 was called.  In the ED patient noted to have seizure-like activity/muscle spasm in left leg, received 1.5 mg of Ativan and loaded with 2 g of IV Keppra and transferred to [facility] ED for evaluation by neurology.  Admitted for seizure-like activity.  Further work-up revealed multiple cardioembolic appearing strokes and small subarachnoid hemorrhage.  Stroke team following.  Patient refusing to eat or take meds.  Overall poor prognosis and failure to thrive.  Consulted palliative medicine and family opted to transition to full comfort care 7/4 with plans for admission to residential hospice.  Medically ready for DC to [facility] when bed available.  No beds available today.   Assessment & Plan:  Principal Problem:   Acute metabolic encephalopathy Active Problems:   Essential hypertension   ADENOCARCINOMA, PROSTATE, HX OF   History of pulmonary embolism   Fall   Anemia of chronic disease   AKI (acute kidney injury) ([place])   Complicated UTI (urinary tract infection)   Seizure ([place])   End of life care   Seizure-like activity: Initially felt to be polymyoclonus.  However further work-up has now revealed multiple cardioembolic appearing strokes and a small subarachnoid hemorrhage.  As per neurology, high right frontal stroke can possibly explain the left leg jerking movements  which are likely more of weakness as well as irritation from the surrounding small SAH. CT head 6/30 without acute findings.  MRI brain 7/1: Small volume SAH.  Punctate foci of acute ischemia within the right cerebellar hemisphere, right temporal lobe and medial left frontal lobe.  MRI C-spine: No high-grade spinal canal stenosis.  Patient had been on Keppra for same.  However on 7/4, family met with palliative care team and patient was transitioned to full comfort care and DC to residential hospice.  Could consider changing Keppra to Ativan as needed for seizure-like activity.  Remains on Keppra which can continue while hospitalized and stop at time of discharge to residential hospice.  Acute ischemic strokes: MRI brain as noted above.  MRA head: No aneurysm or intracranial artery occlusion.  CUS: Bilateral 1 to 39% ICA stenosis.  Bilateral vertebral arteries with antegrade flow. Echo: LVEF 02-63%, grade 1 diastolic dysfunction. A1C: 5.7.  LDL 51.  No antiplatelets due to [facility].  Stroke team followed.  Now full comfort care.  Small SAH: Follow-up CT head showed small amount of SAH without mass-effect.  MRA head without aneurysm.  Now full comfort care  Acute metabolic encephalopathy: Likely multifactorial related to multiple acute strokes, small SAH, med/Ativan in ED, UTI, complicating underlying dementia.  TSH normal.  Gave IM B12 supplementation.  Thiamine level pending.  Was on Keppra for seizure-like activity. EEG consistent with mild to moderate diffuse encephalopathy.  Remains pleasantly confused. Overall poor prognosis and failure to thrive.  Consulted palliative medicine and family opted to transition to full comfort  care 7/4 with plans for admission to residential hospice.  B12 deficiency: B12 level: 131.  Received parenteral B12 supplements prior to transitioning to full comfort care.  Catheter associated Citrobacter UTI/complicated UTI/recurrent UTI: Changed Foley catheter 7/1.  Blood cultures  x2: Negative to date.  Urine culture: >100 K colonies of Citrobacter-resistant to cefazolin, Bactrim and intermediate to nitrofurantoin.  As per ID pharmacy recommendation, changed to IV cefepime to complete total 7 days treatment.  Since comfort care, oral antibiotics and medications nonessential to comfort were stopped.  Acute kidney injury complicating stage IIIb CKD: Baseline creatinine not clearly known but may be in the one-point 8-2 range.  Presented with creatinine of 2.07.  Improved to 1.95 >1.97 >2.27.  Creatinine has plateaued, likely at baseline now.  IVF stopped.  Fall at home: Details not known.  Could be related to involuntary movement of his left lower extremity.  Lack of adequate history.  In the ED reportedly noted to have sinus pause and ventricular bigeminy on EKG.  Telemetry shows sinus rhythm with frequent PACs.  D-dimer elevated.  VQ scan negative.   Hypokalemia: Replaced.  Anemia of chronic disease: Stable  Alzheimer's dementia: No behavioral abnormalities currently.  Essential hypertension: Discontinued meds.  History of DVT/PE: Reportedly provoked after surgery.  VQ scan negative this admission.  History of prostate cancer: Follows with Dr. Gloriann Loan, urology.  Mild dilatation of ascending aorta: 39 mm on echo.    Body mass index is 24.39 kg/m.   DVT prophylaxis: Place and maintain sequential compression device Start: 04/22/21 2018     Code Status: DNR Family Communication: None at bedside today. Disposition:  Status is: Inpatient  Remains inpatient appropriate because:Inpatient level of care appropriate due to severity of illness  Dispo: The patient is from: Home              Anticipated d/c is to: [facility]              Patient currently is medically ready for DC.   Difficult to place patient No        Consultants:   Neurology Palliative care medicine  Procedures:   Change of Foley catheter 7/1  Antimicrobials:    Anti-infectives (From  admission, onward)    Start     Dose/Rate Route Frequency Ordered Stop   04/24/21 0830  ceFEPIme (MAXIPIME) 1 g in sodium chloride 0.9 % 100 mL IVPB  Status:  Discontinued        1 g 200 mL/hr over 30 Minutes Intravenous Every 24 hours 04/24/21 0733 04/25/21 1606   04/21/21 2300  piperacillin-tazobactam (ZOSYN) IVPB 3.375 g  Status:  Discontinued        3.375 g 12.5 mL/hr over 240 Minutes Intravenous Every 8 hours 04/21/21 1434 04/24/21 0733   04/21/21 1500  piperacillin-tazobactam (ZOSYN) IVPB 3.375 g        3.375 g 100 mL/hr over 30 Minutes Intravenous  Once 04/21/21 1434 04/21/21 1641   04/21/21 1115  cefTRIAXone (ROCEPHIN) 1 g in sodium chloride 0.9 % 100 mL IVPB        1 g 200 mL/hr over 30 Minutes Intravenous  Once 04/21/21 1106 04/21/21 1242         Subjective:  Somnolent, arousable, states that he is "okay" and drifts back to sleep.  Mild intermittent left-sided jerking, mostly leg.  Objective:   Vitals:   04/25/21 1147 04/25/21 1529 04/25/21 2332 04/26/21 0733  BP: (!) 148/81 (!) 148/83 (!) 153/103 (!) 144/91  Pulse: 83 91 (!) 102 79  Resp: '16 16 17 18  ' Temp: 98.9 F (37.2 C) 99.3 F (37.4 C) 98.1 F (36.7 C) 97.8 F (36.6 C)  TempSrc: Oral Oral Oral Oral  SpO2: 96% 99% 98% 100%  Weight:      Height:        General exam: Early male, moderately built, frail and chronically ill looking lying comfortably supine in bed.   Respiratory system: Clear to auscultation.  No increased work of breathing. Cardiovascular system: S1 and S2 heard, RRR.  No JVD, murmurs or pedal edema.   Gastrointestinal system: Abdomen is nondistended, soft and nontender. No organomegaly or masses felt. Normal bowel sounds heard. Central nervous system: Mental status as noted above..  No cranial nerve deficits. Extremities: Symmetric 5 x 5 power.  Left-sided mild intermittent involuntary movements, like myoclonus, mostly of the legs.  These are milder compared to couple days ago Skin: No  rashes, lesions or ulcers Psychiatry: Judgement and insight impaired. Mood & affect cannot be assessed. GU: Foley catheter in place with thigh bag.    Data Reviewed:   I have personally reviewed following labs and imaging studies   CBC: Recent Labs  Lab 04/21/21 1050 04/22/21 0308 04/23/21 0356  WBC 8.2 7.6 7.6  NEUTROABS 6.8  --   --   HGB 9.8* 9.7* 9.8*  HCT 29.9* 29.5* 30.4*  MCV 92.3 91.3 91.8  PLT 200 192 308    Basic Metabolic Panel: Recent Labs  Lab 04/22/21 0308 04/23/21 0356 04/24/21 0341  NA 141 139 139  K 3.5 3.4* 3.5  CL 109 105 106  CO2 '22 23 22  ' GLUCOSE 81 85 81  BUN 30* 27* 26*  CREATININE 1.95* 1.97* 2.07*  CALCIUM 9.4 9.6 9.5    Liver Function Tests: Recent Labs  Lab 04/21/21 1050  AST 16  ALT 12  ALKPHOS 73  BILITOT 0.6  PROT 6.5  ALBUMIN 3.5    CBG: Recent Labs  Lab 04/21/21 1037  GLUCAP 81    Microbiology Studies:   Recent Results (from the past 240 hour(s))  Urine culture     Status: Abnormal   Collection Time: 04/21/21 10:40 AM   Specimen: Urine, Clean Catch  Result Value Ref Range Status   Specimen Description   Final    URINE, CLEAN CATCH Performed at [facility], [place] [address], [place], [place] [address]    Special Requests   Final    NONE Performed at [facility], [place] [address] [place], [place] [address]    Culture (A)  Final    >=100,000 COLONIES/mL CITROBACTER FREUNDII >=100,000 COLONIES/mL AEROCOCCUS SPECIES Standardized susceptibility testing for this organism is not available. Performed at [facility], [place] [address] [place], [place] [address]    Report Status 04/24/2021 FINAL  Final   Organism ID, Bacteria CITROBACTER FREUNDII (A)  Final      Susceptibility   Citrobacter freundii - MIC*    CEFAZOLIN >=64 RESISTANT Resistant     CEFEPIME <=0.12 SENSITIVE Sensitive     CEFTRIAXONE 0.5 SENSITIVE Sensitive     CIPROFLOXACIN 1 SENSITIVE Sensitive      GENTAMICIN <=1 SENSITIVE Sensitive     IMIPENEM 0.5 SENSITIVE Sensitive     NITROFURANTOIN 64 INTERMEDIATE Intermediate     TRIMETH/SULFA >=320 RESISTANT Resistant     PIP/TAZO <=4 SENSITIVE Sensitive     * >=100,000 COLONIES/mL CITROBACTER FREUNDII  Culture, blood (routine x 2)  Status: None   Collection Time: 04/21/21 11:52 AM   Specimen: BLOOD  Result Value Ref Range Status   Specimen Description   Final    BLOOD RIGHT ANTECUBITAL Performed at [place] [address], [place], [place] [address]    Special Requests   Final    BOTTLES DRAWN AEROBIC AND ANAEROBIC Blood Culture adequate volume Performed at [place] [address] [place], [place] [address]    Culture   Final    NO GROWTH 5 DAYS Performed at [facility], [place] [address], [place], [place] [address]    Report Status 04/26/2021 FINAL  Final  Culture, blood (routine x 2)     Status: None   Collection Time: 04/21/21 11:52 AM   Specimen: BLOOD  Result Value Ref Range Status   Specimen Description   Final    BLOOD LEFT ANTECUBITAL Performed at [place] [address] [place], [place] [address]    Special Requests   Final    BOTTLES DRAWN AEROBIC AND ANAEROBIC Blood Culture adequate volume Performed at [place] [address], [place], [place] [address]    Culture   Final    NO GROWTH 5 DAYS Performed at [facility], [place] [address], [place], [place] [address]    Report Status 04/26/2021 FINAL  Final  Resp Panel by RT-PCR (Flu A&B, Covid) Nasopharyngeal Swab     Status: None   Collection Time: 04/21/21 11:58 AM   Specimen: Nasopharyngeal Swab; Nasopharyngeal(NP) swabs in vial transport medium  Result Value Ref Range Status   SARS Coronavirus 2 by RT PCR NEGATIVE NEGATIVE Final    Comment: (NOTE) SARS-CoV-2 target nucleic acids are NOT DETECTED.  The SARS-CoV-2 RNA is generally detectable in upper  respiratory specimens during the acute phase of infection. The lowest concentration of SARS-CoV-2 viral copies this assay can detect is 138 copies/mL. A negative result does not preclude SARS-Cov-2 infection and should not be used as the sole basis for treatment or other patient management decisions. A negative result may occur with  improper specimen collection/handling, submission of specimen other than nasopharyngeal swab, presence of viral mutation(s) within the areas targeted by this assay, and inadequate number of viral copies(<138 copies/mL). A negative result must be combined with clinical observations, patient history, and epidemiological information. The expected result is Negative.  Fact Sheet for Patients:  EntrepreneurPulse.com.au  Fact Sheet for Healthcare Providers:  IncredibleEmployment.be  This test is no t yet approved or cleared by the [place] FDA and  has been authorized for detection and/or diagnosis of SARS-CoV-2 by FDA under an Emergency Use Authorization (EUA). This EUA will remain  in effect (meaning this test can be used) for the duration of the COVID-19 declaration under Section 564(b)(1) of the Act, 21 U.S.C.section 360bbb-3(b)(1), unless the authorization is terminated  or revoked sooner.       Influenza A by PCR NEGATIVE NEGATIVE Final   Influenza B by PCR NEGATIVE NEGATIVE Final    Comment: (NOTE) The Xpert Xpress SARS-CoV-2/FLU/RSV plus assay is intended as an aid in the diagnosis of influenza from Nasopharyngeal swab specimens and should not be used as a sole basis for treatment. Nasal washings and aspirates are unacceptable for Xpert Xpress SARS-CoV-2/FLU/RSV testing.  Fact Sheet for Patients: EntrepreneurPulse.com.au  Fact Sheet for Healthcare Providers: IncredibleEmployment.be  This test is not yet approved or cleared by the [place] and has been  authorized for  detection and/or diagnosis of SARS-CoV-2 by FDA under an Emergency Use Authorization (EUA). This EUA will remain in effect (meaning this test can be used) for the duration of the COVID-19 declaration under Section 564(b)(1) of the Act, 21 U.S.C. section 360bbb-3(b)(1), unless the authorization is terminated or revoked.  Performed at [facility], [place] [address], [place], [place] [address]      Radiology Studies:  No results found.   Scheduled Meds:    Chlorhexidine Gluconate Cloth  6 each Topical Q0600   sodium chloride flush  3 mL Intravenous Q12H   sodium chloride flush  3 mL Intravenous Q12H    Continuous Infusions:    sodium chloride     levETIRAcetam 1,000 mg (04/26/21 1019)     LOS: 5 days     Vernell Leep, MD, [place], [facility]    To contact the attending provider between 7A-7P or the covering provider during after hours 7P-7A, please log into the web site [email or web address] and access using universal [place] password for that web site. If you do not have the password, please call the hospital operator.  04/26/2021, 3:37 PM

## 2021-04-26 NOTE — Progress Notes (Signed)
Attempted to call patient's wife to give her an update after missing a call from her this morning. No answer, and voicemail box was full, unable to leave a message.

## 2021-04-26 NOTE — Progress Notes (Signed)
Daily Progress Note   Patient Name: [name]       Date: 04/26/2021 DOB: [birth year]  Age: 87 y.o. MRN#: [id number] Attending Physician: Modena Jansky, MD Primary Care Physician: Haywood Pao, MD Admit Date: 04/21/2021  Reason for Consultation/Follow-up: Establishing goals of care, Hospice Evaluation, and Terminal Care.    Subjective: Medical records reviewed. Discussed with RN Haylie and assessed patient at the bedside. He is resting comfortably and easily aroused. He denies pain or distress. Unable to have much further conversation before he falls asleep again.  Assessment: Patient with significant alzheimer's and acute metabolic encephalopathy post fall, with multiple bilateral strokes, is awake and pleasantly confused. Patient appears comfortable in no sign of acute distress. Significant anorexia, noticeable continuous myoclonus, and cool extremities. No noticeable work of breathing on room air.    Patient Profile/HPI: 87 y.o. male  with past medical history of prostate cancer, DVT/PE, PVD, CKD, colon cancer, Alzheimer's dementia, chronic foley with recurrent UTIs, PAD who was admitted on 04/21/2021 after being found on the floor by his wife.   Imaging revealed multiple small strokes and a small subarachnoid hemorrhage.  He has been suffering with a continuous jerking thru his abdomen and down his leg.   He appears to have no interest in eating or drinking despite the Nursing staff trying to feed him. Transitioned to comfort measures on 04/25/21.   Length of Stay: 5   Vital Signs: BP (!) 144/91 (BP Location: Right Arm)   Pulse 79   Temp 97.8 F (36.6 C) (Oral)   Resp 18   Ht 5\' 10"  (1.778 m)   Wt 77.1 kg   SpO2 100%   BMI 24.39 kg/m  SpO2: SpO2: 100 % O2 Device: O2  Device: Room Air O2 Flow Rate:         Palliative Assessment/Data: 20% PPS      Palliative Care Plan    Recommendations/Plan: Continue comfort measures, no changes to symptom management at this time Stable for transfer to [facility] when a room becomes available PMT will continue to support holistically  Code Status:  DNR  Prognosis:  < 2 weeks - not taking PO, comfort measures only, multiple bilateral strokes, myoclonus, pleasantly demented.  Discharge Planning: Hospice facility - Awaiting bed at [facility]    Total time:  15 minutes Greater than 50% of this time was spent in counseling and coordinating care related to the above assessment and plan.  Dorthy Cooler, PA-C Palliative Medicine Team Team phone # [phone]  Thank you for allowing the Palliative Medicine Team to assist in the care of this patient. Please utilize secure chat with additional questions, if there is no response within 30 minutes please call the above phone number.  Palliative Medicine Team providers are available by phone from 7am to 7pm daily and can be reached through the team cell phone.  Should this patient require assistance outside of these hours, please call the patient's attending physician.

## 2021-04-26 NOTE — Progress Notes (Addendum)
Manufacturing engineer [facility]) Hospital Liaison note.     [place] is unable to offer a room today. Hospital Liaison will follow up tomorrow or sooner if a room becomes available and eligibility is confirmed.   Please do not hesitate to call with questions.    Thank you,   Farrel Gordon, RN, [place] Hospital Liaison   [phone]

## 2021-04-26 NOTE — Consult Note (Signed)
   [facility] Inpatient Consult   04/26/2021  [name] [birth year] [id number]  [facility] [ACO] Patient: Medicare  High risk score list  Chart reviewed and reveals the patient is currently transitioning to Hospice/Palliative Care with current plan for [facility] noted by Palliative consult.  Given this choice patient will have full case management services through Hospice and needs will be met at the hospice level of care.  Natividad Brood, RN BSN [place] Hospital Liaison  [phone] business mobile phone Toll free office [phone]  Fax number: [phone] [email or web address]

## 2021-04-27 DIAGNOSIS — R339 Retention of urine, unspecified: Secondary | ICD-10-CM

## 2021-04-27 DIAGNOSIS — Y92009 Unspecified place in unspecified non-institutional (private) residence as the place of occurrence of the external cause: Secondary | ICD-10-CM

## 2021-04-27 MED ORDER — LORAZEPAM 2 MG/ML IJ SOLN
1.0000 mg | Freq: Three times a day (TID) | INTRAMUSCULAR | Status: DC
  Administered 2021-04-27 – 2021-04-29 (×3): 1 mg via INTRAVENOUS
  Filled 2021-04-27 (×3): qty 1

## 2021-04-27 NOTE — Plan of Care (Signed)

## 2021-04-27 NOTE — Progress Notes (Addendum)
   Palliative Medicine Inpatient Follow Up Note  Patient Profile/HPI: 87 y.o. male  with past medical history of prostate cancer, DVT/PE, PVD, CKD, colon cancer, Alzheimer's dementia, chronic foley with recurrent UTIs, PAD who was admitted on 04/21/2021 after being found on the floor by his wife.   Imaging revealed multiple small strokes and a small subarachnoid hemorrhage.  He has been suffering with a continuous jerking thru his abdomen and down his leg.   He appears to have no interest in eating or drinking despite the Nursing staff trying to feed him. Transitioned to comfort measures on 04/25/21.  Today's Discussion (04/27/2021):  *Please note that this is a verbal dictation therefore any spelling or grammatical errors are due   Chart reviewed. I met with [name] at bedside, he was twitching quite a bit therefore I added ativan ATC. There was no family present at bedside during the time of assessment.   Patients RN, Davy Pique updated in regards to the medication changes.  Provided a copy of  "Gone From My Site" booklet.  Questions and concerns addressed   Objective Assessment: Vital Signs Vitals:   04/26/21 2026 04/27/21 0800  BP: (!) 140/101 (!) 148/108  Pulse: (!) 106 (!) 102  Resp: 18 18  Temp: 99.9 F (37.7 C) 99.2 F (37.3 C)  SpO2: 97% 97%    Intake/Output Summary (Last 24 hours) at 04/27/2021 1455 Last data filed at 04/27/2021 0522 Gross per 24 hour  Intake --  Output 850 ml  Net -850 ml   Last Weight  Most recent update: 04/21/2021 10:36 AM    Weight  77.1 kg (170 lb)            Gen:  Elderly Caucasian M in mild distress HEENT: Dry mucous membranes CV: Irregular rate and rhythm  PULM:On RA ABD: soft/non-tender EXT: No edema Neuro: Somnolent  SUMMARY OF RECOMMENDATIONS   Continue comfort measures Added Ativan 58m IVP TID [facility] PMT will continue to support holistically  Time Spent: 15 Greater than 50%  of the time was spent in counseling and coordination of care ______________________________________________________________________________________ [facility] Team Cell Phone: [phone] utilize secure chat with additional questions, if there is no response within 30 minutes please call the above phone number  Palliative Medicine Team providers are available by phone from 7am to 7pm daily and can be reached through the team cell phone.  Should this patient require assistance outside of these hours, please call the patient's attending physician.

## 2021-04-27 NOTE — Progress Notes (Signed)
Manufacturing engineer [facility]) Hospital Liaison note.     [place] is unable to offer a room today. Hospital Liaison will follow up tomorrow or sooner if a room becomes available and eligibility is confirmed.   Please do not hesitate to call with questions.     Thank you,   Farrel Gordon, RN, [place] Hospital Liaison   [phone]

## 2021-04-27 NOTE — Progress Notes (Signed)
PROGRESS NOTE  MAR [name] YDX:[id number] DOB: Aug 23, 1931   PCP: Haywood Pao, MD  Patient is from: home  DOA: 04/21/2021 LOS: 6  Chief complaints:  Chief Complaint  Patient presents with   Weakness   Altered Mental Status     Brief Narrative / Interim history: 87 year old M with PMH of HTN, HLD, DVT/PE, CKD-3B, Alzheimer's dementia, recurrent prostate cancer, bladder tumor s/p TURBT, urinary retention with chronic Foley and PAD who presented to [facility] ED after an unwitnessed fall at home.  Wife found patient on the floor and could not get him up.  911 was called.  In the ED patient noted to have seizure-like activity/muscle spasm in left leg, received 1.5 mg of Ativan and loaded with 2 g of IV Keppra and transferred to [facility] ED for evaluation by neurology.  Admitted for seizure-like activity.  Further work-up revealed multiple cardioembolic appearing strokes and small subarachnoid hemorrhage.  Stroke team consulted.  Overall, felt to have poor prognosis and failure to thrive.  Palliative medicine consulted and family opted to transition to full comfort care 7/4 with plans for admission to residential hospice.   Medically ready for DC to [facility] when bed available.  No beds available today.    Subjective: Seen and examined earlier this morning.  No major events overnight of this morning.  No apparent distress.    Objective: Vitals:   04/25/21 2332 04/26/21 0733 04/26/21 2026 04/27/21 0800  BP: (!) 153/103 (!) 144/91 (!) 140/101 (!) 148/108  Pulse: (!) 102 79 (!) 106 (!) 102  Resp: 17 18 18 18   Temp: 98.1 F (36.7 C) 97.8 F (36.6 C) 99.9 F (37.7 C) 99.2 F (37.3 C)  TempSrc: Oral Oral Oral Axillary  SpO2: 98% 100% 97% 97%  Weight:      Height:        Intake/Output Summary (Last 24 hours) at 04/27/2021 1745 Last data filed at 04/27/2021 0522 Gross per 24 hour  Intake --  Output 850 ml  Net -850 ml   Filed Weights   04/21/21 1035  Weight: 77.1 kg     Examination:  GENERAL: Frail and chronically ill-appearing. RESP: On RA.  No IWOB.  Fair aeration bilaterally. CVS:  RRR. Heart sounds normal.  MSK/EXT: Seems to have some left-sided twitching. SKIN: no apparent skin lesion or wound NEURO: Wakes to voice.  Seems to have some left-sided twitching. PSYCH: Calm.  No distress or agitation.  Procedures:  None  Microbiology summarized: HMCNO-70 and influenza PCR nonreactive. Urine culture with Citrobacter freundii and Aerococcus species Blood cultures NGTD.  Assessment & Plan: End-of-life care/comfort measures only -Comfort pathway -Appreciate help by palliative medicine. -Transfer to residential hospice when bed available.  Seizure-like activity: Likely due to acute ischemic CVA/SAH.   -Continue Keppra  -Agree with scheduling Ativan 3 times daily    Acute ischemic strokes/small SAH: Traumatic from fall? Fall at home -Now full comfort measures.  Vitamin B12 deficiency: B12 level 131.  Received parenteral B12 supplements prior to transitioning to full comfort care but no improvement in mental status.  Catheter associated Citrobacter UTI/complicated UTI/recurrent UTI: Initially started on IV cefepime to complete 7 days course, but discontinued after initiating comfort care.   AKI on CKD-3B Recent Labs    11/08/20 0918 02/20/21 1129 04/21/21 1050 04/22/21 0308 04/23/21 0356 04/24/21 0341  BUN 35* 36* 38* 30* 27* 26*  CREATININE 2.04* 1.86* 2.07* 1.95* 1.97* 2.07*    Acute metabolic encephalopathy: Likely due to the above  all..     Hypokalemia: Replaced.   Anemia of chronic disease: Stable  Alzheimer's dementia: No behavioral abnormalities currently.  Essential hypertension: Discontinued meds.  History of DVT/PE: Reportedly provoked after surgery.  VQ scan negative this admission.  History of prostate cancer: Follows with Dr. Gloriann Loan, urology.   Mild dilatation of ascending aorta: 39 mm on echo.     Body  mass index is 24.39 kg/m.         DVT prophylaxis:  Place and maintain sequential compression device Start: 04/22/21 2018  Code Status: DNR/DNI Family Communication: Patient and/or RN. Available if any question.  Level of care: Med-Surg Status is: Inpatient  Remains inpatient appropriate because:Unsafe d/c plan  Dispo: The patient is from: Home              Anticipated d/c is to:  Residential hospice              Patient currently is medically stable for transfer to residential hospice.   Difficult to place patient No       Consultants:  Neurology Palliative medicine   Sch Meds:  Scheduled Meds:  LORazepam  1 mg Intravenous TID   Continuous Infusions:  levETIRAcetam 1,000 mg (04/27/21 1036)   PRN Meds:.acetaminophen **OR** acetaminophen, antiseptic oral rinse, glycopyrrolate **OR** glycopyrrolate **OR** glycopyrrolate, haloperidol **OR** haloperidol **OR** haloperidol lactate, HYDROmorphone (DILAUDID) injection, LORazepam **OR** LORazepam **OR** LORazepam, ondansetron **OR** ondansetron (ZOFRAN) IV, polyvinyl alcohol  Antimicrobials: Anti-infectives (From admission, onward)    Start     Dose/Rate Route Frequency Ordered Stop   04/24/21 0830  ceFEPIme (MAXIPIME) 1 g in sodium chloride 0.9 % 100 mL IVPB  Status:  Discontinued        1 g 200 mL/hr over 30 Minutes Intravenous Every 24 hours 04/24/21 0733 04/25/21 1606   04/21/21 2300  piperacillin-tazobactam (ZOSYN) IVPB 3.375 g  Status:  Discontinued        3.375 g 12.5 mL/hr over 240 Minutes Intravenous Every 8 hours 04/21/21 1434 04/24/21 0733   04/21/21 1500  piperacillin-tazobactam (ZOSYN) IVPB 3.375 g        3.375 g 100 mL/hr over 30 Minutes Intravenous  Once 04/21/21 1434 04/21/21 1641   04/21/21 1115  cefTRIAXone (ROCEPHIN) 1 g in sodium chloride 0.9 % 100 mL IVPB        1 g 200 mL/hr over 30 Minutes Intravenous  Once 04/21/21 1106 04/21/21 1242        I have personally reviewed the following labs and  images: CBC: Recent Labs  Lab 04/21/21 1050 04/22/21 0308 04/23/21 0356  WBC 8.2 7.6 7.6  NEUTROABS 6.8  --   --   HGB 9.8* 9.7* 9.8*  HCT 29.9* 29.5* 30.4*  MCV 92.3 91.3 91.8  PLT 200 192 205   BMP &GFR Recent Labs  Lab 04/21/21 1050 04/22/21 0308 04/23/21 0356 04/24/21 0341  NA 143 141 139 139  K 3.8 3.5 3.4* 3.5  CL 110 109 105 106  CO2 25 22 23 22   GLUCOSE 90 81 85 81  BUN 38* 30* 27* 26*  CREATININE 2.07* 1.95* 1.97* 2.07*  CALCIUM 9.8 9.4 9.6 9.5   Estimated Creatinine Clearance: 26 mL/min (A) (by C-G formula based on SCr of 2.07 mg/dL (H)). Liver & Pancreas: Recent Labs  Lab 04/21/21 1050  AST 16  ALT 12  ALKPHOS 73  BILITOT 0.6  PROT 6.5  ALBUMIN 3.5   No results for input(s): LIPASE, AMYLASE in the last 168 hours. No results  for input(s): AMMONIA in the last 168 hours. Diabetic: No results for input(s): HGBA1C in the last 72 hours. Recent Labs  Lab 04/21/21 1037  GLUCAP 81   Cardiac Enzymes: Recent Labs  Lab 04/21/21 1050  CKTOTAL 145   No results for input(s): PROBNP in the last 8760 hours. Coagulation Profile: No results for input(s): INR, PROTIME in the last 168 hours. Thyroid Function Tests: No results for input(s): TSH, T4TOTAL, FREET4, T3FREE, THYROIDAB in the last 72 hours. Lipid Profile: No results for input(s): CHOL, HDL, LDLCALC, TRIG, CHOLHDL, LDLDIRECT in the last 72 hours. Anemia Panel: No results for input(s): VITAMINB12, FOLATE, FERRITIN, TIBC, IRON, RETICCTPCT in the last 72 hours. Urine analysis:    Component Value Date/Time   COLORURINE YELLOW 04/21/2021 1040   APPEARANCEUR CLOUDY (A) 04/21/2021 1040   LABSPEC 1.013 04/21/2021 1040   PHURINE 6.0 04/21/2021 1040   GLUCOSEU NEGATIVE 04/21/2021 1040   HGBUR LARGE (A) 04/21/2021 1040   BILIRUBINUR NEGATIVE 04/21/2021 1040   KETONESUR NEGATIVE 04/21/2021 1040   PROTEINUR 100 (A) 04/21/2021 1040   UROBILINOGEN 0.2 01/15/2015 1435   NITRITE POSITIVE (A) 04/21/2021  1040   LEUKOCYTESUR LARGE (A) 04/21/2021 1040   Sepsis Labs: Invalid input(s): PROCALCITONIN, [place]  Microbiology: Recent Results (from the past 240 hour(s))  Urine culture     Status: Abnormal   Collection Time: 04/21/21 10:40 AM   Specimen: Urine, Clean Catch  Result Value Ref Range Status   Specimen Description   Final    URINE, CLEAN CATCH Performed at [facility], [place] [address] [place], [place] [address]    Special Requests   Final    NONE Performed at [facility], [place] [address], [place], [place] [address]    Culture (A)  Final    >=100,000 COLONIES/mL CITROBACTER FREUNDII >=100,000 COLONIES/mL AEROCOCCUS SPECIES Standardized susceptibility testing for this organism is not available. Performed at [facility], [place] [address], [place], [place] [address]    Report Status 04/24/2021 FINAL  Final   Organism ID, Bacteria CITROBACTER FREUNDII (A)  Final      Susceptibility   Citrobacter freundii - MIC*    CEFAZOLIN >=64 RESISTANT Resistant     CEFEPIME <=0.12 SENSITIVE Sensitive     CEFTRIAXONE 0.5 SENSITIVE Sensitive     CIPROFLOXACIN 1 SENSITIVE Sensitive     GENTAMICIN <=1 SENSITIVE Sensitive     IMIPENEM 0.5 SENSITIVE Sensitive     NITROFURANTOIN 64 INTERMEDIATE Intermediate     TRIMETH/SULFA >=320 RESISTANT Resistant     PIP/TAZO <=4 SENSITIVE Sensitive     * >=100,000 COLONIES/mL CITROBACTER FREUNDII  Culture, blood (routine x 2)     Status: None   Collection Time: 04/21/21 11:52 AM   Specimen: BLOOD  Result Value Ref Range Status   Specimen Description   Final    BLOOD RIGHT ANTECUBITAL Performed at [place] [address], [place], [place] [address]    Special Requests   Final    BOTTLES DRAWN AEROBIC AND ANAEROBIC Blood Culture adequate volume Performed at [place] [address], [place], [place] [address]    Culture   Final    NO GROWTH 5  DAYS Performed at [facility], [place] [address] [place], [place] [address]    Report Status 04/26/2021 FINAL  Final  Culture, blood (routine x 2)     Status: None   Collection Time: 04/21/21 11:52 AM   Specimen: BLOOD  Result  Value Ref Range Status   Specimen Description   Final    BLOOD LEFT ANTECUBITAL Performed at [place] [address], [place], [place] [address]    Special Requests   Final    BOTTLES DRAWN AEROBIC AND ANAEROBIC Blood Culture adequate volume Performed at [facility] [address], [place], [place] [address]    Culture   Final    NO GROWTH 5 DAYS Performed at [facility], [place] [address] [place], [place] [address]    Report Status 04/26/2021 FINAL  Final  Resp Panel by RT-PCR (Flu A&B, Covid) Nasopharyngeal Swab     Status: None   Collection Time: 04/21/21 11:58 AM   Specimen: Nasopharyngeal Swab; Nasopharyngeal(NP) swabs in vial transport medium  Result Value Ref Range Status   SARS Coronavirus 2 by RT PCR NEGATIVE NEGATIVE Final    Comment: (NOTE) SARS-CoV-2 target nucleic acids are NOT DETECTED.  The SARS-CoV-2 RNA is generally detectable in upper respiratory specimens during the acute phase of infection. The lowest concentration of SARS-CoV-2 viral copies this assay can detect is 138 copies/mL. A negative result does not preclude SARS-Cov-2 infection and should not be used as the sole basis for treatment or other patient management decisions. A negative result may occur with  improper specimen collection/handling, submission of specimen other than nasopharyngeal swab, presence of viral mutation(s) within the areas targeted by this assay, and inadequate number of viral copies(<138 copies/mL). A negative result must be combined with clinical observations, patient history, and epidemiological information. The expected result is Negative.  Fact Sheet for Patients:   EntrepreneurPulse.com.au  Fact Sheet for Healthcare Providers:  IncredibleEmployment.be  This test is no t yet approved or cleared by the [place] FDA and  has been authorized for detection and/or diagnosis of SARS-CoV-2 by FDA under an Emergency Use Authorization (EUA). This EUA will remain  in effect (meaning this test can be used) for the duration of the COVID-19 declaration under Section 564(b)(1) of the Act, 21 U.S.C.section 360bbb-3(b)(1), unless the authorization is terminated  or revoked sooner.       Influenza A by PCR NEGATIVE NEGATIVE Final   Influenza B by PCR NEGATIVE NEGATIVE Final    Comment: (NOTE) The Xpert Xpress SARS-CoV-2/FLU/RSV plus assay is intended as an aid in the diagnosis of influenza from Nasopharyngeal swab specimens and should not be used as a sole basis for treatment. Nasal washings and aspirates are unacceptable for Xpert Xpress SARS-CoV-2/FLU/RSV testing.  Fact Sheet for Patients: EntrepreneurPulse.com.au  Fact Sheet for Healthcare Providers: IncredibleEmployment.be  This test is not yet approved or cleared by the [place] FDA and has been authorized for detection and/or diagnosis of SARS-CoV-2 by FDA under an Emergency Use Authorization (EUA). This EUA will remain in effect (meaning this test can be used) for the duration of the COVID-19 declaration under Section 564(b)(1) of the Act, 21 U.S.C. section 360bbb-3(b)(1), unless the authorization is terminated or revoked.  Performed at [facility], [place] [address] [place], [place] [address]     Radiology Studies: No results found.     Kaylee Wombles T. Ladue  If 7PM-7AM, please contact night-coverage [email or web address] 04/27/2021, 5:45 PM

## 2021-04-28 NOTE — Progress Notes (Signed)
Has been obtunded throughout the shift, until just now.  This nurse was repositioning him and asked him if he were hungry and he said, "I'm not hungry but I sure am thirst. I would love a pepsi".  I got him a cola and he drank about 90 mls and he did tell me his name.  Denies pain but did say his room was cold so I turned up the heat.  He also does grip down when attempting to provide oral care and bit the brush and did swat at me.

## 2021-04-28 NOTE — Progress Notes (Signed)
   Palliative Medicine Inpatient Follow Up Note  Patient Profile/HPI: 87 y.o. male  with past medical history of prostate cancer, DVT/PE, PVD, CKD, colon cancer, Alzheimer's dementia, chronic foley with recurrent UTIs, PAD who was admitted on 04/21/2021 after being found on the floor by his wife.   Imaging revealed multiple small strokes and a small subarachnoid hemorrhage.  He has been suffering with a continuous jerking thru his abdomen and down his leg.   He appears to have no interest in eating or drinking despite the Nursing staff trying to feed him. Transitioned to comfort measures on 04/25/21.  Today's Discussion (04/28/2021):  Chart reviewed. I assessed [name] at bedside. He is resting comfortably in no acute distress. Mouth breathing regularly on room air, without difficulty. Twitching is markedly improved after initiation of scheduled ativan. There was no family present at bedside during the time of assessment.   PMT will continue to support holistically.   Objective Assessment: Vital Signs Vitals:   04/27/21 2009 04/28/21 0853  BP: (!) 159/95 (!) 134/103  Pulse: (!) 105 100  Resp: 20   Temp: 98.9 F (37.2 C) (!) 97.5 F (36.4 C)  SpO2: 99% 100%    Intake/Output Summary (Last 24 hours) at 04/28/2021 0957 Last data filed at 04/28/2021 0436 Gross per 24 hour  Intake --  Output 250 ml  Net -250 ml    Last Weight  Most recent update: 04/21/2021 10:36 AM    Weight  77.1 kg (170 lb)            Gen: Elderly Caucasian M resting comfortably HEENT: Dry mucous membranes CV: regular rate PULM:On RA, breathing is regular and unlabored ABD: soft/non-tender EXT: No edema Neuro: Somnolent  SUMMARY OF RECOMMENDATIONS   Continue comfort measures including Ativan 1mg  IVP TID for seizure-like activity  Patient remains stable for transfer to [facility] when a room becomes available  Time Spent: 15 Greater than 50% of the time was spent in counseling and coordination of  care  Schyler Counsell Burt Knack, [place] Team Team Cell Phone: [phone] Please utilize secure chat with additional questions, if there is no response within 30 minutes please call the above phone number  Palliative Medicine Team providers are available by phone from 7am to 7pm daily and can be reached through the team cell phone.  Should this patient require assistance outside of these hours, please call the patient's attending physician.

## 2021-04-28 NOTE — Plan of Care (Signed)
  Problem: Safety: Goal: Ability to remain free from injury will improve Outcome: Progressing   Problem: Skin Integrity: Goal: Risk for impaired skin integrity will decrease Outcome: Progressing   

## 2021-04-28 NOTE — Progress Notes (Signed)
PROGRESS NOTE  [name] PYK:[id number] DOB: 06-14-1931   PCP: Haywood Pao, MD  Patient is from: home  DOA: 04/21/2021 LOS: 7  Chief complaints:  Chief Complaint  Patient presents with   Weakness   Altered Mental Status     Brief Narrative / Interim history: 87 year old M with PMH of HTN, HLD, DVT/PE, CKD-3B, Alzheimer's dementia, recurrent prostate cancer, bladder tumor s/p TURBT, urinary retention with chronic Foley and PAD who presented to [facility] ED after an unwitnessed fall at home.  Wife found patient on the floor and could not get him up.  911 was called.  In the ED patient noted to have seizure-like activity/muscle spasm in left leg, received 1.5 mg of Ativan and loaded with 2 g of IV Keppra and transferred to [facility] ED for evaluation by neurology.  Admitted for seizure-like activity.  Further work-up revealed multiple cardioembolic appearing strokes and small subarachnoid hemorrhage.  Stroke team consulted.  Overall, felt to have poor prognosis and failure to thrive.  Palliative medicine consulted and family opted to transition to full comfort care 7/4 with plans for admission to residential hospice.   Medically ready for DC to [facility] when bed available.  No beds available today.    Subjective: Seen and examined earlier this morning.  No major events overnight of this morning.  Somewhat somnolent.  Does not appear to be in distress.  Objective: Vitals:   04/26/21 2026 04/27/21 0800 04/27/21 2009 04/28/21 0853  BP: (!) 140/101 (!) 148/108 (!) 159/95 (!) 134/103  Pulse: (!) 106 (!) 102 (!) 105 100  Resp: 18 18 20    Temp: 99.9 F (37.7 C) 99.2 F (37.3 C) 98.9 F (37.2 C) (!) 97.5 F (36.4 C)  TempSrc: Oral Axillary Oral Oral  SpO2: 97% 97% 99% 100%  Weight:      Height:        Intake/Output Summary (Last 24 hours) at 04/28/2021 1142 Last data filed at 04/28/2021 0436 Gross per 24 hour  Intake --  Output 250 ml  Net -250 ml   Filed Weights   04/21/21  1035  Weight: 77.1 kg    Examination:  GENERAL: Frail and chronically ill-appearing. RESP: On RA.  No IWOB.  Fair aeration bilaterally. MSK/EXT: No apparent deformity.  No edema. SKIN: no apparent skin lesion or wound NEURO: Somewhat somnolent.  Left-sided twitching improved. PSYCH: Calm.  No distress or agitation.  Procedures:  None  Microbiology summarized: [id number] and influenza PCR nonreactive. Urine culture with Citrobacter freundii and Aerococcus species Blood cultures NGTD.  Assessment & Plan: End-of-life care/comfort measures only/DNR/DNI -Comfort pathway -Appreciate help by palliative medicine. -Transfer to residential hospice when bed available.  Seizure-like activity: Likely due to acute ischemic CVA/SAH.   -Continue Keppra  -Agree with scheduling Ativan 3 times daily    Acute ischemic strokes/small SAH: Traumatic from fall? Fall at home -Now full comfort measures.  Vitamin B12 deficiency: B12 level 131.  Received parenteral B12 supplements prior to transitioning to full comfort care but no improvement in mental status.  Catheter associated Citrobacter UTI/complicated UTI/recurrent UTI: Initially started on IV cefepime to complete 7 days course, but discontinued after initiating comfort care.   AKI on ALP-3X/TKWIOXBD:  Acute metabolic encephalopathy: Likely due to the above all.   Hypokalemia: Replaced.   Anemia of chronic disease: Stable  Alzheimer's dementia: No behavioral abnormalities currently.  Essential hypertension: Discontinued meds.  History of DVT/PE: Reportedly provoked after surgery.  VQ scan negative this admission.  History  of prostate cancer: Follows with Dr. Gloriann Loan, urology.   Mild dilatation of ascending aorta: 39 mm on echo.     Body mass index is 24.39 kg/m.         DVT prophylaxis:  Place and maintain sequential compression device Start: 04/22/21 2018  Code Status: DNR/DNI Family Communication: Patient and/or RN.  Updated patient's wife over the phone Level of care: Med-Surg Status is: Inpatient  Remains inpatient appropriate because:Unsafe d/c plan  Dispo: The patient is from: Home              Anticipated d/c is to:  Residential hospice              Patient currently is medically stable for transfer to residential hospice.   Difficult to place patient No       Consultants:  Neurology Palliative medicine   Sch Meds:  Scheduled Meds:  LORazepam  1 mg Intravenous TID   Continuous Infusions:  levETIRAcetam 1,000 mg (04/28/21 1003)   PRN Meds:.acetaminophen **OR** acetaminophen, antiseptic oral rinse, glycopyrrolate **OR** glycopyrrolate **OR** glycopyrrolate, haloperidol **OR** haloperidol **OR** haloperidol lactate, HYDROmorphone (DILAUDID) injection, LORazepam **OR** LORazepam **OR** LORazepam, ondansetron **OR** ondansetron (ZOFRAN) IV, polyvinyl alcohol  Antimicrobials: Anti-infectives (From admission, onward)    Start     Dose/Rate Route Frequency Ordered Stop   04/24/21 0830  ceFEPIme (MAXIPIME) 1 g in sodium chloride 0.9 % 100 mL IVPB  Status:  Discontinued        1 g 200 mL/hr over 30 Minutes Intravenous Every 24 hours 04/24/21 0733 04/25/21 1606   04/21/21 2300  piperacillin-tazobactam (ZOSYN) IVPB 3.375 g  Status:  Discontinued        3.375 g 12.5 mL/hr over 240 Minutes Intravenous Every 8 hours 04/21/21 1434 04/24/21 0733   04/21/21 1500  piperacillin-tazobactam (ZOSYN) IVPB 3.375 g        3.375 g 100 mL/hr over 30 Minutes Intravenous  Once 04/21/21 1434 04/21/21 1641   04/21/21 1115  cefTRIAXone (ROCEPHIN) 1 g in sodium chloride 0.9 % 100 mL IVPB        1 g 200 mL/hr over 30 Minutes Intravenous  Once 04/21/21 1106 04/21/21 1242        I have personally reviewed the following labs and images: CBC: Recent Labs  Lab 04/22/21 0308 04/23/21 0356  WBC 7.6 7.6  HGB 9.7* 9.8*  HCT 29.5* 30.4*  MCV 91.3 91.8  PLT 192 205   BMP &GFR Recent Labs  Lab  04/22/21 0308 04/23/21 0356 04/24/21 0341  NA 141 139 139  K 3.5 3.4* 3.5  CL 109 105 106  CO2 22 23 22   GLUCOSE 81 85 81  BUN 30* 27* 26*  CREATININE 1.95* 1.97* 2.07*  CALCIUM 9.4 9.6 9.5   Estimated Creatinine Clearance: 26 mL/min (A) (by C-G formula based on SCr of 2.07 mg/dL (H)). Liver & Pancreas: No results for input(s): AST, ALT, ALKPHOS, BILITOT, PROT, ALBUMIN in the last 168 hours.  No results for input(s): LIPASE, AMYLASE in the last 168 hours. No results for input(s): AMMONIA in the last 168 hours. Diabetic: No results for input(s): HGBA1C in the last 72 hours. No results for input(s): GLUCAP in the last 168 hours.  Cardiac Enzymes: No results for input(s): CKTOTAL, CKMB, CKMBINDEX, TROPONINI in the last 168 hours.  No results for input(s): PROBNP in the last 8760 hours. Coagulation Profile: No results for input(s): INR, PROTIME in the last 168 hours. Thyroid Function Tests: No results for input(s): TSH,  T4TOTAL, FREET4, T3FREE, THYROIDAB in the last 72 hours. Lipid Profile: No results for input(s): CHOL, HDL, LDLCALC, TRIG, CHOLHDL, LDLDIRECT in the last 72 hours. Anemia Panel: No results for input(s): VITAMINB12, FOLATE, FERRITIN, TIBC, IRON, RETICCTPCT in the last 72 hours. Urine analysis:    Component Value Date/Time   COLORURINE YELLOW 04/21/2021 1040   APPEARANCEUR CLOUDY (A) 04/21/2021 1040   LABSPEC 1.013 04/21/2021 1040   PHURINE 6.0 04/21/2021 1040   GLUCOSEU NEGATIVE 04/21/2021 1040   HGBUR LARGE (A) 04/21/2021 1040   BILIRUBINUR NEGATIVE 04/21/2021 1040   KETONESUR NEGATIVE 04/21/2021 1040   PROTEINUR 100 (A) 04/21/2021 1040   UROBILINOGEN 0.2 01/15/2015 1435   NITRITE POSITIVE (A) 04/21/2021 1040   LEUKOCYTESUR LARGE (A) 04/21/2021 1040   Sepsis Labs: Invalid input(s): PROCALCITONIN, [place]  Microbiology: Recent Results (from the past 240 hour(s))  Urine culture     Status: Abnormal   Collection Time: 04/21/21 10:40 AM    Specimen: Urine, Clean Catch  Result Value Ref Range Status   Specimen Description   Final    URINE, CLEAN CATCH Performed at [facility], [place] [address] [place], [place] [address]    Special Requests   Final    NONE Performed at [facility], [place] [address], [place], [place] [address]    Culture (A)  Final    >=100,000 COLONIES/mL CITROBACTER FREUNDII >=100,000 COLONIES/mL AEROCOCCUS SPECIES Standardized susceptibility testing for this organism is not available. Performed at [facility], [place] [address], [place], [place] [address]    Report Status 04/24/2021 FINAL  Final   Organism ID, Bacteria CITROBACTER FREUNDII (A)  Final      Susceptibility   Citrobacter freundii - MIC*    CEFAZOLIN >=64 RESISTANT Resistant     CEFEPIME <=0.12 SENSITIVE Sensitive     CEFTRIAXONE 0.5 SENSITIVE Sensitive     CIPROFLOXACIN 1 SENSITIVE Sensitive     GENTAMICIN <=1 SENSITIVE Sensitive     IMIPENEM 0.5 SENSITIVE Sensitive     NITROFURANTOIN 64 INTERMEDIATE Intermediate     TRIMETH/SULFA >=320 RESISTANT Resistant     PIP/TAZO <=4 SENSITIVE Sensitive     * >=100,000 COLONIES/mL CITROBACTER FREUNDII  Culture, blood (routine x 2)     Status: None   Collection Time: 04/21/21 11:52 AM   Specimen: BLOOD  Result Value Ref Range Status   Specimen Description   Final    BLOOD RIGHT ANTECUBITAL Performed at [place] [address], [place], [place] [address]    Special Requests   Final    BOTTLES DRAWN AEROBIC AND ANAEROBIC Blood Culture adequate volume Performed at [place] [address], [place], [place] [address]    Culture   Final    NO GROWTH 5 DAYS Performed at [facility], [place] [address] [place], [place] [address]    Report Status 04/26/2021 FINAL  Final  Culture, blood (routine x 2)     Status: None   Collection Time: 04/21/21 11:52 AM   Specimen: BLOOD  Result Value Ref Range Status    Specimen Description   Final    BLOOD LEFT ANTECUBITAL Performed at [place] [address], [place], [place] [address]    Special Requests   Final    BOTTLES DRAWN AEROBIC AND ANAEROBIC Blood Culture adequate volume Performed at [place] [address], [place], [place] [address]    Culture   Final    NO  GROWTH 5 DAYS Performed at [facility], [place] [address], [place], [place] [address]    Report Status 04/26/2021 FINAL  Final  Resp Panel by RT-PCR (Flu A&B, Covid) Nasopharyngeal Swab     Status: None   Collection Time: 04/21/21 11:58 AM   Specimen: Nasopharyngeal Swab; Nasopharyngeal(NP) swabs in vial transport medium  Result Value Ref Range Status   SARS Coronavirus 2 by RT PCR NEGATIVE NEGATIVE Final    Comment: (NOTE) SARS-CoV-2 target nucleic acids are NOT DETECTED.  The SARS-CoV-2 RNA is generally detectable in upper respiratory specimens during the acute phase of infection. The lowest concentration of SARS-CoV-2 viral copies this assay can detect is 138 copies/mL. A negative result does not preclude SARS-Cov-2 infection and should not be used as the sole basis for treatment or other patient management decisions. A negative result may occur with  improper specimen collection/handling, submission of specimen other than nasopharyngeal swab, presence of viral mutation(s) within the areas targeted by this assay, and inadequate number of viral copies(<138 copies/mL). A negative result must be combined with clinical observations, patient history, and epidemiological information. The expected result is Negative.  Fact Sheet for Patients:  EntrepreneurPulse.com.au  Fact Sheet for Healthcare Providers:  IncredibleEmployment.be  This test is no t yet approved or cleared by the [place] FDA and  has been authorized for detection and/or diagnosis of SARS-CoV-2 by FDA under an  Emergency Use Authorization (EUA). This EUA will remain  in effect (meaning this test can be used) for the duration of the COVID-19 declaration under Section 564(b)(1) of the Act, 21 U.S.C.section 360bbb-3(b)(1), unless the authorization is terminated  or revoked sooner.       Influenza A by PCR NEGATIVE NEGATIVE Final   Influenza B by PCR NEGATIVE NEGATIVE Final    Comment: (NOTE) The Xpert Xpress SARS-CoV-2/FLU/RSV plus assay is intended as an aid in the diagnosis of influenza from Nasopharyngeal swab specimens and should not be used as a sole basis for treatment. Nasal washings and aspirates are unacceptable for Xpert Xpress SARS-CoV-2/FLU/RSV testing.  Fact Sheet for Patients: EntrepreneurPulse.com.au  Fact Sheet for Healthcare Providers: IncredibleEmployment.be  This test is not yet approved or cleared by the [place] FDA and has been authorized for detection and/or diagnosis of SARS-CoV-2 by FDA under an Emergency Use Authorization (EUA). This EUA will remain in effect (meaning this test can be used) for the duration of the COVID-19 declaration under Section 564(b)(1) of the Act, 21 U.S.C. section 360bbb-3(b)(1), unless the authorization is terminated or revoked.  Performed at [facility], [place] [address], [place], [place] [address]     Radiology Studies: No results found.     Nicholas Ossa T. Dawson  If 7PM-7AM, please contact night-coverage [email or web address] 04/28/2021, 11:42 AM

## 2021-04-28 NOTE — Care Management Important Message (Signed)
Important Message  Patient Details  Name: [name] MRN: [id number] Date of Birth: 21-Jun-1931   Medicare Important Message Given:  Yes     Shelda Altes 04/28/2021, 9:03 AM

## 2021-04-28 NOTE — Progress Notes (Signed)
[facility] ([facility]) Hospital Liaison note.    Chart and pt information have been reviewed by [facility] physician.  Hospice eligibility confirmed.  [facility] is unable to offer a room today. Hospital Liaison will follow up tomorrow or sooner if a room becomes available. Please do not hesitate to call with questions.    Thank you for the opportunity to participate in this patient's care.  Chrislyn King, BSN, RN [facility] Hospital Liaison (listed on AMION under Hospice/[facility])    [phone] (24h on call)  

## 2021-04-29 MED ORDER — GLYCOPYRROLATE 1 MG/5ML PO SOLN: 1.0000 mg | Freq: Four times a day (QID) | ORAL | 0 refills | Status: AC | PRN

## 2021-04-29 MED ORDER — ACETAMINOPHEN 650 MG RE SUPP: 650.0000 mg | Freq: Four times a day (QID) | RECTAL | 0 refills | Status: AC | PRN

## 2021-04-29 MED ORDER — LORAZEPAM 2 MG/ML PO CONC: 1.0000 mg | ORAL | 0 refills | Status: DC | PRN

## 2021-04-29 MED ORDER — LORAZEPAM 2 MG/ML PO CONC: 1.0000 mg | ORAL | 0 refills | Status: AC | PRN

## 2021-04-29 MED ORDER — ONDANSETRON 4 MG PO TBDP: 4.0000 mg | ORAL_TABLET | Freq: Four times a day (QID) | ORAL | 0 refills | Status: AC | PRN

## 2021-04-29 MED ORDER — MORPHINE SULFATE (CONCENTRATE) 10 MG /0.5 ML PO SOLN: 10.0000 mg | ORAL | 0 refills | Status: AC | PRN

## 2021-04-29 NOTE — Plan of Care (Signed)
Problem: Education: Goal: Knowledge of General Education information will improve Description: Including pain rating scale, medication(s)/side effects and non-pharmacologic comfort measures 04/29/2021 2057 by Asencion Partridge, RN Outcome: Adequate for Discharge 04/29/2021 2057 by Asencion Partridge, RN Outcome: Adequate for Discharge   Problem: Health Behavior/Discharge Planning: Goal: Ability to manage health-related needs will improve 04/29/2021 2057 by Asencion Partridge, RN Outcome: Adequate for Discharge 04/29/2021 2057 by Asencion Partridge, RN Outcome: Adequate for Discharge   Problem: Clinical Measurements: Goal: Ability to maintain clinical measurements within normal limits will improve 04/29/2021 2057 by Asencion Partridge, RN Outcome: Adequate for Discharge 04/29/2021 2057 by Asencion Partridge, RN Outcome: Adequate for Discharge Goal: Will remain free from infection 04/29/2021 2057 by Asencion Partridge, RN Outcome: Adequate for Discharge 04/29/2021 2057 by Asencion Partridge, RN Outcome: Adequate for Discharge Goal: Diagnostic test results will improve 04/29/2021 2057 by Asencion Partridge, RN Outcome: Adequate for Discharge 04/29/2021 2057 by Asencion Partridge, RN Outcome: Adequate for Discharge Goal: Respiratory complications will improve 04/29/2021 2057 by Asencion Partridge, RN Outcome: Adequate for Discharge 04/29/2021 2057 by Asencion Partridge, RN Outcome: Adequate for Discharge Goal: Cardiovascular complication will be avoided 04/29/2021 2057 by Asencion Partridge, RN Outcome: Adequate for Discharge 04/29/2021 2057 by Asencion Partridge, RN Outcome: Adequate for Discharge   Problem: Activity: Goal: Risk for activity intolerance will decrease 04/29/2021 2057 by Asencion Partridge, RN Outcome: Adequate for Discharge 04/29/2021 2057 by Asencion Partridge, RN Outcome: Adequate for Discharge   Problem: Nutrition: Goal: Adequate nutrition will be maintained 04/29/2021 2057 by Asencion Partridge, RN Outcome:  Adequate for Discharge 04/29/2021 2057 by Asencion Partridge, RN Outcome: Adequate for Discharge   Problem: Coping: Goal: Level of anxiety will decrease 04/29/2021 2057 by Asencion Partridge, RN Outcome: Adequate for Discharge 04/29/2021 2057 by Asencion Partridge, RN Outcome: Adequate for Discharge   Problem: Elimination: Goal: Will not experience complications related to bowel motility 04/29/2021 2057 by Asencion Partridge, RN Outcome: Adequate for Discharge 04/29/2021 2057 by Asencion Partridge, RN Outcome: Adequate for Discharge Goal: Will not experience complications related to urinary retention 04/29/2021 2057 by Asencion Partridge, RN Outcome: Adequate for Discharge 04/29/2021 2057 by Asencion Partridge, RN Outcome: Adequate for Discharge   Problem: Pain Managment: Goal: General experience of comfort will improve 04/29/2021 2057 by Asencion Partridge, RN Outcome: Adequate for Discharge 04/29/2021 2057 by Asencion Partridge, RN Outcome: Adequate for Discharge   Problem: Safety: Goal: Ability to remain free from injury will improve 04/29/2021 2057 by Asencion Partridge, RN Outcome: Adequate for Discharge 04/29/2021 2057 by Asencion Partridge, RN Outcome: Adequate for Discharge   Problem: Skin Integrity: Goal: Risk for impaired skin integrity will decrease 04/29/2021 2057 by Asencion Partridge, RN Outcome: Adequate for Discharge 04/29/2021 2057 by Asencion Partridge, RN Outcome: Adequate for Discharge   Problem: Acute Rehab PT Goals(only PT should resolve) Goal: Pt Will Go Supine/Side To Sit Outcome: Adequate for Discharge Goal: Patient Will Transfer Sit To/From Stand Outcome: Adequate for Discharge Goal: Pt Will Transfer Bed To Chair/Chair To Bed Outcome: Adequate for Discharge Goal: Pt Will Ambulate Outcome: Adequate for Discharge Goal: Pt Will Go Up/Down Stairs Outcome: Adequate for Discharge Goal: Pt/caregiver will Perform Home Exercise Program Outcome: Adequate for Discharge   Problem: Acute Rehab OT  Goals (only OT should resolve) Goal: Pt. Will Perform Grooming Outcome: Adequate for Discharge Goal: Pt. Will Perform Upper Body Bathing Outcome: Adequate  for Discharge Goal: Pt. Will Perform Upper Body Dressing Outcome: Adequate for Discharge Goal: Pt. Will Transfer To Toilet Outcome: Adequate for Discharge Goal: Pt. Will Perform Toileting-Clothing Manipulation Outcome: Adequate for Discharge

## 2021-04-29 NOTE — TOC Transition Note (Signed)
Transition of Care [facility]) - CM/SW Discharge Note   Patient Details  Name: [name] MRN: [id number] Date of Birth: 12-22-1930  Transition of Care [facility]) CM/SW Contact:  Pollie Friar, RN Phone Number: 04/29/2021, 11:22 AM   Clinical Narrative:    Patient is discharging to [facility] today. Pt will transport via PTAR. Will call and update patients wife.  Number for report: [phone]   Final next level of care: [place] Barriers to Discharge: No Barriers Identified   Patient Goals and CMS Choice Patient states their goals for this hospitalization and ongoing recovery are:: patient unable to participate in goal setting, only oriented to self CMS Medicare.gov Compare Post Acute Care list provided to:: Patient Represenative (must comment) Choice offered to / list presented to : Spouse  Discharge Placement                       Discharge Plan and Services     Post Acute Care Choice: Hospice                               Social Determinants of Health (SDOH) Interventions     Readmission Risk Interventions No flowsheet data found.

## 2021-04-29 NOTE — Progress Notes (Addendum)
[employer]ufacturing [employer] [facility]) Hospital Liaison note.    Addendum: 2:05pm:  TOC Kelli made aware that consents are complete at this time.  Transportation can be arranged at earliest opportunity.   [facility] is able to offer a bed today.  Spoke by phone with wife Merceda Elks, who  is agreeable to transfer today. TOC aware.    [facility] will notify TOC when registration paperwork has been completed to arrange transport.   RN please call report to [phone].  Please be sure the signed [facility] DNR form is ready and transports with the pt.  Thank you for the opportunity to participate in this patient's care.  Domenic Moras, BSN, RN [facility] Liaison (listed on [place] under Hospice/[facility])    [phone] (24h on call)

## 2021-04-29 NOTE — Progress Notes (Signed)
   Palliative Medicine Inpatient Follow Up Note  Patient Profile/HPI: 87 y.o. male  with past medical history of prostate cancer, DVT/PE, PVD, CKD, colon cancer, Alzheimer's dementia, chronic foley with recurrent UTIs, PAD who was admitted on 04/21/2021 after being found on the floor by his wife.   Imaging revealed multiple small strokes and a small subarachnoid hemorrhage.  He has been suffering with a continuous jerking thru his abdomen and down his leg.   He appears to have no interest in eating or drinking despite the Nursing staff trying to feed him. Transitioned to comfort measures on 04/25/21.  Today's Discussion (04/29/2021):  Chart reviewed. Discussed with RN Caryl Pina and assessed [name] at bedside. He is resting comfortably and difficult to arouse with voice or sternal rub, although he did speak briefly with RN earlier this morning. No signs of pain or distress. No family present at the bedside.  PMT will continue to support holistically.   Objective Assessment: Vital Signs Vitals:   04/28/21 1956 04/29/21 0830  BP: (!) 112/92 (!) 161/106  Pulse: 61 68  Resp: 17 18  Temp: 97.8 F (36.6 C) 97.6 F (36.4 C)  SpO2: 98% 92%    Intake/Output Summary (Last 24 hours) at 04/29/2021 1148 Last data filed at 04/29/2021 1771 Gross per 24 hour  Intake 0 ml  Output 400 ml  Net -400 ml    Last Weight  Most recent update: 04/21/2021 10:36 AM    Weight  77.1 kg (170 lb)            Gen: somnolent Elderly Caucasian M in NAD CV: regular rate PULM:On RA, breathing is shallow, regular and unlabored EXT: No edema Skin: dry and cool, mottling over the knees Neuro: Somnolent  SUMMARY OF RECOMMENDATIONS   Continue comfort measures  Patient remains stable for transfer to [employer] [facility] today  Time Spent: 15 minutes Greater than 50% of the time was spent in counseling and coordination of care  Effa Yarrow Burt Knack, [place] Team Team Cell Phone: [phone] Please  utilize secure chat with additional questions, if there is no response within 30 minutes please call the above phone number  Palliative Medicine Team providers are available by phone from 7am to 7pm daily and can be reached through the team cell phone.  Should this patient require assistance outside of these hours, please call the patient's attending physician.

## 2021-04-29 NOTE — Plan of Care (Signed)

## 2021-04-29 NOTE — Progress Notes (Signed)
Discharged to [facility] with IV access and foley cath in place.

## 2021-04-29 NOTE — Discharge Summary (Addendum)
Physician Discharge Summary  [name] PYP:[id number] DOB: 1931/05/02 DOA: 04/21/2021  PCP: Haywood Pao, MD  Admit date: 04/21/2021 Discharge date: 04/29/2021  Admitted From: Home Disposition: Residential hospice at [facility]   Discharge Condition: Stable for transfer CODE STATUS: DNR/DNI   Hospital Course: 87 year old M with PMH of HTN, HLD, DVT/PE, CKD-3B, Alzheimer's dementia, recurrent prostate cancer, bladder tumor s/p TURBT, urinary retention with chronic Foley and PAD who presented to [facility] ED after an unwitnessed fall at home.  Wife found patient on the floor and could not get him up.  911 was called.  In the ED patient noted to have seizure-like activity/muscle spasm in left leg, received 1.5 mg of Ativan and loaded with 2 g of IV Keppra and transferred to [facility] ED for evaluation by neurology.  Admitted for seizure-like activity.  Further work-up revealed multiple cardioembolic appearing strokes and small subarachnoid hemorrhage.  Stroke team consulted.  Overall, felt to have poor prognosis and failure to thrive.  Palliative medicine consulted, and family opted to transition to full comfort care on 7/4 with plans for admission to residential hospice.   Patient transferred to residential hospice at [place] on 04/29/2021.  See individual problem list below for more hospital course. Discharge Diagnoses:  End-of-life care/comfort measures only/DNR/DNI -Comfort pathway -Transfer to residential hospice at [place] activity: Likely due to acute ischemic CVA/SAH.   -Continue Ativan   Acute ischemic strokes/small SAH: Traumatic from fall? Fall at home   Vitamin B12 deficiency: No improvement in mental status with IV vitamin B12   Catheter associated Citrobacter UTI/complicated UTI/recurrent UTI: Initially started on IV cefepime to complete 7 days course, but discontinued after initiating comfort care.   AKI on [id number]:   Acute metabolic  encephalopathy: Likely due to the above all.   Hypokalemia: Replaced.   Anemia of chronic disease: Stable  Alzheimer's dementia: No behavioral abnormalities currently.  Essential hypertension: Discontinued meds.  History of DVT/PE: Reportedly provoked after surgery.  VQ scan negative this admission.  History of prostate cancer: Follows with Dr. Gloriann Loan, urology.   Mild dilatation of ascending aorta: 39 mm on echo.     Body mass index is 24.39 kg/m.            Discharge Exam: Vitals:   04/28/21 1956 04/29/21 0830  BP: (!) 112/92 (!) 161/106  Pulse: 61 68  Resp: 17 18  Temp: 97.8 F (36.6 C) 97.6 F (36.4 C)  SpO2: 98% 92%    GENERAL: Frail and chronically ill-appearing. RESP: On RA.  No IWOB.   CVS:  RRR. Heart sounds normal.  MSK/EXT: Some twitching on the left side NEURO: Awake. PSYCH: Calm.  No distress or agitation.  Discharge Instructions   Allergies as of 04/29/2021       Reactions   Sulfa Antibiotics Rash   Sulfonamide Derivatives Rash        Medication List     STOP taking these medications    acetaminophen 500 MG tablet Commonly known as: TYLENOL Replaced by: acetaminophen 650 MG suppository   allopurinol 300 MG tablet Commonly known as: ZYLOPRIM   aspirin EC 81 MG tablet   carvedilol 6.25 MG tablet Commonly known as: COREG   Fish Oil 1000 MG Caps   hydrochlorothiazide 50 MG tablet Commonly known as: HYDRODIURIL   IRON PO   multivitamin with minerals Tabs tablet       TAKE these medications    acetaminophen 650 MG suppository Commonly known  as: TYLENOL Place 1 suppository (650 mg total) rectally every 6 (six) hours as needed for mild pain (or Fever >/= 101). Replaces: acetaminophen 500 MG tablet   Glycopyrrolate 1 MG/5ML Soln Take 5 mLs (1 mg total) by mouth 4 (four) times daily as needed for up to 3 days.   LORazepam 2 MG/ML concentrated solution Commonly known as: ATIVAN Place 0.5 mLs (1 mg total) under the tongue  every 4 (four) hours as needed for anxiety or seizure.   morphine CONCENTRATE 10 mg / 0.5 ml concentrated solution Take 0.5 mLs (10 mg total) by mouth every 3 (three) hours as needed for moderate pain, severe pain or shortness of breath.   ondansetron 4 MG disintegrating tablet Commonly known as: ZOFRAN-ODT Take 1 tablet (4 mg total) by mouth every 6 (six) hours as needed for nausea.        Consultations: Neurology Palliative medicine  Procedures/Studies:   CT HEAD WO CONTRAST  Result Date: 04/22/2021 CLINICAL DATA:  Subarachnoid hemorrhage EXAM: CT HEAD WITHOUT CONTRAST TECHNIQUE: Contiguous axial images were obtained from the base of the skull through the vertex without intravenous contrast. COMPARISON:  04/21/2021 FINDINGS: Brain: There is a small amount of subarachnoid blood, as demonstrated on MRI. No mass effect. There is generalized atrophy and findings of chronic small vessel disease. Old anterior left temporal lobe infarct. Vascular: No abnormal hyperdensity of the major intracranial arteries or dural venous sinuses. No intracranial atherosclerosis. Skull: The visualized skull base, calvarium and extracranial soft tissues are normal. Sinuses/Orbits: No fluid levels or advanced mucosal thickening of the visualized paranasal sinuses. No mastoid or middle ear effusion. The orbits are normal. IMPRESSION: 1. Small amount of subarachnoid blood, as demonstrated on MRI. No mass effect. 2. Old anterior left temporal lobe infarct. Electronically Signed   By: Ulyses Jarred M.D.   On: 04/22/2021 21:37   CT Head Wo Contrast  Result Date: 04/21/2021 CLINICAL DATA:  87 year old male found down. EXAM: CT HEAD WITHOUT CONTRAST TECHNIQUE: Contiguous axial images were obtained from the base of the skull through the vertex without intravenous contrast. COMPARISON:  Head CT 11/08/2020. FINDINGS: Brain: Stable cerebral volume loss. Chronic encephalomalacia in the left inferior temporal lobe is stable. No  midline shift, ventriculomegaly, mass effect, evidence of mass lesion, intracranial hemorrhage or evidence of cortically based acute infarction. Stable gray-white matter differentiation throughout the brain. Vascular: Mild Calcified atherosclerosis at the skull base. Intracranial artery dolichoectasia. No suspicious intracranial vascular hyperdensity. Skull: Stable and intact. Sinuses/Orbits: Visualized paranasal sinuses and mastoids are stable and well aerated. Other: Several chronic surgical clips along the right posterior scalp convexity are stable. No acute orbit or scalp soft tissue injury identified. IMPRESSION: 1. No acute traumatic injury identified. 2. Stable non contrast CT appearance of the brain with generalized volume loss and chronic left temporal lobe encephalomalacia. Electronically Signed   By: Genevie Ann M.D.   On: 04/21/2021 11:30   MR ANGIO HEAD WO CONTRAST  Result Date: 04/22/2021 CLINICAL DATA:  Subarachnoid hemorrhage EXAM: MRA HEAD WITHOUT CONTRAST TECHNIQUE: Angiographic images of the Circle of Willis were acquired using MRA technique without intravenous contrast. COMPARISON:  No pertinent prior exam. FINDINGS: POSTERIOR CIRCULATION: --Vertebral arteries: Normal --Inferior cerebellar arteries: Normal. --Basilar artery: Normal. --Superior cerebellar arteries: Normal. --Posterior cerebral arteries: Normal. ANTERIOR CIRCULATION: --Intracranial internal carotid arteries: Normal. --Anterior cerebral arteries (ACA): Normal. --Middle cerebral arteries (MCA): Normal. ANATOMIC VARIANTS: None Other: None. IMPRESSION: No aneurysm or intracranial arterial occlusion Electronically Signed   By: Ulyses Jarred  M.D.   On: 04/22/2021 22:04   MR BRAIN WO CONTRAST  Addendum Date: 04/22/2021   ADDENDUM REPORT: 04/22/2021 20:16 ADDENDUM: Critical Value/emergent results were called by telephone at the time of interpretation on 04/22/2021 at 8:15 pm to provider Sal Lorrin Goodell, who verbally acknowledged these  results. Electronically Signed   By: Ulyses Jarred M.D.   On: 04/22/2021 20:16   Result Date: 04/22/2021 CLINICAL DATA:  Encephalopathy. EXAM: MRI HEAD WITHOUT CONTRAST MRI CERVICAL SPINE WITHOUT CONTRAST TECHNIQUE: Multiplanar, multiecho pulse sequences of the brain and surrounding structures, and cervical spine, to include the craniocervical junction and cervicothoracic junction, were obtained without intravenous contrast. COMPARISON:  None. FINDINGS: MRI HEAD FINDINGS Brain: Punctate foci of restriction right cerebellar hemisphere, right temporal lobe and medial left frontal lobe. There is abnormal FLAIR hyperintensity subarachnoid space overlying both lobes and the right cingulate gyrus. There is multifocal hyperintense T2-weighted signal within the white matter. Diffuse, severe atrophy. There is an old left temporal lobe infarct. The midline structures are normal. There is chronic hemosiderin deposition over both anterior hemispheres, right greater than left. Vascular: Major flow voids are preserved. Skull and upper cervical spine: Normal calvarium and skull base. Visualized upper cervical spine and soft tissues are normal. Sinuses/Orbits:No paranasal sinus fluid levels or advanced mucosal thickening. No mastoid or middle ear effusion. Normal orbits. MRI CERVICAL SPINE FINDINGS Truncated and motion degraded examination. Alignment: Physiologic. Vertebrae: No fracture, evidence of discitis, or bone lesion. Cord: Normal signal and morphology. Posterior Fossa, vertebral arteries, paraspinal tissues: Poor visualization of vertebral arteries due to motion. Otherwise unremarkable. Disc levels: Assessment of the disc levels is severely degraded by motion. Within that limitation, there is no high-grade spinal canal stenosis. IMPRESSION: 1. Small volume subarachnoid hemorrhage. 2. Punctate foci of acute ischemia within the right cerebellar hemisphere, right temporal lobe and medial left frontal lobe. 3. Severe atrophy  with findings of chronic small vessel ischemia. 4. No high-grade spinal canal stenosis. Electronically Signed: By: Ulyses Jarred M.D. On: 04/22/2021 20:08   MR CERVICAL SPINE WO CONTRAST  Addendum Date: 04/22/2021   ADDENDUM REPORT: 04/22/2021 20:16 ADDENDUM: Critical Value/emergent results were called by telephone at the time of interpretation on 04/22/2021 at 8:15 pm to provider Sal Lorrin Goodell, who verbally acknowledged these results. Electronically Signed   By: Ulyses Jarred M.D.   On: 04/22/2021 20:16   Result Date: 04/22/2021 CLINICAL DATA:  Encephalopathy. EXAM: MRI HEAD WITHOUT CONTRAST MRI CERVICAL SPINE WITHOUT CONTRAST TECHNIQUE: Multiplanar, multiecho pulse sequences of the brain and surrounding structures, and cervical spine, to include the craniocervical junction and cervicothoracic junction, were obtained without intravenous contrast. COMPARISON:  None. FINDINGS: MRI HEAD FINDINGS Brain: Punctate foci of restriction right cerebellar hemisphere, right temporal lobe and medial left frontal lobe. There is abnormal FLAIR hyperintensity subarachnoid space overlying both lobes and the right cingulate gyrus. There is multifocal hyperintense T2-weighted signal within the white matter. Diffuse, severe atrophy. There is an old left temporal lobe infarct. The midline structures are normal. There is chronic hemosiderin deposition over both anterior hemispheres, right greater than left. Vascular: Major flow voids are preserved. Skull and upper cervical spine: Normal calvarium and skull base. Visualized upper cervical spine and soft tissues are normal. Sinuses/Orbits:No paranasal sinus fluid levels or advanced mucosal thickening. No mastoid or middle ear effusion. Normal orbits. MRI CERVICAL SPINE FINDINGS Truncated and motion degraded examination. Alignment: Physiologic. Vertebrae: No fracture, evidence of discitis, or bone lesion. Cord: Normal signal and morphology. Posterior Fossa, vertebral arteries,  paraspinal tissues: Poor  visualization of vertebral arteries due to motion. Otherwise unremarkable. Disc levels: Assessment of the disc levels is severely degraded by motion. Within that limitation, there is no high-grade spinal canal stenosis. IMPRESSION: 1. Small volume subarachnoid hemorrhage. 2. Punctate foci of acute ischemia within the right cerebellar hemisphere, right temporal lobe and medial left frontal lobe. 3. Severe atrophy with findings of chronic small vessel ischemia. 4. No high-grade spinal canal stenosis. Electronically Signed: By: Ulyses Jarred M.D. On: 04/22/2021 20:08   DG Pelvis Portable  Result Date: 04/21/2021 CLINICAL DATA:  87 year old male found down. EXAM: PORTABLE PELVIS 1-2 VIEWS COMPARISON:  CT Abdomen and Pelvis 08/09/2018. FINDINGS: Portable AP supine view at 1054 hours. Femoral heads are normally located. Pelvis appears stable and intact. Bilateral pelvic sidewall surgical clips are stable. SI joints appear symmetric. Grossly intact proximal femurs. No acute osseous abnormality identified. Negative visible lower abdominal and pelvic visceral contours. IMPRESSION: No acute fracture or dislocation identified about the pelvis. Electronically Signed   By: Genevie Ann M.D.   On: 04/21/2021 11:28   NM Pulmonary Perf and Vent  Result Date: 04/22/2021 CLINICAL DATA:  87 year old male with positive D-dimer. EXAM: NUCLEAR MEDICINE PERFUSION LUNG SCAN TECHNIQUE: Perfusion images were obtained in multiple projections after intravenous injection of radiopharmaceutical. Ventilation scans intentionally deferred if perfusion scan and chest x-ray adequate for interpretation during COVID 19 epidemic. RADIOPHARMACEUTICALS:  4.2 mCi Tc-80m MAA IV COMPARISON:  04/21/2021 chest radiograph FINDINGS: No wedge-shaped perfusion defects are noted. IMPRESSION: Pulmonary embolism absent. Electronically Signed   By: Margarette Canada M.D.   On: 04/22/2021 14:20   DG Chest Portable 1 View  Result Date:  04/21/2021 CLINICAL DATA:  Fall. EXAM: PORTABLE CHEST 1 VIEW COMPARISON:  11/08/2020. FINDINGS: Cardiomegaly. No pulmonary venous congestion. Low lung volumes. Lungs are clear. No pleural effusion or pneumothorax. Right skin fold noted. Degenerative changes scoliosis thoracic spine. IMPRESSION: 1.  Cardiomegaly.  No pulmonary venous congestion. 2.  Low lung volumes.  No acute pulmonary disease. Electronically Signed   By: Marcello Moores  Register   On: 04/21/2021 11:26   EEG adult  Result Date: 04/21/2021 Lora Havens, MD     04/21/2021  3:06 PM Patient Name: DAEQUAN KOZMA MRN: [id number] Epilepsy Attending: Lora Havens Referring Physician/Provider: Clance Boll, NP Date: 04/21/2021 Duration: 24.45 mins Patient history: 87yo M with seizure like activity. EEG to evaluate for seizure. Level of alertness: Awake, asleep AEDs during EEG study: LEV Technical aspects: This EEG study was done with scalp electrodes positioned according to the 10-20 International system of electrode placement. Electrical activity was acquired at a sampling rate of 500Hz  and reviewed with a high frequency filter of 70Hz  and a low frequency filter of 1Hz . EEG data were recorded continuously and digitally stored. Description: No clear posterior dominant rhythm was seen. Sleep was characterized by vertex waves, sleep spindles (12 to 14 Hz), maximal frontocentral region.  EEG showed continuous 5 to 7 Hz theta slowing. Two episodes were noted at  1445 and 1458 during which patient was noted to have side-to-side head movement. Concomitant EEG before, during and after the event did not show any EEG changes suggest seizure. Hyperventilation and photic stimulation were not performed.   ABNORMALITY - Continuous slow, generalized IMPRESSION: This study is suggestive of mild to moderate diffuse encephalopathy, nonspecific etiology.  Two episodes were noted as described above without concomitant EEG change and were not epileptic.  No seizures  or epileptiform discharges were seen throughout the recording. Priyanka Barbra Sarks  ECHOCARDIOGRAM COMPLETE  Result Date: 04/24/2021    ECHOCARDIOGRAM REPORT   Patient Name:   STRIDER VALLANCE Date of Exam: 04/24/2021 Medical Rec #:  [id number]        Height:       70.0 in Accession #:    [id number]       Weight:       170.0 lb Date of Birth:  1931/09/11       BSA:          1.948 m Patient Age:    64 years         BP:           152/100 mmHg Patient Gender: M                HR:           85 bpm. Exam Location:  Inpatient Procedure: 2D Echo, Cardiac Doppler and Color Doppler Indications:    [place]  History:        Patient has no prior history of Echocardiogram examinations.                 Risk Factors:Hypertension.  Sonographer:    Cammy Brochure Referring Phys: [id number] [facility]  Sonographer Comments: Image acquisition challenging due to uncooperative patient. IMPRESSIONS  1. Calcified aortic valve with reduced excursion of left coronary cusp; no significant AS by doppler.  2. Left ventricular ejection fraction, by estimation, is 55 to 60%. The left ventricle has normal function. The left ventricle has no regional wall motion abnormalities. There is mild left ventricular hypertrophy. Left ventricular diastolic parameters are consistent with Grade I diastolic dysfunction (impaired relaxation).  3. Right ventricular systolic function is normal. The right ventricular size is normal.  4. The mitral valve is normal in structure. Trivial mitral valve regurgitation. No evidence of mitral stenosis.  5. The aortic valve is calcified. Aortic valve regurgitation is mild. No aortic stenosis is present.  6. Aortic dilatation noted. There is mild dilatation of the ascending aorta, measuring 39 mm.  7. The inferior vena cava is normal in size with greater than 50% respiratory variability, suggesting right atrial pressure of 3 mmHg. FINDINGS  Left Ventricle: Left ventricular ejection fraction, by estimation, is 55 to 60%.  The left ventricle has normal function. The left ventricle has no regional wall motion abnormalities. The left ventricular internal cavity size was normal in size. There is  mild left ventricular hypertrophy. Left ventricular diastolic parameters are consistent with Grade I diastolic dysfunction (impaired relaxation). Right Ventricle: The right ventricular size is normal.Right ventricular systolic function is normal. Left Atrium: Left atrial size was normal in size. Right Atrium: Right atrial size was normal in size. Pericardium: There is no evidence of pericardial effusion. Mitral Valve: The mitral valve is normal in structure. Trivial mitral valve regurgitation. No evidence of mitral valve stenosis. Tricuspid Valve: The tricuspid valve is normal in structure. Tricuspid valve regurgitation is not demonstrated. No evidence of tricuspid stenosis. Aortic Valve: The aortic valve is calcified. Aortic valve regurgitation is mild. No aortic stenosis is present. Aortic valve mean gradient measures 8.5 mmHg. Aortic valve peak gradient measures 16.0 mmHg. Pulmonic Valve: The pulmonic valve was not well visualized. Pulmonic valve regurgitation is not visualized. No evidence of pulmonic stenosis. Aorta: Aortic dilatation noted. There is mild dilatation of the ascending aorta, measuring 39 mm. Venous: The inferior vena cava is normal in size with greater than 50% respiratory variability, suggesting right atrial pressure of 3  mmHg. IAS/Shunts: The interatrial septum is aneurysmal. No atrial level shunt detected by color flow Doppler. Additional Comments: Calcified aortic valve with reduced excursion of left coronary cusp; no significant AS by doppler.  LEFT VENTRICLE PLAX 2D LVIDd:         4.50 cm  Diastology LVIDs:         3.50 cm  LV e' medial:    5.98 cm/s LV PW:         1.30 cm  LV E/e' medial:  7.5 LV IVS:        1.30 cm  LV e' lateral:   6.64 cm/s LVOT diam:     2.20 cm  LV E/e' lateral: 6.8 LV SV:         77 LV SV Index:    40 LVOT Area:     3.80 cm  RIGHT VENTRICLE RV S prime:     16.30 cm/s TAPSE (M-mode): 1.8 cm LEFT ATRIUM             Index LA diam:        3.40 cm 1.75 cm/m LA Vol (A2C):   67.3 ml 34.55 ml/m LA Vol (A4C):   48.9 ml 25.10 ml/m LA Biplane Vol: 59.1 ml 30.34 ml/m  AORTIC VALVE AV Area (Vmax):    1.77 cm AV Area (Vmean):   1.73 cm AV Area (VTI):     1.75 cm AV Vmax:           200.00 cm/s AV Vmean:          131.500 cm/s AV VTI:            0.440 m AV Peak Grad:      16.0 mmHg AV Mean Grad:      8.5 mmHg LVOT Vmax:         93.30 cm/s LVOT Vmean:        59.700 cm/s LVOT VTI:          0.203 m LVOT/AV VTI ratio: 0.46  AORTA Ao Root diam: 3.70 cm Ao Asc diam:  3.90 cm MITRAL VALVE MV Area (PHT): 2.76 cm    SHUNTS MV Decel Time: 275 msec    Systemic VTI:  0.20 m MV E velocity: 45.10 cm/s  Systemic Diam: 2.20 cm MV A velocity: 78.60 cm/s MV E/A ratio:  0.57 Kirk Ruths MD Electronically signed by Kirk Ruths MD Signature Date/Time: 04/24/2021/11:58:40 AM    Final    VAS US CAROTID  Result Date: 04/25/2021 Carotid Arterial Duplex Study Patient Name:  FINNLEE SILVERNAIL  Date of Exam:   04/23/2021 Medical Rec #: [id number]         Accession #:    [id number] Date of Birth: [birth year]        Patient Gender: M Patient Age:   89Y Exam Location:  [facility] Procedure:      VAS US CAROTID Referring Phys: [id number] [place] --------------------------------------------------------------------------------  Indications:       CVA. Risk Factors:      Hypertension. Limitations        Today's exam was limited due to the patient's respiratory                    variation and patient somnolence, patient movement, patient                    positioning. Comparison Study:  No prior studies. Performing Technologist: Oliver Hum RVT  Examination Guidelines: A complete  evaluation includes B-mode imaging, spectral Doppler, color Doppler, and power Doppler as needed of all accessible portions of each vessel. Bilateral  testing is considered an integral part of a complete examination. Limited examinations for reoccurring indications may be performed as noted.  Right Carotid Findings: +----------+--------+--------+--------+-----------------------+--------+           PSV cm/sEDV cm/sStenosisPlaque Description     Comments +----------+--------+--------+--------+-----------------------+--------+ CCA Prox  70      12              smooth and heterogenoustortuous +----------+--------+--------+--------+-----------------------+--------+ CCA Distal43      11              smooth and heterogenous         +----------+--------+--------+--------+-----------------------+--------+ ICA Prox  40      16              smooth and heterogenous         +----------+--------+--------+--------+-----------------------+--------+ ICA Distal43      12                                     tortuous +----------+--------+--------+--------+-----------------------+--------+ ECA       63      12                                              +----------+--------+--------+--------+-----------------------+--------+ +----------+--------+-------+--------+-------------------+           PSV cm/sEDV cmsDescribeArm Pressure (mmHG) +----------+--------+-------+--------+-------------------+ [id number]                                         +----------+--------+-------+--------+-------------------+ +---------+--------+--+--------+-+---------+ VertebralPSV cm/s23EDV cm/s5Antegrade +---------+--------+--+--------+-+---------+  Left Carotid Findings: +----------+--------+--------+--------+-----------------------+--------+           PSV cm/sEDV cm/sStenosisPlaque Description     Comments +----------+--------+--------+--------+-----------------------+--------+ CCA Prox  55      12              smooth and heterogenous         +----------+--------+--------+--------+-----------------------+--------+ CCA Distal55       11              smooth and heterogenous         +----------+--------+--------+--------+-----------------------+--------+ ICA Prox  44      11              smooth and heterogenous         +----------+--------+--------+--------+-----------------------+--------+ ICA Distal49      8                                      tortuous +----------+--------+--------+--------+-----------------------+--------+ ECA       73      10                                              +----------+--------+--------+--------+-----------------------+--------+ +----------+--------+--------+--------+-------------------+           PSV cm/sEDV cm/sDescribeArm Pressure (mmHG) +----------+--------+--------+--------+-------------------+ Subclavian100                                         +----------+--------+--------+--------+-------------------+ +---------+--------+--+--------+--+---------+  VertebralPSV cm/s63EDV cm/s17Antegrade +---------+--------+--+--------+--+---------+   Summary: Right Carotid: Velocities in the right ICA are consistent with a 1-39% stenosis. Left Carotid: Velocities in the left ICA are consistent with a 1-39% stenosis. Vertebrals: Bilateral vertebral arteries demonstrate antegrade flow. *See table(s) above for measurements and observations.  Electronically signed by Antony Contras MD on 04/25/2021 at 1:28:27 PM.    Final        The results of significant diagnostics from this hospitalization (including imaging, microbiology, ancillary and laboratory) are listed below for reference.     Microbiology: Recent Results (from the past 240 hour(s))  Urine culture     Status: Abnormal   Collection Time: 04/21/21 10:40 AM   Specimen: Urine, Clean Catch  Result Value Ref Range Status   Specimen Description   Final    URINE, CLEAN CATCH Performed at [facility], [place] [address], [place], [place] [address]    Special Requests   Final    NONE Performed  at [facility], [employer] [address], [place], [place] [address]    Culture (A)  Final    >=100,000 COLONIES/mL CITROBACTER FREUNDII >=100,000 COLONIES/mL AEROCOCCUS SPECIES Standardized susceptibility testing for this organism is not available. Performed at [facility], [place] [address] [place], [place] [address]    Report Status 04/24/2021 FINAL  Final   Organism ID, Bacteria CITROBACTER FREUNDII (A)  Final      Susceptibility   Citrobacter freundii - MIC*    CEFAZOLIN >=64 RESISTANT Resistant     CEFEPIME <=0.12 SENSITIVE Sensitive     CEFTRIAXONE 0.5 SENSITIVE Sensitive     CIPROFLOXACIN 1 SENSITIVE Sensitive     GENTAMICIN <=1 SENSITIVE Sensitive     IMIPENEM 0.5 SENSITIVE Sensitive     NITROFURANTOIN 64 INTERMEDIATE Intermediate     TRIMETH/SULFA >=320 RESISTANT Resistant     PIP/TAZO <=4 SENSITIVE Sensitive     * >=100,000 COLONIES/mL CITROBACTER FREUNDII  Culture, blood (routine x 2)     Status: None   Collection Time: 04/21/21 11:52 AM   Specimen: BLOOD  Result Value Ref Range Status   Specimen Description   Final    BLOOD RIGHT ANTECUBITAL Performed at [place] [address] [place], [place] [address]    Special Requests   Final    BOTTLES DRAWN AEROBIC AND ANAEROBIC Blood Culture adequate volume Performed at [facility] [address] [place], [place] [address]    Culture   Final    NO GROWTH 5 DAYS Performed at [facility], [place] [address], [place], [place] [address]    Report Status 04/26/2021 FINAL  Final  Culture, blood (routine x 2)     Status: None   Collection Time: 04/21/21 11:52 AM   Specimen: BLOOD  Result Value Ref Range Status   Specimen Description   Final    BLOOD LEFT ANTECUBITAL Performed at [facility] [address], [place], [place] [address]    Special Requests   Final    BOTTLES DRAWN AEROBIC AND ANAEROBIC Blood Culture adequate  volume Performed at [facility] [address], [place], [place] [address]    Culture   Final    NO GROWTH 5 DAYS Performed at [facility], [place] [address] [place],  [address]    Report Status 04/26/2021 FINAL  Final  Resp Panel by RT-PCR (Flu A&B, Covid) Nasopharyngeal Swab     Status: None   Collection  Time: 04/21/21 11:58 AM   Specimen: Nasopharyngeal Swab; Nasopharyngeal(NP) swabs in vial transport medium  Result Value Ref Range Status   SARS Coronavirus 2 by RT PCR NEGATIVE NEGATIVE Final    Comment: (NOTE) SARS-CoV-2 target nucleic acids are NOT DETECTED.  The SARS-CoV-2 RNA is generally detectable in upper respiratory specimens during the acute phase of infection. The lowest concentration of SARS-CoV-2 viral copies this assay can detect is 138 copies/mL. A negative result does not preclude SARS-Cov-2 infection and should not be used as the sole basis for treatment or other patient management decisions. A negative result may occur with  improper specimen collection/handling, submission of specimen other than nasopharyngeal swab, presence of viral mutation(s) within the areas targeted by this assay, and inadequate number of viral copies(<138 copies/mL). A negative result must be combined with clinical observations, patient history, and epidemiological information. The expected result is Negative.  Fact Sheet for Patients:  EntrepreneurPulse.com.au  Fact Sheet for Healthcare Providers:  IncredibleEmployment.be  This test is no t yet approved or cleared by the [place] FDA and  has been authorized for detection and/or diagnosis of SARS-CoV-2 by FDA under an Emergency Use Authorization (EUA). This EUA will remain  in effect (meaning this test can be used) for the duration of the COVID-19 declaration under Section 564(b)(1) of the Act, 21 U.S.C.section 360bbb-3(b)(1), unless the authorization is  terminated  or revoked sooner.       Influenza A by PCR NEGATIVE NEGATIVE Final   Influenza B by PCR NEGATIVE NEGATIVE Final    Comment: (NOTE) The Xpert Xpress SARS-CoV-2/FLU/RSV plus assay is intended as an aid in the diagnosis of influenza from Nasopharyngeal swab specimens and should not be used as a sole basis for treatment. Nasal washings and aspirates are unacceptable for Xpert Xpress SARS-CoV-2/FLU/RSV testing.  Fact Sheet for Patients: EntrepreneurPulse.com.au  Fact Sheet for Healthcare Providers: IncredibleEmployment.be  This test is not yet approved or cleared by the [place] FDA and has been authorized for detection and/or diagnosis of SARS-CoV-2 by FDA under an Emergency Use Authorization (EUA). This EUA will remain in effect (meaning this test can be used) for the duration of the COVID-19 declaration under Section 564(b)(1) of the Act, 21 U.S.C. section 360bbb-3(b)(1), unless the authorization is terminated or revoked.  Performed at [facility], [place] [address], [place], [place] [address]      Labs:  CBC: Recent Labs  Lab 04/23/21 0356  WBC 7.6  HGB 9.8*  HCT 30.4*  MCV 91.8  PLT 205   BMP &GFR Recent Labs  Lab 04/23/21 0356 04/24/21 0341  NA 139 139  K 3.4* 3.5  CL 105 106  CO2 23 22  GLUCOSE 85 81  BUN 27* 26*  CREATININE 1.97* 2.07*  CALCIUM 9.6 9.5   Estimated Creatinine Clearance: 26 mL/min (A) (by C-G formula based on SCr of 2.07 mg/dL (H)). Liver & Pancreas: No results for input(s): AST, ALT, ALKPHOS, BILITOT, PROT, ALBUMIN in the last 168 hours. No results for input(s): LIPASE, AMYLASE in the last 168 hours. No results for input(s): AMMONIA in the last 168 hours. Diabetic: No results for input(s): HGBA1C in the last 72 hours. No results for input(s): GLUCAP in the last 168 hours. Cardiac Enzymes: No results for input(s): CKTOTAL, CKMB, CKMBINDEX, TROPONINI in the last  168 hours. No results for input(s): PROBNP in the last 8760 hours. Coagulation Profile: No results for input(s): INR, PROTIME in the last 168 hours. Thyroid Function Tests: No results  for input(s): TSH, T4TOTAL, FREET4, T3FREE, THYROIDAB in the last 72 hours. Lipid Profile: No results for input(s): CHOL, HDL, LDLCALC, TRIG, CHOLHDL, LDLDIRECT in the last 72 hours. Anemia Panel: No results for input(s): VITAMINB12, FOLATE, FERRITIN, TIBC, IRON, RETICCTPCT in the last 72 hours. Urine analysis:    Component Value Date/Time   COLORURINE YELLOW 04/21/2021 1040   APPEARANCEUR CLOUDY (A) 04/21/2021 1040   LABSPEC 1.013 04/21/2021 1040   PHURINE 6.0 04/21/2021 1040   GLUCOSEU NEGATIVE 04/21/2021 1040   HGBUR LARGE (A) 04/21/2021 1040   BILIRUBINUR NEGATIVE 04/21/2021 1040   KETONESUR NEGATIVE 04/21/2021 1040   PROTEINUR 100 (A) 04/21/2021 1040   UROBILINOGEN 0.2 01/15/2015 1435   NITRITE POSITIVE (A) 04/21/2021 1040   LEUKOCYTESUR LARGE (A) 04/21/2021 1040   Sepsis Labs: Invalid input(s): PROCALCITONIN, LACTICIDVEN   Time coordinating discharge: 40 minutes  SIGNED:  Mercy Riding, MD  [facility] 04/29/2021, 12:42 PM  If 7PM-7AM, please contact night-coverage [email or web address]

## 2021-05-23 DEATH — deceased
